# Patient Record
Sex: Male | Born: 1945
Health system: Southern US, Community
[De-identification: ages and names within clinical notes are randomized; demographics above are authoritative.]

## PROBLEM LIST (undated history)

## (undated) DIAGNOSIS — F1020 Alcohol dependence, uncomplicated: Secondary | ICD-10-CM

## (undated) DIAGNOSIS — F1290 Cannabis use, unspecified, uncomplicated: Secondary | ICD-10-CM

## (undated) DIAGNOSIS — G4730 Sleep apnea, unspecified: Secondary | ICD-10-CM

## (undated) DIAGNOSIS — I10 Essential (primary) hypertension: Secondary | ICD-10-CM

## (undated) DIAGNOSIS — G4733 Obstructive sleep apnea (adult) (pediatric): Secondary | ICD-10-CM

## (undated) DIAGNOSIS — N529 Male erectile dysfunction, unspecified: Secondary | ICD-10-CM

## (undated) DIAGNOSIS — E785 Hyperlipidemia, unspecified: Secondary | ICD-10-CM

## (undated) DIAGNOSIS — M1990 Unspecified osteoarthritis, unspecified site: Secondary | ICD-10-CM

## (undated) HISTORY — DX: Male erectile dysfunction, unspecified: N52.9

## (undated) HISTORY — PX: APPENDECTOMY: SHX54

## (undated) HISTORY — DX: Sleep apnea, unspecified: G47.30

## (undated) HISTORY — DX: Hyperlipidemia, unspecified: E78.5

## (undated) HISTORY — PX: FRACTURE SURGERY: SHX138

## (undated) HISTORY — DX: Unspecified osteoarthritis, unspecified site: M19.90

## (undated) HISTORY — PX: OTHER SURGICAL HISTORY: SHX169

## (undated) HISTORY — DX: Obstructive sleep apnea (adult) (pediatric): G47.33

## (undated) HISTORY — DX: Essential (primary) hypertension: I10

---
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
## 2015-02-01 ENCOUNTER — Other Ambulatory Visit: Payer: Self-pay | Admitting: Family Medicine

## 2015-03-11 ENCOUNTER — Ambulatory Visit: Payer: Self-pay | Admitting: Family Medicine

## 2015-03-17 DIAGNOSIS — I10 Essential (primary) hypertension: Secondary | ICD-10-CM | POA: Insufficient documentation

## 2015-03-17 DIAGNOSIS — E785 Hyperlipidemia, unspecified: Secondary | ICD-10-CM | POA: Insufficient documentation

## 2015-03-17 DIAGNOSIS — N529 Male erectile dysfunction, unspecified: Secondary | ICD-10-CM | POA: Insufficient documentation

## 2015-03-17 DIAGNOSIS — E782 Mixed hyperlipidemia: Secondary | ICD-10-CM | POA: Insufficient documentation

## 2015-03-21 ENCOUNTER — Encounter: Payer: Self-pay | Admitting: Family Medicine

## 2015-03-21 ENCOUNTER — Ambulatory Visit (INDEPENDENT_AMBULATORY_CARE_PROVIDER_SITE_OTHER): Payer: 59 | Admitting: Family Medicine

## 2015-03-21 VITALS — BP 129/78 | HR 78 | Temp 98.0°F | Ht 70.0 in | Wt 208.0 lb

## 2015-03-21 DIAGNOSIS — N529 Male erectile dysfunction, unspecified: Secondary | ICD-10-CM

## 2015-03-21 DIAGNOSIS — M79604 Pain in right leg: Secondary | ICD-10-CM | POA: Diagnosis not present

## 2015-03-21 DIAGNOSIS — E869 Volume depletion, unspecified: Secondary | ICD-10-CM

## 2015-03-21 DIAGNOSIS — I10 Essential (primary) hypertension: Secondary | ICD-10-CM

## 2015-03-21 DIAGNOSIS — M79605 Pain in left leg: Secondary | ICD-10-CM | POA: Diagnosis not present

## 2015-03-21 DIAGNOSIS — I951 Orthostatic hypotension: Secondary | ICD-10-CM

## 2015-03-21 DIAGNOSIS — E785 Hyperlipidemia, unspecified: Secondary | ICD-10-CM

## 2015-03-21 MED ORDER — TADALAFIL 20 MG PO TABS: ORAL_TABLET | ORAL | Status: DC

## 2015-03-21 MED ORDER — TADALAFIL 5 MG PO TABS: 5.0000 mg | ORAL_TABLET | Freq: Every day | ORAL | Status: DC | PRN

## 2015-03-21 NOTE — Progress Notes (Signed)
BP 129/78 mmHg  Pulse 78  Temp(Src) 98 F (36.7 C)  Ht 5\' 10"  (1.778 m)  Wt 208 lb (94.348 kg)  BMI 29.84 kg/m2  SpO2 96%   Subjective:    Patient ID: James Medina, male    DOB: Jan 14, 1946, 69 y.o.   MRN: [id number]  HPI: James Medina is a 69 y.o. male  Chief Complaint  Patient presents with  . Leg Pain    getting worse. Bilateral legs. Somedays hurts so bad he can't walk. Would like a referral.   He is having leg pain; on and off for a year; some days it hurts so bad that he can't walk Hurts in the calves Exercise actually makes it worse Sitting still makes it feel better Drives to work, about 30 minutes later, tries to get out of the car and feels pain Right leg is worse than the left He is taking the other medicine for his joints, diclofenac He says the diclofenac eases the pain off a little Sometimes it is a sharp pain, fleeting; sometimes when trying to walk, it's not sharp, just painful Has been so signficant he had to leave work one day Massage does not make it feel better at all Patient has not felt a bad cramp in his calf since childhood He actually stopped the statin, thinking that the medicine might be the cause and it really didn't help when the medicine was stopped  Father gets cramps in his legs; vinegar helps his dad  He has high cholesterol  His blood pressure, but felt dizzy in his shop a few weeks ago it was 83/43, went up to 87/50 something; no chance of taking an extra dose of BP medicine; no fevers or chills, but did break out in a sweat; no chest pain; no funny heart rhythm; no stroke-like symptoms; not happened again; held BP meds for a few days and did fine  Relevant past medical, surgical, family and social history reviewed and updated as indicated. Interim medical history since our last visit reviewed. Allergies and medications reviewed and updated. He used to smoke, quit cigars a month ago  He lost some weight being hot and outside these  last few months  Review of Systems Per HPI unless specifically indicated above     Objective:    BP 129/78 mmHg  Pulse 78  Temp(Src) 98 F (36.7 C)  Ht 5\' 10"  (1.778 m)  Wt 208 lb (94.348 kg)  BMI 29.84 kg/m2  SpO2 96%  Wt Readings from Last 3 Encounters:  03/21/15 208 lb (94.348 kg)  11/01/14 217 lb (98.431 kg)    Physical Exam  Constitutional: He appears well-developed and well-nourished.  HENT:  Head: Normocephalic and atraumatic.  Eyes: EOM are normal.  Cardiovascular: Normal rate and regular rhythm.   Pulses:      Dorsalis pedis pulses are 1+ on the right side, and 1+ on the left side.       Posterior tibial pulses are 1+ on the right side, and 1+ on the left side.  Feet are warm and well-perfused  Pulmonary/Chest: Effort normal and breath sounds normal.  Musculoskeletal: He exhibits no edema.  Neurological: He is alert. He displays normal reflexes. He exhibits normal muscle tone.  No lower extremity weakness  Skin: Skin is warm and dry. No pallor.  Toenails are rather pale  Psychiatric: He has a normal mood and affect. His behavior is normal. Judgment and thought content normal.    No results found  for this or any previous visit.    Assessment & Plan:   Problem List Items Addressed This Visit      Cardiovascular and Mediastinum   Hypertension    Well-controlled today; continue ACE-I / thiazide combination; limit sodium      Relevant Medications   tadalafil (CIALIS) 20 MG tablet   tadalafil (CIALIS) 5 MG tablet     Genitourinary   ED (erectile dysfunction)    Rx for daily cialis OR he can use on-demand cialis, NOT BOTH; there is a savings card and he is welcome to use that for whichever one (the daily OR on-demand) is the least expensive option for him        Other   Hyperlipidemia    Patient to start back on his statin and recheck lipids and sgpt in 3 months; limit saturated fats      Relevant Medications   tadalafil (CIALIS) 20 MG tablet    tadalafil (CIALIS) 5 MG tablet   Bilateral leg pain - Primary    Discussed ddx with patient; I don't think this is neurologic or musculoskeletal; I suspect a vascular etiology; pulses found with hand-held doppler; will refer to vascular specialist to evaluate for possible venous etiology, though arterial insufficiency still a possibility given his ED and high cholesterol       Other Visit Diagnoses    Volume depletion        sounds as though patient had significant volume depletion episode a few weeks ago; asymptomatic now; encouraged adequate hydration    Orthostatic hypotension        a few weeks ago, encouraged adequate hydration    Relevant Medications    tadalafil (CIALIS) 20 MG tablet    tadalafil (CIALIS) 5 MG tablet        Follow up plan: No Follow-up on file.

## 2015-03-21 NOTE — Patient Instructions (Addendum)
We'll refer you to the vascular doctor for evaluation of the leg pain Do consider going back on half of your previous dose of the cholesterol medicine and then check fasting labs 3 months later along with liver enzyme Start back on 81 mg coated aspirin daily Healthy eating encouraged If you have not heard anything from my staff in a week about any orders/referrals/studies from today, please contact us here to follow-up [phone]

## 2015-03-21 NOTE — Assessment & Plan Note (Addendum)
Rx for daily cialis OR he can use on-demand cialis, NOT BOTH; there is a savings card and he is welcome to use that for whichever one (the daily OR on-demand) is the least expensive option for him

## 2015-03-25 NOTE — Assessment & Plan Note (Signed)
Patient to start back on his statin and recheck lipids and sgpt in 3 months; limit saturated fats

## 2015-03-25 NOTE — Assessment & Plan Note (Signed)
Discussed ddx with patient; I don't think this is neurologic or musculoskeletal; I suspect a vascular etiology; pulses found with hand-held doppler; will refer to vascular specialist to evaluate for possible venous etiology, though arterial insufficiency still a possibility given his ED and high cholesterol

## 2015-03-25 NOTE — Assessment & Plan Note (Signed)
Well-controlled today; continue ACE-I / thiazide combination; limit sodium

## 2015-04-01 ENCOUNTER — Telehealth: Payer: Self-pay | Admitting: Family Medicine

## 2015-04-01 NOTE — Telephone Encounter (Signed)
Pt calling about vascular appointment, said he was told to call back within a week if he had not heard anything.  Please call him.

## 2015-04-05 ENCOUNTER — Other Ambulatory Visit: Payer: Self-pay | Admitting: Family Medicine

## 2015-04-05 NOTE — Telephone Encounter (Signed)
May 2016 labs reviewed; GFR 97, K+ 3.8; Rx approved

## 2015-04-21 ENCOUNTER — Telehealth: Payer: Self-pay | Admitting: Family Medicine

## 2015-04-21 DIAGNOSIS — M79604 Pain in right leg: Secondary | ICD-10-CM

## 2015-04-21 DIAGNOSIS — M79605 Pain in left leg: Secondary | ICD-10-CM

## 2015-04-21 MED ORDER — TRAMADOL HCL 50 MG PO TABS: 50.0000 mg | ORAL_TABLET | Freq: Four times a day (QID) | ORAL | Status: DC | PRN

## 2015-04-21 NOTE — Assessment & Plan Note (Signed)
Refer to vascular 

## 2015-04-21 NOTE — Telephone Encounter (Signed)
I talked with the patient; I apologized for the delay in the vascular surgery referral; I take full responsibility and have talked with my staff about the fail-safe mechanism which did not work with him; I do appreciate that he called on October 11th, but the message never came to me and it wasn't acted on; I will have further discussion with staff I very much want to get him in to see vascular doctor; if symptoms from arterial issue or venous issue, those are treated differently; if not arterial or venous, then we have to keep looking, spinal stenosis, other problems; he agrees I offered pain medicine; will try tramadol; he may have some at home that his dog was prescribed; I can NOT authorize for him to use that, and will call in Rx right now so he can pick this up tonight; he agrees

## 2015-04-21 NOTE — Telephone Encounter (Signed)
I called, left msg for patient

## 2015-04-21 NOTE — Telephone Encounter (Signed)
Good Morning Dr Sanda Klein  Mr James Medina called this morning again and expressed that he was supposed to be scheduled for an u/s of his leg. So read his note from 03/21/15 and you did stated that information in his note however I did not received an order nor referral to complete his referral. Just wanted to let you know. So I have placed the order in the box to be signed for his ultrasound just choose which request you would like and sign at the bottom. Once I get it I will make sure I call and go ahead and get him schedule since it has been a month. You may have forgotten to sign the order itself because you definitely documented in your notes that you were going to order the exam.

## 2015-05-28 ENCOUNTER — Telehealth: Payer: Self-pay

## 2015-05-29 ENCOUNTER — Other Ambulatory Visit: Payer: Self-pay | Admitting: Family Medicine

## 2015-05-29 DIAGNOSIS — E785 Hyperlipidemia, unspecified: Secondary | ICD-10-CM

## 2015-05-29 DIAGNOSIS — Z5181 Encounter for therapeutic drug level monitoring: Secondary | ICD-10-CM

## 2015-05-29 DIAGNOSIS — Z125 Encounter for screening for malignant neoplasm of prostate: Secondary | ICD-10-CM

## 2015-05-29 NOTE — Telephone Encounter (Signed)
rx

## 2015-05-29 NOTE — Telephone Encounter (Signed)
done

## 2015-05-30 DIAGNOSIS — Z5181 Encounter for therapeutic drug level monitoring: Secondary | ICD-10-CM | POA: Insufficient documentation

## 2015-05-30 DIAGNOSIS — Z125 Encounter for screening for malignant neoplasm of prostate: Secondary | ICD-10-CM | POA: Insufficient documentation

## 2015-05-30 NOTE — Assessment & Plan Note (Signed)
Check labs 

## 2015-05-30 NOTE — Telephone Encounter (Signed)
I have sent refill as requested I put in fasting lab orders for patient Please ask patient to come in some time in the next week or two and we'll get those labs done for him before the end of the year One lab I ordered is a PSA; he is welcome to have that done or decline it (you can cancel if he declines it); it screens for prostate cancer

## 2015-05-30 NOTE — Telephone Encounter (Signed)
Patient's wife notified to remind him to come in for labs.

## 2015-06-06 ENCOUNTER — Other Ambulatory Visit: Payer: 59

## 2015-06-06 DIAGNOSIS — Z5181 Encounter for therapeutic drug level monitoring: Secondary | ICD-10-CM

## 2015-06-06 DIAGNOSIS — E785 Hyperlipidemia, unspecified: Secondary | ICD-10-CM

## 2015-06-06 DIAGNOSIS — Z125 Encounter for screening for malignant neoplasm of prostate: Secondary | ICD-10-CM

## 2015-06-07 LAB — COMPREHENSIVE METABOLIC PANEL
ALBUMIN: 4.3 g/dL (ref 3.6–4.8)
ALK PHOS: 91 IU/L (ref 39–117)
ALT: 23 IU/L (ref 0–44)
AST: 27 IU/L (ref 0–40)
Albumin/Globulin Ratio: 1.8 (ref 1.1–2.5)
BUN / CREAT RATIO: 16 (ref 10–22)
BUN: 14 mg/dL (ref 8–27)
Bilirubin Total: 0.9 mg/dL (ref 0.0–1.2)
CO2: 25 mmol/L (ref 18–29)
CREATININE: 0.85 mg/dL (ref 0.76–1.27)
Calcium: 9.2 mg/dL (ref 8.6–10.2)
Chloride: 102 mmol/L (ref 96–106)
GFR calc non Af Amer: 90 mL/min/{1.73_m2} (ref >59)
GFR, EST AFRICAN AMERICAN: 104 mL/min/{1.73_m2} (ref >59)
GLUCOSE: 127 mg/dL — AB (ref 65–99)
Globulin, Total: 2.4 g/dL (ref 1.5–4.5)
Potassium: 4.2 mmol/L (ref 3.5–5.2)
Sodium: 142 mmol/L (ref 134–144)
TOTAL PROTEIN: 6.7 g/dL (ref 6.0–8.5)

## 2015-06-07 LAB — CBC WITH DIFFERENTIAL/PLATELET
Basophils Absolute: 0 10*3/uL (ref 0.0–0.2)
Basos: 1 %
EOS (ABSOLUTE): 0.1 10*3/uL (ref 0.0–0.4)
EOS: 2 %
HEMATOCRIT: 42.8 % (ref 37.5–51.0)
HEMOGLOBIN: 14.8 g/dL (ref 12.6–17.7)
IMMATURE GRANS (ABS): 0 10*3/uL (ref 0.0–0.1)
Immature Granulocytes: 0 %
LYMPHS ABS: 1.4 10*3/uL (ref 0.7–3.1)
LYMPHS: 25 %
MCH: 33.5 pg — ABNORMAL HIGH (ref 26.6–33.0)
MCHC: 34.6 g/dL (ref 31.5–35.7)
MCV: 97 fL (ref 79–97)
MONOCYTES: 6 %
Monocytes Absolute: 0.3 10*3/uL (ref 0.1–0.9)
Neutrophils Absolute: 3.6 10*3/uL (ref 1.4–7.0)
Neutrophils: 66 %
Platelets: 240 10*3/uL (ref 150–379)
RBC: 4.42 x10E6/uL (ref 4.14–5.80)
RDW: 12.3 % (ref 12.3–15.4)
WBC: 5.4 10*3/uL (ref 3.4–10.8)

## 2015-06-07 LAB — PSA: Prostate Specific Ag, Serum: 0.9 ng/mL (ref 0.0–4.0)

## 2015-06-07 LAB — LIPID PANEL W/O CHOL/HDL RATIO
Cholesterol, Total: 160 mg/dL (ref 100–199)
HDL: 89 mg/dL (ref >39)
LDL CALC: 61 mg/dL (ref 0–99)
TRIGLYCERIDES: 49 mg/dL (ref 0–149)
VLDL CHOLESTEROL CAL: 10 mg/dL (ref 5–40)

## 2015-06-08 ENCOUNTER — Other Ambulatory Visit: Payer: Self-pay | Admitting: Family Medicine

## 2015-06-08 ENCOUNTER — Encounter: Payer: Self-pay | Admitting: Family Medicine

## 2015-06-08 DIAGNOSIS — R7989 Other specified abnormal findings of blood chemistry: Secondary | ICD-10-CM

## 2015-06-08 DIAGNOSIS — R739 Hyperglycemia, unspecified: Secondary | ICD-10-CM

## 2015-06-08 NOTE — Assessment & Plan Note (Signed)
127 in December 2016; recheck fasting in 2-3 weeks; see letter dated Jun 08, 2015

## 2015-06-08 NOTE — Assessment & Plan Note (Signed)
Elevated MCH; recheck CBC in 3 months (mid-March 2017); may be alcohol effect; if still elevated, check 123456, folic acid, UPEP, SPEP

## 2015-06-08 NOTE — Assessment & Plan Note (Signed)
Recheck glucose fasting with A1c in late Dec 2016 or early Jan 2017

## 2015-06-08 NOTE — Assessment & Plan Note (Signed)
Check CBC in mid-March 2017

## 2015-06-09 ENCOUNTER — Other Ambulatory Visit: Payer: Self-pay

## 2015-06-09 MED ORDER — DICLOFENAC SODIUM 50 MG PO TBEC: DELAYED_RELEASE_TABLET | ORAL | Status: DC

## 2015-06-09 NOTE — Telephone Encounter (Signed)
Routing to provider  

## 2015-06-09 NOTE — Telephone Encounter (Signed)
Jun 06, 2015 labs reviewed; rx approved

## 2015-06-26 ENCOUNTER — Other Ambulatory Visit: Payer: Self-pay | Admitting: Family Medicine

## 2015-06-26 NOTE — Telephone Encounter (Signed)
Jun 06, 2015 labs reviewed (lipids and SGPT); Rx approved

## 2015-07-30 ENCOUNTER — Other Ambulatory Visit: Payer: Self-pay | Admitting: Family Medicine

## 2015-07-30 NOTE — Telephone Encounter (Signed)
Dec 2016 labs reviewed; Rx approved; last BP reviewed as well

## 2015-08-29 ENCOUNTER — Ambulatory Visit: Payer: 59 | Admitting: Family Medicine

## 2015-09-05 ENCOUNTER — Ambulatory Visit (INDEPENDENT_AMBULATORY_CARE_PROVIDER_SITE_OTHER): Payer: 59 | Admitting: Family Medicine

## 2015-09-05 ENCOUNTER — Encounter: Payer: Self-pay | Admitting: Family Medicine

## 2015-09-05 VITALS — BP 122/73 | HR 87 | Temp 99.1°F | Ht 70.5 in | Wt 206.0 lb

## 2015-09-05 DIAGNOSIS — R361 Hematospermia: Secondary | ICD-10-CM | POA: Diagnosis not present

## 2015-09-05 DIAGNOSIS — Z789 Other specified health status: Secondary | ICD-10-CM

## 2015-09-05 DIAGNOSIS — F1090 Alcohol use, unspecified, uncomplicated: Secondary | ICD-10-CM

## 2015-09-05 DIAGNOSIS — Z5181 Encounter for therapeutic drug level monitoring: Secondary | ICD-10-CM

## 2015-09-05 DIAGNOSIS — E785 Hyperlipidemia, unspecified: Secondary | ICD-10-CM | POA: Diagnosis not present

## 2015-09-05 DIAGNOSIS — R310 Gross hematuria: Secondary | ICD-10-CM | POA: Diagnosis not present

## 2015-09-05 DIAGNOSIS — I10 Essential (primary) hypertension: Secondary | ICD-10-CM

## 2015-09-05 DIAGNOSIS — R7989 Other specified abnormal findings of blood chemistry: Secondary | ICD-10-CM

## 2015-09-05 DIAGNOSIS — R739 Hyperglycemia, unspecified: Secondary | ICD-10-CM

## 2015-09-05 DIAGNOSIS — R252 Cramp and spasm: Secondary | ICD-10-CM

## 2015-09-05 DIAGNOSIS — Z1159 Encounter for screening for other viral diseases: Secondary | ICD-10-CM

## 2015-09-05 DIAGNOSIS — M79604 Pain in right leg: Secondary | ICD-10-CM

## 2015-09-05 DIAGNOSIS — M79605 Pain in left leg: Secondary | ICD-10-CM | POA: Diagnosis not present

## 2015-09-05 DIAGNOSIS — Z7289 Other problems related to lifestyle: Secondary | ICD-10-CM

## 2015-09-05 LAB — UA/M W/RFLX CULTURE, ROUTINE
Bilirubin, UA: NEGATIVE
Glucose, UA: NEGATIVE
Ketones, UA: NEGATIVE
Leukocytes, UA: NEGATIVE
NITRITE UA: NEGATIVE
PH UA: 7 (ref 5.0–7.5)
Protein, UA: NEGATIVE
Specific Gravity, UA: 1.02 (ref 1.005–1.030)
UUROB: 0.2 mg/dL (ref 0.2–1.0)

## 2015-09-05 LAB — MICROSCOPIC EXAMINATION
EPITHELIAL CELLS (NON RENAL): NONE SEEN /HPF (ref 0–10)
WBC UA: NONE SEEN /HPF (ref 0–?)

## 2015-09-05 NOTE — Assessment & Plan Note (Signed)
Recheck CBC. 

## 2015-09-05 NOTE — Progress Notes (Signed)
BP 122/73 mmHg  Pulse 87  Temp(Src) 99.1 F (37.3 C)  Ht 5' 10.5" (1.791 m)  Wt 206 lb (93.441 kg)  BMI 29.13 kg/m2  SpO2 98%   Subjective:    Patient ID: James Medina, male    DOB: 05-01-1946, 70 y.o.   MRN: [id number]  HPI: [name] is a 70 y.o. male  Chief Complaint  Patient presents with  . Hypertension    follow up and labs  . Hyperlipidemia    follow up and labs  . Leg Pain    bilateral, he went to Dr.Dew and he said circulation was fine but it's still hurting him    He wants a good orthopaedic surgeon to check his legs; vascular surgeon thought his circulation was fine; when the pain starts behind the knee and goes down the legs; both sides; sometimes just one leg, sometimes both; used to cross his legs a lot, but not much anymore; feet don't get cold; just some pain on the left side down low, lasted about a week, just one spot; might have seen some blood; brothers and father have kidney stones; legs do feel heavy when walking for a long time; left knee is "bad", pops on him sometimes; played baseball and pitched; knot in the right wrist; legs bother him at night; the tramdol made him itch so he stopped taking that; Dr. Lucky Cowboy gave him hydrocodone which helped; left calf really knotted up the other night; only one really bad Charley horse in the right calf once, so painful he could hardly scream  Just saw the one time blood in urine recently; has had hematospermia over last few years; had vasectomy 22 years ago, but blood in ejaculate just started a few years ago; no pain with urination or ejaculation; no pain in straddle  High cholesterol; tries to limit fats, does eat sausage egg and cheese biscuit sometimes though; at home, very rare meats, wife is vegan  High blood pressure; controlled; checks at home and it's 120-130 on top; no added salt  Alcohol, 6-12 pack a day, but not every day; spreading alcohol out, back off when high; no abdominal pain, no  jaundice  Relevant past medical, surgical, family and social history reviewed and updated as indicated. Interim medical history since our last visit reviewed. Allergies and medications reviewed and updated.  Review of Systems Per HPI unless specifically indicated above     Objective:    BP 122/73 mmHg  Pulse 87  Temp(Src) 99.1 F (37.3 C)  Ht 5' 10.5" (1.791 m)  Wt 206 lb (93.441 kg)  BMI 29.13 kg/m2  SpO2 98%  Wt Readings from Last 3 Encounters:  09/05/15 206 lb (93.441 kg)  03/21/15 208 lb (94.348 kg)  11/01/14 217 lb (98.431 kg)    Physical Exam  Constitutional: He appears well-developed and well-nourished. No distress.  overweight  HENT:  Head: Normocephalic and atraumatic.  Eyes: EOM are normal. No scleral icterus.  Neck: No thyromegaly present.  Cardiovascular: Normal rate and regular rhythm.   Pulmonary/Chest: Effort normal and breath sounds normal.  Abdominal: Soft. Bowel sounds are normal. He exhibits no distension and no mass. There is no hepatomegaly. There is no tenderness.  Musculoskeletal: He exhibits no edema.  Neurological: Coordination normal.  Skin: Skin is warm and dry. No pallor.  Ruddy complexion, some dilated capillaries on face  Psychiatric: He has a normal mood and affect. His behavior is normal. Judgment and thought content normal.  Results for orders placed or performed in visit on 06/06/15  PSA  Result Value Ref Range   Prostate Specific Ag, Serum 0.9 0.0 - 4.0 ng/mL  Comprehensive metabolic panel  Result Value Ref Range   Glucose 127 (H) 65 - 99 mg/dL   BUN 14 8 - 27 mg/dL   Creatinine, Ser 0.85 0.76 - 1.27 mg/dL   GFR calc non Af Amer 90 >59 mL/min/1.73   GFR calc Af Amer 104 >59 mL/min/1.73   BUN/Creatinine Ratio 16 10 - 22   Sodium 142 134 - 144 mmol/L   Potassium 4.2 3.5 - 5.2 mmol/L   Chloride 102 96 - 106 mmol/L   CO2 25 18 - 29 mmol/L   Calcium 9.2 8.6 - 10.2 mg/dL   Total Protein 6.7 6.0 - 8.5 g/dL   Albumin 4.3 3.6 -  4.8 g/dL   Globulin, Total 2.4 1.5 - 4.5 g/dL   Albumin/Globulin Ratio 1.8 1.1 - 2.5   Bilirubin Total 0.9 0.0 - 1.2 mg/dL   Alkaline Phosphatase 91 39 - 117 IU/L   AST 27 0 - 40 IU/L   ALT 23 0 - 44 IU/L  CBC with Differential/Platelet  Result Value Ref Range   WBC 5.4 3.4 - 10.8 x10E3/uL   RBC 4.42 4.14 - 5.80 x10E6/uL   Hemoglobin 14.8 12.6 - 17.7 g/dL   Hematocrit 42.8 37.5 - 51.0 %   MCV 97 79 - 97 fL   MCH 33.5 (H) 26.6 - 33.0 pg   MCHC 34.6 31.5 - 35.7 g/dL   RDW 12.3 12.3 - 15.4 %   Platelets 240 150 - 379 x10E3/uL   Neutrophils 66 %   Lymphs 25 %   Monocytes 6 %   Eos 2 %   Basos 1 %   Neutrophils Absolute 3.6 1.4 - 7.0 x10E3/uL   Lymphocytes Absolute 1.4 0.7 - 3.1 x10E3/uL   Monocytes Absolute 0.3 0.1 - 0.9 x10E3/uL   EOS (ABSOLUTE) 0.1 0.0 - 0.4 x10E3/uL   Basophils Absolute 0.0 0.0 - 0.2 x10E3/uL   Immature Granulocytes 0 %   Immature Grans (Abs) 0.0 0.0 - 0.1 x10E3/uL  Lipid Panel w/o Chol/HDL Ratio  Result Value Ref Range   Cholesterol, Total 160 100 - 199 mg/dL   Triglycerides 49 0 - 149 mg/dL   HDL 89 >39 mg/dL   VLDL Cholesterol Cal 10 5 - 40 mg/dL   LDL Calculated 61 0 - 99 mg/dL      Assessment & Plan:   Problem List Items Addressed This Visit      Cardiovascular and Mediastinum   Hypertension    Well-controlled today; continue medicine        Genitourinary   Hematuria, gross    Check urine today; refer to urologist      Relevant Orders   Ambulatory referral to Urology   UA/M w/rflx Culture, Routine     Other   Hyperlipidemia - Primary    Check fasting lipids; limit saturated fats      Relevant Orders   Lipid Panel w/o Chol/HDL Ratio   Bilateral leg pain    Patient requests referral to ortho; may have knee OA, may have lower back issues      Relevant Orders   Ambulatory referral to Orthopedic Surgery   Medication monitoring encounter    Check liver and kidneys      Relevant Orders   Comprehensive metabolic panel    Abnormal CBC    Recheck CBC  Relevant Orders   CBC with Differential/Platelet   Hyperglycemia    Check A1c and glucose today      Relevant Orders   Hgb A1c w/o eAG   Hematospermia   Relevant Orders   Ambulatory referral to Urology   Alcohol use ([place])    See AVS; encouraged him to cut back; no more than 14 drinks per week; showed him [email or web address]; I am here to help if needed      Relevant Orders   Vitamin B12   Leg cramps   Relevant Orders   Magnesium    Other Visit Diagnoses    Need for hepatitis C screening test        Relevant Orders    Hepatitis C Antibody        Follow up plan: Return in about 6 months (around 03/07/2016) for fasting labs and visit with doctor.   An after-visit summary was printed and given to the patient at [facility].  Please see the patient instructions which may contain other information and recommendations beyond what is mentioned above in the assessment and plan. Orders Placed This Encounter  Procedures  . Hepatitis C Antibody  . Lipid Panel w/o Chol/HDL Ratio  . Comprehensive metabolic panel  . Vitamin B12  . CBC with Differential/Platelet  . UA/M w/rflx Culture, Routine  . Hgb A1c w/o eAG  . Magnesium  . Ambulatory referral to Orthopedic Surgery  . Ambulatory referral to Urology

## 2015-09-05 NOTE — Assessment & Plan Note (Signed)
Patient requests referral to ortho; may have knee OA, may have lower back issues

## 2015-09-05 NOTE — Assessment & Plan Note (Signed)
Check fasting lipids; limit saturated fats 

## 2015-09-05 NOTE — Assessment & Plan Note (Signed)
See AVS; encouraged him to cut back; no more than 14 drinks per week; showed him [email or web address]; I am here to help if needed

## 2015-09-05 NOTE — Assessment & Plan Note (Signed)
Check A1c and glucose today 

## 2015-09-05 NOTE — Assessment & Plan Note (Signed)
Check liver and kidneys 

## 2015-09-05 NOTE — Patient Instructions (Addendum)
Try tonic water 4 ounces every evening to see if that helps prevents the leg cramps Keep up the great job with your blood pressure Check out moderatedrinking.com I do recommend that you decrease your alcohol to no more than 14 drinks per week total; I am here to help if desired I also urge you to NEVER ever take pain medicine along with alcohol, as it can cause unintentional overdose and death Try turmeric as a natural anti-inflammatory (for pain and arthritis). It comes in capsules where you buy aspirin and fish oil, but also as a spice where you buy pepper and garlic powder. We'll check labs today I've put in a referral for the orthopaedist and the urologist If you have not heard anything from my staff in a week about any orders/referrals/studies from today, please contact us here to follow-up [phone] Try to limit saturated fats in your diet (bologna, hot dogs, barbeque, cheeseburgers, hamburgers, steak, bacon, sausage, cheese, etc.) and get more fresh fruits, vegetables, and whole grains

## 2015-09-05 NOTE — Assessment & Plan Note (Signed)
Check urine today; refer to urologist 

## 2015-09-05 NOTE — Assessment & Plan Note (Signed)
Well-controlled today; continue medicine 

## 2015-09-06 LAB — CBC WITH DIFFERENTIAL/PLATELET
BASOS: 0 %
Basophils Absolute: 0 10*3/uL (ref 0.0–0.2)
EOS (ABSOLUTE): 0.1 10*3/uL (ref 0.0–0.4)
EOS: 2 %
HEMATOCRIT: 44.4 % (ref 37.5–51.0)
HEMOGLOBIN: 15 g/dL (ref 12.6–17.7)
IMMATURE GRANULOCYTES: 0 %
Immature Grans (Abs): 0 10*3/uL (ref 0.0–0.1)
LYMPHS ABS: 1.6 10*3/uL (ref 0.7–3.1)
Lymphs: 23 %
MCH: 33.7 pg — ABNORMAL HIGH (ref 26.6–33.0)
MCHC: 33.8 g/dL (ref 31.5–35.7)
MCV: 100 fL — AB (ref 79–97)
MONOCYTES: 6 %
Monocytes Absolute: 0.4 10*3/uL (ref 0.1–0.9)
NEUTROS PCT: 69 %
Neutrophils Absolute: 4.7 10*3/uL (ref 1.4–7.0)
Platelets: 234 10*3/uL (ref 150–379)
RBC: 4.45 x10E6/uL (ref 4.14–5.80)
RDW: 12.9 % (ref 12.3–15.4)
WBC: 6.8 10*3/uL (ref 3.4–10.8)

## 2015-09-06 LAB — LIPID PANEL W/O CHOL/HDL RATIO
Cholesterol, Total: 187 mg/dL (ref 100–199)
HDL: 88 mg/dL (ref >39)
LDL Calculated: 73 mg/dL (ref 0–99)
Triglycerides: 131 mg/dL (ref 0–149)
VLDL CHOLESTEROL CAL: 26 mg/dL (ref 5–40)

## 2015-09-06 LAB — COMPREHENSIVE METABOLIC PANEL
ALT: 24 IU/L (ref 0–44)
AST: 35 IU/L (ref 0–40)
Albumin/Globulin Ratio: 1.8 (ref 1.2–2.2)
Albumin: 4.4 g/dL (ref 3.6–4.8)
Alkaline Phosphatase: 106 IU/L (ref 39–117)
BUN/Creatinine Ratio: 14 (ref 10–22)
BUN: 11 mg/dL (ref 8–27)
Bilirubin Total: 1.1 mg/dL (ref 0.0–1.2)
CALCIUM: 9.4 mg/dL (ref 8.6–10.2)
CO2: 24 mmol/L (ref 18–29)
CREATININE: 0.76 mg/dL (ref 0.76–1.27)
Chloride: 98 mmol/L (ref 96–106)
GFR calc Af Amer: 109 mL/min/{1.73_m2} (ref >59)
GFR, EST NON AFRICAN AMERICAN: 94 mL/min/{1.73_m2} (ref >59)
GLOBULIN, TOTAL: 2.5 g/dL (ref 1.5–4.5)
Glucose: 122 mg/dL — ABNORMAL HIGH (ref 65–99)
Potassium: 4.2 mmol/L (ref 3.5–5.2)
Sodium: 138 mmol/L (ref 134–144)
TOTAL PROTEIN: 6.9 g/dL (ref 6.0–8.5)

## 2015-09-06 LAB — VITAMIN B12: Vitamin B-12: 460 pg/mL (ref 211–946)

## 2015-09-06 LAB — HEPATITIS C ANTIBODY

## 2015-09-06 LAB — MAGNESIUM: MAGNESIUM: 2.2 mg/dL (ref 1.6–2.3)

## 2015-09-06 LAB — HGB A1C W/O EAG: Hgb A1c MFr Bld: 5.7 % — ABNORMAL HIGH (ref 4.8–5.6)

## 2015-09-16 ENCOUNTER — Encounter: Payer: Self-pay | Admitting: Family Medicine

## 2015-09-16 DIAGNOSIS — D7589 Other specified diseases of blood and blood-forming organs: Secondary | ICD-10-CM | POA: Insufficient documentation

## 2015-09-16 DIAGNOSIS — R7301 Impaired fasting glucose: Secondary | ICD-10-CM

## 2015-09-26 ENCOUNTER — Ambulatory Visit: Payer: 59

## 2015-11-29 ENCOUNTER — Other Ambulatory Visit: Payer: Self-pay | Admitting: Family Medicine

## 2015-11-30 NOTE — Telephone Encounter (Signed)
I am going to deny this for now; I'd like to see how much he's drinking, if alcohol use has gone down; Rx declined, note to pharmacist to have pt call the office; ask him to make appt when he calls if he is plannning on seeing me; if staying at [facility], please direct the Rx to [facility]

## 2015-12-01 ENCOUNTER — Other Ambulatory Visit: Payer: Self-pay

## 2015-12-01 NOTE — Telephone Encounter (Signed)
Please see 11/29/15 refill request; denied; find out if staying at [facility], if so, send refill request there If following me, please ask him to schedule appt

## 2015-12-02 NOTE — Telephone Encounter (Signed)
Pt is going to be coming here and states he will call back to schedule an appt.

## 2015-12-05 ENCOUNTER — Other Ambulatory Visit: Payer: Self-pay

## 2015-12-05 NOTE — Telephone Encounter (Signed)
I denied this on 11/29/15 I denied this on 12/01/15 I am going to deny this again

## 2015-12-11 DIAGNOSIS — S66911A Strain of unspecified muscle, fascia and tendon at wrist and hand level, right hand, initial encounter: Secondary | ICD-10-CM | POA: Diagnosis not present

## 2015-12-15 ENCOUNTER — Other Ambulatory Visit: Payer: Self-pay

## 2015-12-15 NOTE — Telephone Encounter (Signed)
I'm denying this again; see previous notes; needs appt

## 2015-12-18 DIAGNOSIS — S66911A Strain of unspecified muscle, fascia and tendon at wrist and hand level, right hand, initial encounter: Secondary | ICD-10-CM | POA: Diagnosis not present

## 2016-01-19 ENCOUNTER — Other Ambulatory Visit: Payer: Self-pay

## 2016-01-20 MED ORDER — BENAZEPRIL-HYDROCHLOROTHIAZIDE 20-12.5 MG PO TABS: 1.0000 | ORAL_TABLET | Freq: Every day | ORAL | 1 refills | Status: DC

## 2016-01-20 NOTE — Telephone Encounter (Signed)
Last Cr and -lytes reviewed from March; Rx approved

## 2016-03-17 ENCOUNTER — Telehealth: Payer: Self-pay | Admitting: Family Medicine

## 2016-03-17 NOTE — Telephone Encounter (Signed)
LMOM to inform pt to call the office and schedule an appt °

## 2016-03-17 NOTE — Telephone Encounter (Signed)
Patient is due for appt and fasting labs please I'll send one more refill

## 2016-04-09 ENCOUNTER — Other Ambulatory Visit: Payer: Self-pay | Admitting: Family Medicine

## 2016-04-09 NOTE — Telephone Encounter (Signed)
Patient has scheduled appoitment for 04-26-16. He will be out of Benazepril and meloxicam before his appointment. He is asking that you please send a refill to [facility]

## 2016-04-12 MED ORDER — BENAZEPRIL-HYDROCHLOROTHIAZIDE 20-12.5 MG PO TABS: 1.0000 | ORAL_TABLET | Freq: Every day | ORAL | 0 refills | Status: DC

## 2016-04-12 NOTE — Telephone Encounter (Signed)
He shouldn't be on meloxicam; last NSAID in our medicine list is diclofenac I reviewed med list since November 20, 2014 and don't see meloxicam having been prescribed We'll discuss at appointment I'll refill the BP med

## 2016-04-12 NOTE — Telephone Encounter (Signed)
I don't see meloxicam on current med list?

## 2016-04-12 NOTE — Telephone Encounter (Signed)
Left detailed voicemail

## 2016-04-26 ENCOUNTER — Encounter: Payer: Self-pay | Admitting: *Deleted

## 2016-04-26 ENCOUNTER — Encounter: Payer: Self-pay | Admitting: Family Medicine

## 2016-04-26 ENCOUNTER — Ambulatory Visit (INDEPENDENT_AMBULATORY_CARE_PROVIDER_SITE_OTHER): Payer: BLUE CROSS/BLUE SHIELD | Admitting: Family Medicine

## 2016-04-26 DIAGNOSIS — K429 Umbilical hernia without obstruction or gangrene: Secondary | ICD-10-CM

## 2016-04-26 DIAGNOSIS — R7301 Impaired fasting glucose: Secondary | ICD-10-CM

## 2016-04-26 DIAGNOSIS — R252 Cramp and spasm: Secondary | ICD-10-CM | POA: Diagnosis not present

## 2016-04-26 DIAGNOSIS — E782 Mixed hyperlipidemia: Secondary | ICD-10-CM | POA: Diagnosis not present

## 2016-04-26 DIAGNOSIS — N529 Male erectile dysfunction, unspecified: Secondary | ICD-10-CM | POA: Diagnosis not present

## 2016-04-26 DIAGNOSIS — R310 Gross hematuria: Secondary | ICD-10-CM

## 2016-04-26 DIAGNOSIS — I10 Essential (primary) hypertension: Secondary | ICD-10-CM

## 2016-04-26 DIAGNOSIS — Z789 Other specified health status: Secondary | ICD-10-CM | POA: Diagnosis not present

## 2016-04-26 DIAGNOSIS — D7589 Other specified diseases of blood and blood-forming organs: Secondary | ICD-10-CM | POA: Diagnosis not present

## 2016-04-26 DIAGNOSIS — Z7289 Other problems related to lifestyle: Secondary | ICD-10-CM

## 2016-04-26 LAB — COMPLETE METABOLIC PANEL WITH GFR
ALT: 21 U/L (ref 9–46)
AST: 34 U/L (ref 10–35)
Albumin: 4.4 g/dL (ref 3.6–5.1)
Alkaline Phosphatase: 81 U/L (ref 40–115)
BUN: 12 mg/dL (ref 7–25)
CO2: 28 mmol/L (ref 20–31)
CREATININE: 0.94 mg/dL (ref 0.70–1.25)
Calcium: 9.7 mg/dL (ref 8.6–10.3)
Chloride: 100 mmol/L (ref 98–110)
GFR, EST NON AFRICAN AMERICAN: 83 mL/min (ref >=60)
GLUCOSE: 111 mg/dL — AB (ref 65–99)
Potassium: 4.1 mmol/L (ref 3.5–5.3)
Sodium: 138 mmol/L (ref 135–146)
TOTAL PROTEIN: 7.2 g/dL (ref 6.1–8.1)
Total Bilirubin: 0.8 mg/dL (ref 0.2–1.2)

## 2016-04-26 LAB — CBC WITH DIFFERENTIAL/PLATELET
BASOS ABS: 0 {cells}/uL (ref 0–200)
Basophils Relative: 0 %
EOS ABS: 162 {cells}/uL (ref 15–500)
EOS PCT: 2 %
HCT: 51.4 % — ABNORMAL HIGH (ref 38.5–50.0)
HEMOGLOBIN: 17.8 g/dL — AB (ref 13.2–17.1)
LYMPHS ABS: 1377 {cells}/uL (ref 850–3900)
Lymphocytes Relative: 17 %
MCH: 33.8 pg — AB (ref 27.0–33.0)
MCHC: 34.6 g/dL (ref 32.0–36.0)
MCV: 97.5 fL (ref 80.0–100.0)
MONOS PCT: 6 %
MPV: 9.3 fL (ref 7.5–12.5)
Monocytes Absolute: 486 cells/uL (ref 200–950)
NEUTROS ABS: 6075 {cells}/uL (ref 1500–7800)
NEUTROS PCT: 75 %
Platelets: 232 10*3/uL (ref 140–400)
RBC: 5.27 MIL/uL (ref 4.20–5.80)
RDW: 13.6 % (ref 11.0–15.0)
WBC: 8.1 10*3/uL (ref 3.8–10.8)

## 2016-04-26 LAB — LIPID PANEL
CHOLESTEROL: 256 mg/dL — AB (ref <200)
HDL: 93 mg/dL (ref >40)
LDL CALC: 147 mg/dL — AB
TRIGLYCERIDES: 78 mg/dL (ref <150)
Total CHOL/HDL Ratio: 2.8 Ratio (ref <5.0)
VLDL: 16 mg/dL (ref <30)

## 2016-04-26 LAB — HEMOGLOBIN A1C
Hgb A1c MFr Bld: 5.7 % — ABNORMAL HIGH (ref <5.7)
Mean Plasma Glucose: 117 mg/dL

## 2016-04-26 MED ORDER — BENAZEPRIL-HYDROCHLOROTHIAZIDE 20-12.5 MG PO TABS: 1.0000 | ORAL_TABLET | Freq: Every day | ORAL | 6 refills | Status: DC

## 2016-04-26 MED ORDER — TADALAFIL 20 MG PO TABS: ORAL_TABLET | ORAL | 11 refills | Status: DC

## 2016-04-26 NOTE — Assessment & Plan Note (Signed)
Referred to urologist in March; recheck urine today

## 2016-04-26 NOTE — Assessment & Plan Note (Signed)
Check Mg2+ 

## 2016-04-26 NOTE — Patient Instructions (Addendum)
Avoid salt substitutes (anything that has "potassium chloride") Your goal blood pressure is less than 150 mmHg on top. Try to follow the DASH guidelines (DASH stands for Dietary Approaches to Stop Hypertension) Try to limit the sodium in your diet.  Ideally, consume less than 1.5 grams (less than 1,500mg ) per day. Do not add salt when cooking or at the table.  Check the sodium amount on labels when shopping, and choose items lower in sodium when given a choice. Avoid or limit foods that already contain a lot of sodium. Eat a diet rich in fruits and vegetables and whole grains. Try to limit saturated fats in your diet (bologna, hot dogs, barbeque, cheeseburgers, hamburgers, steak, bacon, sausage, cheese, etc.) and get more fresh fruits, vegetables, and whole grains Check out ModerateDrinking.com I recommend no more than 14 total drinks per week, and no more than 4 drinks on any one day if drinking occasionally Let me know if you have trouble cutting back We'll get labs today and contact you

## 2016-04-26 NOTE — Progress Notes (Signed)
BP 124/78   Pulse 95   Temp 98.1 F (36.7 C) (Oral)   Resp 16   Wt 219 lb (99.3 kg)   SpO2 95%   BMI 30.98 kg/m   124/78 Subjective:    Patient ID: James Medina, male    DOB: 17-Feb-1946, 70 y.o.   MRN: [id number]  HPI: James Medina is a 70 y.o. male  Chief Complaint  Patient presents with  . Medication Refill  patient is here for f/u Refills of medicines are needed He feels "great" He went to see the orthopaedist, Dr. Carlynn Spry; he gave him a shot in the knees and one in the wrist; he went back for the right wrist, he was breaking a bolt loose and the hand went down and bent back and tore tendons; wore brace for 3 months; still has swelling; messed it again 2x since then, just bending it; ortho gave him tramadol which helped; sleeps with hand under pillow  HTN; he checks BP away from doctor, once a week, 120s over 60s; on ACE-thiazide; does not eat salt  High cholesterol; he stopped the statin; it made his muscle sore, couldn't sleep; eating pretty well, mostly a vegan diet; has sausage egg cheese biscuit at work away from wife (she is the vegan); not much cheese; he has been off of the statin for months; he stopped med and then restarted and sx restarted  ED; some decreased drive  Prediabetes; fasting today; brother has diabetes, on insulin  Still drinking alcohol, maybe 24 drinks a weeks; he can cut back if he wants, not hard if he wanted to; no shaking; wife says he is an alcoholic  PVD; he saw Dr. Lucky Cowboy for this, got checked out; occasional cramps when relaxing, but better than before, not with exercise  He has an umbilical hernia, 4-5 months; did not know it was there; pushes it back in  Depression screen [facility] 2/9 04/26/2016  Decreased Interest 0  Down, Depressed, Hopeless 0  PHQ - 2 Score 0    No flowsheet data found.  Relevant past medical, surgical, family and social history reviewed Past Medical History:  Diagnosis Date  . ED (erectile dysfunction)     . Hyperlipidemia   . Hypertension   . Impotence   . OA (osteoarthritis)    hand   Past Surgical History:  Procedure Laterality Date  . APPENDECTOMY    . broken ankle     Family History  Problem Relation Age of Onset  . Learning disabilities Maternal Uncle   . Cancer Mother     leukemia  . Heart disease Father   . Diabetes Brother   . Stroke Brother   . Diabetes Brother   . COPD Neg Hx   . Hypertension Neg Hx    Social History  Substance Use Topics  . Smoking status: Current Some Day Smoker    Types: Cigars  . Smokeless tobacco: Never Used  . Alcohol use 8.4 oz/week    14 Cans of beer per week     Comment: 2 beers a night    Interim medical history since last visit reviewed. Allergies and medications reviewed  Review of Systems  Cardiovascular: Negative for chest pain.  Gastrointestinal: Negative for abdominal pain.  Hematological: Does not bruise/bleed easily.  Per HPI unless specifically indicated above     Objective:    BP 124/78   Pulse 95   Temp 98.1 F (36.7 C) (Oral)   Resp 16  Wt 219 lb (99.3 kg)   SpO2 95%   BMI 30.98 kg/m   Wt Readings from Last 3 Encounters:  04/26/16 219 lb (99.3 kg)  09/05/15 206 lb (93.4 kg)  03/21/15 208 lb (94.3 kg)    Physical Exam  Constitutional: He appears well-developed and well-nourished. No distress.  HENT:  Head: Normocephalic and atraumatic.  Eyes: EOM are normal. No scleral icterus.  Neck: No thyromegaly present.  Cardiovascular: Normal rate and regular rhythm.   Pulmonary/Chest: Effort normal and breath sounds normal.  Abdominal: Soft. Normal appearance and bowel sounds are normal. He exhibits no distension. A hernia (umbilical) is present.  Musculoskeletal: He exhibits no edema.       Right hand: He exhibits swelling (along base of right radial wrist).  Neurological: Coordination normal.  Skin: Skin is warm and dry. No pallor.  Ruddy, plethoric complexion  Psychiatric: He has a normal mood and  affect. His behavior is normal. Judgment and thought content normal.   Results for orders placed or performed in visit on 09/05/15  Microscopic Examination  Result Value Ref Range   WBC, UA None seen 0 - 5 /hpf   RBC, UA 0-2 0 - 2 /hpf   Epithelial Cells (non renal) None seen 0 - 10 /hpf   Bacteria, UA Few None seen/Few  Hepatitis C Antibody  Result Value Ref Range   Hep C Virus Ab <0.1 0.0 - 0.9 s/co ratio  Lipid Panel w/o Chol/HDL Ratio  Result Value Ref Range   Cholesterol, Total 187 100 - 199 mg/dL   Triglycerides 131 0 - 149 mg/dL   HDL 88 >39 mg/dL   VLDL Cholesterol Cal 26 5 - 40 mg/dL   LDL Calculated 73 0 - 99 mg/dL  Comprehensive metabolic panel  Result Value Ref Range   Glucose 122 (H) 65 - 99 mg/dL   BUN 11 8 - 27 mg/dL   Creatinine, Ser 0.76 0.76 - 1.27 mg/dL   GFR calc non Af Amer 94 >59 mL/min/1.73   GFR calc Af Amer 109 >59 mL/min/1.73   BUN/Creatinine Ratio 14 10 - 22   Sodium 138 134 - 144 mmol/L   Potassium 4.2 3.5 - 5.2 mmol/L   Chloride 98 96 - 106 mmol/L   CO2 24 18 - 29 mmol/L   Calcium 9.4 8.6 - 10.2 mg/dL   Total Protein 6.9 6.0 - 8.5 g/dL   Albumin 4.4 3.6 - 4.8 g/dL   Globulin, Total 2.5 1.5 - 4.5 g/dL   Albumin/Globulin Ratio 1.8 1.2 - 2.2   Bilirubin Total 1.1 0.0 - 1.2 mg/dL   Alkaline Phosphatase 106 39 - 117 IU/L   AST 35 0 - 40 IU/L   ALT 24 0 - 44 IU/L  Vitamin B12  Result Value Ref Range   Vitamin B-12 460 211 - 946 pg/mL  CBC with Differential/Platelet  Result Value Ref Range   WBC 6.8 3.4 - 10.8 x10E3/uL   RBC 4.45 4.14 - 5.80 x10E6/uL   Hemoglobin 15.0 12.6 - 17.7 g/dL   Hematocrit 44.4 37.5 - 51.0 %   MCV 100 (H) 79 - 97 fL   MCH 33.7 (H) 26.6 - 33.0 pg   MCHC 33.8 31.5 - 35.7 g/dL   RDW 12.9 12.3 - 15.4 %   Platelets 234 150 - 379 x10E3/uL   Neutrophils 69 %   Lymphs 23 %   Monocytes 6 %   Eos 2 %   Basos 0 %   Neutrophils Absolute 4.7  1.4 - 7.0 x10E3/uL   Lymphocytes Absolute 1.6 0.7 - 3.1 x10E3/uL   Monocytes  Absolute 0.4 0.1 - 0.9 x10E3/uL   EOS (ABSOLUTE) 0.1 0.0 - 0.4 x10E3/uL   Basophils Absolute 0.0 0.0 - 0.2 x10E3/uL   Immature Granulocytes 0 %   Immature Grans (Abs) 0.0 0.0 - 0.1 x10E3/uL  UA/M w/rflx Culture, Routine  Result Value Ref Range   Specific Gravity, UA 1.020 1.005 - 1.030   pH, UA 7.0 5.0 - 7.5   Color, UA Yellow Yellow   Appearance Ur Clear Clear   Leukocytes, UA Negative Negative   Protein, UA Negative Negative/Trace   Glucose, UA Negative Negative   Ketones, UA Negative Negative   RBC, UA 1+ (A) Negative   Bilirubin, UA Negative Negative   Urobilinogen, Ur 0.2 0.2 - 1.0 mg/dL   Nitrite, UA Negative Negative   Microscopic Examination See below:   Hgb A1c w/o eAG  Result Value Ref Range   Hgb A1c MFr Bld 5.7 (H) 4.8 - 5.6 %  Magnesium  Result Value Ref Range   Magnesium 2.2 1.6 - 2.3 mg/dL      Assessment & Plan:   Problem List Items Addressed This Visit      Cardiovascular and Mediastinum   Hypertension    Well-controlled at home      Relevant Medications   tadalafil (CIALIS) 20 MG tablet   benazepril-hydrochlorthiazide (LOTENSIN HCT) 20-12.5 MG tablet     Endocrine   Impaired fasting glucose    Check A1c and glucose today; avoid "whites" (bread, sugar, flour, rice, etc)      Relevant Orders   Hemoglobin A1c     Genitourinary   Hematuria, gross    Referred to urologist in March; recheck urine today      Relevant Orders   Urinalysis w microscopic + reflex cultur   ED (erectile dysfunction)    cialis working fair; offered referral and open invitation to urologist if he desires        Other   Umbilical hernia without obstruction or gangrene    Warned about risk of incarceration; refer to surgeon      Relevant Orders   Ambulatory referral to General Surgery   Macrocytosis without anemia    Check folate, B12, likely from EtOH      Relevant Orders   Folate   Vitamin B12   CBC with Differential/Platelet   Leg cramps    Check Mg2+       Hyperlipidemia    Limit saturated fats; check fasting lipids today      Relevant Medications   tadalafil (CIALIS) 20 MG tablet   benazepril-hydrochlorthiazide (LOTENSIN HCT) 20-12.5 MG tablet   Other Relevant Orders   Lipid panel   Alcohol use    Recommend cutting back to 14 drinks a week; recommended [email or web address]      Relevant Orders   COMPLETE METABOLIC PANEL WITH GFR      Follow up plan: No Follow-up on file.  An after-visit summary was printed and given to the patient at [facility].  Please see the patient instructions which may contain other information and recommendations beyond what is mentioned above in the assessment and plan.  Meds ordered this encounter  Medications  . meloxicam (MOBIC) 15 MG tablet    Sig: Take 15 mg by mouth daily.    Refill:  0  . methocarbamol (ROBAXIN) 500 MG tablet    Sig: Take 500 mg by mouth as needed.  Refill:  0  . tadalafil (CIALIS) 20 MG tablet    Sig: One by mouth every other day PRN; do not take along with the 5 mg (one or the other)    Dispense:  3 tablet    Refill:  11  . benazepril-hydrochlorthiazide (LOTENSIN HCT) 20-12.5 MG tablet    Sig: Take 1 tablet by mouth daily.    Dispense:  30 tablet    Refill:  6    Orders Placed This Encounter  Procedures  . Folate  . Vitamin B12  . Lipid panel  . CBC with Differential/Platelet  . Urinalysis w microscopic + reflex cultur  . COMPLETE METABOLIC PANEL WITH GFR  . Hemoglobin A1c  . Ambulatory referral to General Surgery

## 2016-04-26 NOTE — Assessment & Plan Note (Signed)
Check folate, B12, likely from EtOH

## 2016-04-26 NOTE — Assessment & Plan Note (Signed)
Recommend cutting back to 14 drinks a week; recommended [email or web address]

## 2016-04-26 NOTE — Assessment & Plan Note (Signed)
Well controlled at home.

## 2016-04-26 NOTE — Assessment & Plan Note (Signed)
Limit saturated fats; check fasting lipids today 

## 2016-04-26 NOTE — Addendum Note (Signed)
Addended by: Docia Furl on: 04/26/2016 10:36 AM   Modules accepted: Orders

## 2016-04-26 NOTE — Assessment & Plan Note (Signed)
Warned about risk of incarceration; refer to surgeon

## 2016-04-26 NOTE — Assessment & Plan Note (Addendum)
cialis working fair; offered referral and open invitation to urologist if he desires

## 2016-04-26 NOTE — Assessment & Plan Note (Signed)
Check A1c and glucose today; avoid "whites" (bread, sugar, flour, rice, etc)

## 2016-04-27 LAB — URINALYSIS W MICROSCOPIC + REFLEX CULTURE
BACTERIA UA: NONE SEEN [HPF]
BILIRUBIN URINE: NEGATIVE
CASTS: NONE SEEN [LPF]
CRYSTALS: NONE SEEN [HPF]
Glucose, UA: NEGATIVE
HGB URINE DIPSTICK: NEGATIVE
KETONES UR: NEGATIVE
Leukocytes, UA: NEGATIVE
Nitrite: NEGATIVE
PROTEIN: NEGATIVE
RBC / HPF: NONE SEEN RBC/HPF (ref <=2)
Specific Gravity, Urine: 1.016 (ref 1.001–1.035)
Squamous Epithelial / LPF: NONE SEEN [HPF] (ref <=5)
WBC UA: NONE SEEN WBC/HPF (ref <=5)
Yeast: NONE SEEN [HPF]
pH: 7.5 (ref 5.0–8.0)

## 2016-04-29 ENCOUNTER — Other Ambulatory Visit: Payer: Self-pay

## 2016-04-29 DIAGNOSIS — R718 Other abnormality of red blood cells: Secondary | ICD-10-CM

## 2016-04-29 DIAGNOSIS — D582 Other hemoglobinopathies: Secondary | ICD-10-CM

## 2016-05-07 ENCOUNTER — Ambulatory Visit: Payer: 59 | Admitting: Family Medicine

## 2016-05-10 DIAGNOSIS — R0902 Hypoxemia: Secondary | ICD-10-CM | POA: Diagnosis not present

## 2016-05-12 DIAGNOSIS — R0902 Hypoxemia: Secondary | ICD-10-CM | POA: Diagnosis not present

## 2016-05-17 ENCOUNTER — Ambulatory Visit: Payer: Self-pay | Admitting: General Surgery

## 2016-05-20 ENCOUNTER — Telehealth: Payer: Self-pay | Admitting: Family Medicine

## 2016-05-20 DIAGNOSIS — R7989 Other specified abnormal findings of blood chemistry: Secondary | ICD-10-CM

## 2016-05-20 DIAGNOSIS — R0902 Hypoxemia: Secondary | ICD-10-CM | POA: Insufficient documentation

## 2016-05-20 NOTE — Assessment & Plan Note (Signed)
Elevated H/H

## 2016-05-20 NOTE — Telephone Encounter (Signed)
Patient notified

## 2016-05-20 NOTE — Telephone Encounter (Signed)
Please let the patient know that his overnight oxygen test did in fact show that his oxygen is dropping down into the 80's at night while he sleeps, so he may have sleep apnea; we'll refer him to a lung doctor for this; in the meantime, avoid sleeping pills including things like benadryl, tylenol PM, avoid alcohol late in the evening, anything that might make him groggy

## 2016-05-20 NOTE — Assessment & Plan Note (Signed)
Refer to pulm; see overnight oxygen study

## 2016-05-22 ENCOUNTER — Encounter: Payer: Self-pay | Admitting: Family Medicine

## 2016-06-17 ENCOUNTER — Telehealth: Payer: Self-pay

## 2016-06-17 ENCOUNTER — Encounter: Payer: Self-pay | Admitting: *Deleted

## 2016-06-17 NOTE — Telephone Encounter (Signed)
Per the request of Dr. Enid Derry,  I tried to contact this patient regarding his missed appointment with [facility], but he was not in. When I told his wife who I was and why I was calling she informed me that he had cancelled that appointment b/c he did not want to go there. I then asked if there was some where else that he wanted to go and she said that he has not decided yet. I told her that was fine and that I would let Dr. Sanda Klein know but to have him give [place] a call once he decides where he wants to go if he chooses to proceed with this referral. She said ok and thanks.

## 2016-07-13 ENCOUNTER — Encounter: Payer: Self-pay | Admitting: Family Medicine

## 2016-07-13 DIAGNOSIS — D582 Other hemoglobinopathies: Secondary | ICD-10-CM | POA: Insufficient documentation

## 2016-07-18 ENCOUNTER — Other Ambulatory Visit: Payer: Self-pay | Admitting: Family Medicine

## 2016-07-20 NOTE — Telephone Encounter (Signed)
Please remind patient that we wanted to recheck his CBC back in early December; please ask him to have lab done soon

## 2016-07-20 NOTE — Telephone Encounter (Signed)
Pt.notified

## 2016-08-19 ENCOUNTER — Telehealth: Payer: Self-pay | Admitting: Family Medicine

## 2016-08-19 ENCOUNTER — Other Ambulatory Visit: Payer: Self-pay

## 2016-08-19 DIAGNOSIS — R718 Other abnormality of red blood cells: Secondary | ICD-10-CM

## 2016-08-19 DIAGNOSIS — D582 Other hemoglobinopathies: Secondary | ICD-10-CM

## 2016-08-19 MED ORDER — MELOXICAM 15 MG PO TABS: 15.0000 mg | ORAL_TABLET | Freq: Every day | ORAL | 3 refills | Status: DC | PRN

## 2016-08-19 NOTE — Telephone Encounter (Signed)
At pt last office visit you told him to return in 6 months. He is asking for a refill on Meloxicam. He does have his 41m appt already scheduled. Please send script to [facility]. He has 4 pills left [phone]

## 2016-08-19 NOTE — Telephone Encounter (Signed)
Please give patient a gentle reminder that we had hoped to recheck his CBC in December; ask if he'll please get that done within the next week I'll refill the meloxicam, but it will need to be faxed

## 2016-09-10 ENCOUNTER — Other Ambulatory Visit: Payer: Self-pay | Admitting: Family Medicine

## 2016-09-10 DIAGNOSIS — R718 Other abnormality of red blood cells: Secondary | ICD-10-CM | POA: Diagnosis not present

## 2016-09-10 DIAGNOSIS — D582 Other hemoglobinopathies: Secondary | ICD-10-CM | POA: Diagnosis not present

## 2016-09-10 LAB — CBC WITH DIFFERENTIAL/PLATELET
BASOS ABS: 65 {cells}/uL (ref 0–200)
Basophils Relative: 1 %
EOS PCT: 3 %
Eosinophils Absolute: 195 cells/uL (ref 15–500)
HEMATOCRIT: 51.2 % — AB (ref 38.5–50.0)
HEMOGLOBIN: 17.2 g/dL — AB (ref 13.2–17.1)
LYMPHS ABS: 1820 {cells}/uL (ref 850–3900)
LYMPHS PCT: 28 %
MCH: 34.1 pg — AB (ref 27.0–33.0)
MCHC: 33.6 g/dL (ref 32.0–36.0)
MCV: 101.4 fL — ABNORMAL HIGH (ref 80.0–100.0)
MPV: 9.3 fL (ref 7.5–12.5)
Monocytes Absolute: 520 cells/uL (ref 200–950)
Monocytes Relative: 8 %
NEUTROS PCT: 60 %
Neutro Abs: 3900 cells/uL (ref 1500–7800)
Platelets: 245 10*3/uL (ref 140–400)
RBC: 5.05 MIL/uL (ref 4.20–5.80)
RDW: 13 % (ref 11.0–15.0)
WBC: 6.5 10*3/uL (ref 3.8–10.8)

## 2016-09-29 NOTE — Telephone Encounter (Signed)
Please close chart. It will not let me close on my end. Thank you

## 2016-10-29 ENCOUNTER — Ambulatory Visit: Payer: Medicare Other | Admitting: Family Medicine

## 2016-12-09 ENCOUNTER — Other Ambulatory Visit: Payer: Self-pay | Admitting: Family Medicine

## 2017-02-23 ENCOUNTER — Other Ambulatory Visit: Payer: Self-pay | Admitting: Family Medicine

## 2017-03-25 ENCOUNTER — Ambulatory Visit (INDEPENDENT_AMBULATORY_CARE_PROVIDER_SITE_OTHER): Payer: BLUE CROSS/BLUE SHIELD | Admitting: Family Medicine

## 2017-03-25 ENCOUNTER — Encounter: Payer: Self-pay | Admitting: Family Medicine

## 2017-03-25 VITALS — BP 118/62 | HR 100 | Temp 98.5°F | Resp 14 | Wt 210.6 lb

## 2017-03-25 DIAGNOSIS — E782 Mixed hyperlipidemia: Secondary | ICD-10-CM

## 2017-03-25 DIAGNOSIS — Z5181 Encounter for therapeutic drug level monitoring: Secondary | ICD-10-CM

## 2017-03-25 DIAGNOSIS — F1090 Alcohol use, unspecified, uncomplicated: Secondary | ICD-10-CM

## 2017-03-25 DIAGNOSIS — R310 Gross hematuria: Secondary | ICD-10-CM

## 2017-03-25 DIAGNOSIS — R0902 Hypoxemia: Secondary | ICD-10-CM

## 2017-03-25 DIAGNOSIS — Z23 Encounter for immunization: Secondary | ICD-10-CM | POA: Diagnosis not present

## 2017-03-25 DIAGNOSIS — D7589 Other specified diseases of blood and blood-forming organs: Secondary | ICD-10-CM | POA: Diagnosis not present

## 2017-03-25 DIAGNOSIS — Z789 Other specified health status: Secondary | ICD-10-CM

## 2017-03-25 DIAGNOSIS — I10 Essential (primary) hypertension: Secondary | ICD-10-CM

## 2017-03-25 DIAGNOSIS — M10042 Idiopathic gout, left hand: Secondary | ICD-10-CM | POA: Diagnosis not present

## 2017-03-25 DIAGNOSIS — R7989 Other specified abnormal findings of blood chemistry: Secondary | ICD-10-CM

## 2017-03-25 DIAGNOSIS — D582 Other hemoglobinopathies: Secondary | ICD-10-CM

## 2017-03-25 DIAGNOSIS — R7301 Impaired fasting glucose: Secondary | ICD-10-CM

## 2017-03-25 DIAGNOSIS — M109 Gout, unspecified: Secondary | ICD-10-CM | POA: Insufficient documentation

## 2017-03-25 DIAGNOSIS — Z7289 Other problems related to lifestyle: Secondary | ICD-10-CM

## 2017-03-25 MED ORDER — MELOXICAM 15 MG PO TABS
15.0000 mg | ORAL_TABLET | Freq: Every day | ORAL | 1 refills | Status: DC | PRN
Start: 2017-03-25 — End: 2017-10-18

## 2017-03-25 MED ORDER — BENAZEPRIL HCL 20 MG PO TABS: 20.0000 mg | ORAL_TABLET | Freq: Every day | ORAL | 3 refills | Status: DC

## 2017-03-25 MED ORDER — COLCHICINE 0.6 MG PO TABS: ORAL_TABLET | ORAL | 3 refills | Status: DC

## 2017-03-25 NOTE — Assessment & Plan Note (Signed)
Explained could be sign of sleep apnea, could be dangerous, recommended pulmonologist for formal sleep study; he declined at this time

## 2017-03-25 NOTE — Assessment & Plan Note (Signed)
Saw urologist per patient, resolved

## 2017-03-25 NOTE — Assessment & Plan Note (Signed)
Check UA, colchicine if needed

## 2017-03-25 NOTE — Assessment & Plan Note (Addendum)
Discussed with patient that safe limit for men is 14 drinks per week or less; he does not sound motivated to change

## 2017-03-25 NOTE — Patient Instructions (Addendum)
Gout Gout is painful swelling that can happen in some of your joints. Gout is a type of arthritis. This condition is caused by having too much uric acid in your body. Uric acid is a chemical that is made when your body breaks down substances called purines. If your body has too much uric acid, sharp crystals can form and build up in your joints. This causes pain and swelling. Gout attacks can happen quickly and be very painful (acute gout). Over time, the attacks can affect more joints and happen more often (chronic gout). Follow these instructions at home: During a Gout Attack  If directed, put ice on the painful area: ? Put ice in a plastic bag. ? Place a towel between your skin and the bag. ? Leave the ice on for 20 minutes, 2-3 times a day.  Rest the joint as much as possible. If the joint is in your leg, you may be given crutches to use.  Raise (elevate) the painful joint above the level of your heart as often as you can.  Drink enough fluids to keep your pee (urine) clear or pale yellow.  Take over-the-counter and prescription medicines only as told by your doctor.  Do not drive or use heavy machinery while taking prescription pain medicine.  Follow instructions from your doctor about what you can or cannot eat and drink.  Return to your normal activities as told by your doctor. Ask your doctor what activities are safe for you. Avoiding Future Gout Attacks  Follow a low-purine diet as told by a specialist (dietitian) or your doctor. Avoid foods and drinks that have a lot of purines, such as: ? Liver. ? Kidney. ? Anchovies. ? Asparagus. ? Herring. ? Mushrooms ? Mussels. ? Beer.  Limit alcohol intake to no more than 1 drink a day for nonpregnant women and 2 drinks a day for men. One drink equals 12 oz of beer, 5 oz of wine, or 1 oz of hard liquor.  Stay at a healthy weight or lose weight if you are overweight. If you want to lose weight, talk with your doctor. It is  important that you do not lose weight too fast.  Start or continue an exercise plan as told by your doctor.  Drink enough fluids to keep your pee clear or pale yellow.  Take over-the-counter and prescription medicines only as told by your doctor.  Keep all follow-up visits as told by your doctor. This is important. Contact a doctor if:  You have another gout attack.  You still have symptoms of a gout attack after10 days of treatment.  You have problems (side effects) because of your medicines.  You have chills or a fever.  You have burning pain when you pee (urinate).  You have pain in your lower back or belly. Get help right away if:  You have very bad pain.  Your pain cannot be controlled.  You cannot pee. This information is not intended to replace advice given to you by your health care provider. Make sure you discuss any questions you have with your health care provider. Document Released: 03/16/2008 Document Revised: 11/13/2015 Document Reviewed: 03/20/2015 Elsevier Interactive Patient Education  2018 [employer].  Alcohol Use Disorder Alcohol use disorder is when your drinking disrupts your daily life. When you have this condition, you drink too much alcohol and you cannot control your drinking. Alcohol use disorder can cause serious problems with your physical health. It can affect your brain, heart, liver, pancreas,  immune system, stomach, and intestines. Alcohol use disorder can increase your risk for certain cancers and cause problems with your mental health, such as depression, anxiety, psychosis, delirium, and dementia. People with this disorder risk hurting themselves and others. What are the causes? This condition is caused by drinking too much alcohol over time. It is not caused by drinking too much alcohol only one or two times. Some people with this condition drink alcohol to cope with or escape from negative life events. Others drink to relieve pain or  symptoms of mental illness. What increases the risk? You are more likely to develop this condition if:  You have a family history of alcohol use disorder.  Your culture encourages drinking to the point of intoxication, or makes alcohol easy to get.  You had a mood or conduct disorder in childhood.  You have been a victim of abuse.  You are an adolescent and: ? You have poor grades or difficulties in school. ? Your caregivers do not talk to you about saying no to alcohol, or supervise your activities. ? You are impulsive or you have trouble with self-control.  What are the signs or symptoms? Symptoms of this condition include:  Drinkingmore than you want to.  Drinking for longer than you want to.  Trying several times to drink less or to control your drinking.  Spending a lot of time getting alcohol, drinking, or recovering from drinking.  Craving alcohol.  Having problems at work, at school, or at home due to drinking.  Having problems in relationships due to drinking.  Drinking when it is dangerous to drink, such as before driving a car.  Continuing to drink even though you know you might have a physical or mental problem related to drinking.  Needing more and more alcohol to get the same effect you want from the alcohol (building up tolerance).  Having symptoms of withdrawal when you stop drinking. Symptoms of withdrawal include: ? Fatigue. ? Nightmares. ? Trouble sleeping. ? Depression. ? Anxiety. ? Fever. ? Seizures. ? Severe confusion. ? Feeling or seeing things that are not there (hallucinations). ? Tremors. ? Rapid heart rate. ? Rapid breathing. ? High blood pressure.  Drinking to avoid symptoms of withdrawal.  How is this diagnosed? This condition is diagnosed with an assessment. Your health care provider may start the assessment by asking three or four questions about your drinking. Your health care provider may perform a physical exam or do lab  tests to see if you have physical problems resulting from alcohol use. She or he may refer you to a mental health professional for evaluation. How is this treated? Some people with alcohol use disorder are able to reduce their alcohol use to low-risk levels. Others need to completely quit drinking alcohol. When necessary, mental health professionals with specialized training in substance use treatment can help. Your health care provider can help you decide how severe your alcohol use disorder is and what type of treatment you need. The following forms of treatment are available:  Detoxification. Detoxification involves quitting drinking and using prescription medicines within the first week to help lessen withdrawal symptoms. This treatment is important for people who have had withdrawal symptoms before and for heavy drinkers who are likely to have withdrawal symptoms. Alcohol withdrawal can be dangerous, and in severe cases, it can cause death. Detoxification may be provided in a home, community, or primary care setting, or in a hospital or substance use treatment facility.  Counseling. This treatment is  also called talk therapy. It is provided by substance use treatment counselors. A counselor can address the reasons you use alcohol and suggest ways to keep you from drinking again or to prevent problem drinking. The goals of talk therapy are to: ? Find healthy activities and ways for you to cope with stress. ? Identify and avoid the things that trigger your alcohol use. ? Help you learn how to handle cravings.  Medicines.Medicines can help treat alcohol use disorder by: ? Decreasing alcohol cravings. ? Decreasing the positive feeling you have when you drink alcohol. ? Causing an uncomfortable physical reaction when you drink alcohol (aversion therapy).  Support groups. Support groups are led by people who have quit drinking. They provide emotional support, advice, and guidance.  These forms of  treatment are often combined. Some people with this condition benefit from a combination of treatments provided by specialized substance use treatment centers. Follow these instructions at home:  Take over-the-counter and prescription medicines only as told by your health care provider.  Check with your health care provider before starting any new medicines.  Ask friends and family members not to offer you alcohol.  Avoid situations where alcohol is served, including gatherings where others are drinking alcohol.  Create a plan for what to do when you are tempted to use alcohol.  Find hobbies or activities that you enjoy that do not include alcohol.  Keep all follow-up visits as told by your health care provider. This is important. How is this prevented?  If you drink, limit alcohol intake to no more than 1 drink a day for nonpregnant women and 2 drinks a day for men. One drink equals 12 oz of beer, 5 oz of wine, or 1 oz of hard liquor.  If you have a mental health condition, get treatment and support.  Do not give alcohol to adolescents.  If you are an adolescent: ? Do not drink alcohol. ? Do not be afraid to say no if someone offers you alcohol. Speak up about why you do not want to drink. You can be a positive role model for your friends and set a good example for those around you by not drinking alcohol. ? If your friends drink, spend time with others who do not drink alcohol. Make new friends who do not use alcohol. ? Find healthy ways to manage stress and emotions, such as meditation or deep breathing, exercise, spending time in nature, listening to music, or talking with a trusted friend or family member. Contact a health care provider if:  You are not able to take your medicines as told.  Your symptoms get worse.  You return to drinking alcohol (relapse) and your symptoms get worse. Get help right away if:  You have thoughts about hurting yourself or others. If you ever  feel like you may hurt yourself or others, or have thoughts about taking your own life, get help right away. You can go to your nearest emergency department or call:  Your local emergency services (911 in the [place]  A suicide crisis helpline, such as the [facility] at 731-728-6834. This is open 24 hours a day.  Summary  Alcohol use disorder is when your drinking disrupts your daily life. When you have this condition, you drink too much alcohol and you cannot control your drinking.  Treatment may include detoxification, counseling, medicine, and support groups.  Ask friends and family members not to offer you alcohol. Avoid situations where alcohol is served.  Get help right away if you have thoughts about hurting yourself or others. This information is not intended to replace advice given to you by your health care provider. Make sure you discuss any questions you have with your health care provider. Document Released: 07/15/2004 Document Revised: 03/04/2016 Document Reviewed: 03/04/2016 Elsevier Interactive Patient Education  [employer].

## 2017-03-25 NOTE — Progress Notes (Signed)
BP 118/62   Pulse 100   Temp 98.5 F (36.9 C) (Oral)   Resp 14   Wt 210 lb 9.6 oz (95.5 kg)   SpO2 95%   BMI 29.79 kg/m    Subjective:    Patient ID: James Medina, male    DOB: Jun 27, 1945, 71 y.o.   MRN: [id number]  HPI: James Medina is a 71 y.o. male  Chief Complaint  Patient presents with  . Medication Refill  . Hand Pain    left ring figer possible gout    HPI Patient is here for f/u He had an episode of gout in his 4th finger, left hand Hardly ever eats red meat; no shrimp; some gravy 2 weeks ago Drinking about 4-5 drinks a day; I recommended he cut back, offered help  He also has HTN; taking medicine; well-controlled  He has arthritis; taking meloxicam daily; fingers, right wrist, both knees; he went 3-4 days and didn't take it; knee started to really bother him  Elevated H/H; he did the testing and he did not leave it on long enough; he had all those wires hanging on him  Prediabetes  Depression screen [facility] 2/9 03/25/2017 04/26/2016  Decreased Interest 0 0  Down, Depressed, Hopeless 0 0  PHQ - 2 Score 0 0    Relevant past medical, surgical, family and social history reviewed Past Medical History:  Diagnosis Date  . ED (erectile dysfunction)   . Hyperlipidemia   . Hypertension   . Impotence   . OA (osteoarthritis)    hand   Past Surgical History:  Procedure Laterality Date  . APPENDECTOMY    . broken ankle     Family History  Problem Relation Age of Onset  . Cancer Mother        leukemia  . Heart disease Father   . Learning disabilities Maternal Uncle   . Diabetes Brother   . Stroke Brother   . Diabetes Brother   . Cancer Maternal Grandmother   . Emphysema Maternal Grandfather   . COPD Neg Hx   . Hypertension Neg Hx    Social History   Social History  . Marital status: Married    Spouse name: N/A  . Number of children: N/A  . Years of education: N/A   Occupational History  . Not on file.   Social History Main Topics  .  Smoking status: Former Smoker    Types: Cigars    Quit date: 04/06/2012  . Smokeless tobacco: Current User    Types: Chew  . Alcohol use 8.4 oz/week    14 Cans of beer per week     Comment: 2 beers a night  . Drug use: No  . Sexual activity: Yes   Other Topics Concern  . Not on file   Social History Narrative  . No narrative on file    Interim medical history since last visit reviewed. Allergies and medications reviewed  Review of Systems Per HPI unless specifically indicated above     Objective:    BP 118/62   Pulse 100   Temp 98.5 F (36.9 C) (Oral)   Resp 14   Wt 210 lb 9.6 oz (95.5 kg)   SpO2 95%   BMI 29.79 kg/m   Wt Readings from Last 3 Encounters:  03/25/17 210 lb 9.6 oz (95.5 kg)  04/26/16 219 lb (99.3 kg)  09/05/15 206 lb (93.4 kg)    Physical Exam  Constitutional: He appears well-developed and  well-nourished. No distress.  HENT:  Head: Normocephalic and atraumatic.  Eyes: EOM are normal. No scleral icterus.  Neck: No thyromegaly present.  Cardiovascular: Normal rate and regular rhythm.   Pulmonary/Chest: Effort normal and breath sounds normal.  Abdominal: Soft. Normal appearance and bowel sounds are normal. He exhibits no distension. A hernia (umbilical) is present.  Musculoskeletal: He exhibits no edema.       Left hand: He exhibits tenderness and deformity.  Enlargement 4th finger pip  Neurological: Coordination normal.  Skin: Skin is warm and dry. No pallor.  Ruddy, plethoric complexion  Psychiatric: He has a normal mood and affect. His behavior is normal. Judgment and thought content normal.   Results for orders placed or performed in visit on 09/10/16  CBC with Differential/Platelet  Result Value Ref Range   WBC 6.5 3.8 - 10.8 K/uL   RBC 5.05 4.20 - 5.80 MIL/uL   Hemoglobin 17.2 (H) 13.2 - 17.1 g/dL   HCT 51.2 (H) 38.5 - 50.0 %   MCV 101.4 (H) 80.0 - 100.0 fL   MCH 34.1 (H) 27.0 - 33.0 pg   MCHC 33.6 32.0 - 36.0 g/dL   RDW 13.0 11.0 -  15.0 %   Platelets 245 140 - 400 K/uL   MPV 9.3 7.5 - 12.5 fL   Neutro Abs 3,900 1,500 - 7,800 cells/uL   Lymphs Abs 1,820 850 - 3,900 cells/uL   Monocytes Absolute 520 200 - 950 cells/uL   Eosinophils Absolute 195 15 - 500 cells/uL   Basophils Absolute 65 0 - 200 cells/uL   Neutrophils Relative % 60 %   Lymphocytes Relative 28 %   Monocytes Relative 8 %   Eosinophils Relative 3 %   Basophils Relative 1 %   Smear Review Criteria for review not met       Assessment & Plan:   Problem List Items Addressed This Visit      Cardiovascular and Mediastinum   Hypertension    Will stop the hctz component with suspected gout flare      Relevant Medications   aspirin EC 81 MG tablet   benazepril (LOTENSIN) 20 MG tablet     Endocrine   Impaired fasting glucose   Relevant Orders   Hemoglobin A1c     Genitourinary   Hematuria, gross    Saw urologist per patient, resolved        Other   Medication monitoring encounter   Relevant Orders   COMPLETE METABOLIC PANEL WITH GFR   Macrocytosis without anemia   Relevant Orders   CBC with Differential/Platelet   Hyperlipidemia   Relevant Medications   aspirin EC 81 MG tablet   benazepril (LOTENSIN) 20 MG tablet   Other Relevant Orders   Lipid panel   Abnormal CBC   Relevant Orders   CBC with Differential/Platelet   Hypoxemia    Patient declines any additional referral or testing; discussed risk of hypoxia; still not interested      Gout - Primary    Check UA, colchicine if needed      Relevant Orders   Uric acid   Elevated hemoglobin (HCC)    Explained could be sign of sleep apnea, could be dangerous, recommended pulmonologist for formal sleep study; he declined at this time      Alcohol use    Discussed with patient that safe limit for men is 14 drinks per week or less; he does not sound motivated to change       Other  Visit Diagnoses    Needs flu shot       Relevant Orders   Flu vaccine HIGH DOSE PF (Fluzone High  dose) (Completed)       Follow up plan: Return in about 6 months (around 09/23/2017) for twenty minute follow-up with fasting labs.  An after-visit summary was printed and given to the patient at [place].  Please see the patient instructions which may contain other information and recommendations beyond what is mentioned above in the assessment and plan.  Meds ordered this encounter  Medications  . colchicine 0.6 MG tablet    Sig: Take two pills at the onset of a gout attack; take one more pill one hour later    Dispense:  3 tablet    Refill:  3  . meloxicam (MOBIC) 15 MG tablet    Sig: Take 1 tablet (15 mg total) by mouth daily as needed.    Dispense:  90 tablet    Refill:  1  . aspirin EC 81 MG tablet    Sig: Take 1 tablet (81 mg total) by mouth daily. Take at least 1 hour before meloxicam  . benazepril (LOTENSIN) 20 MG tablet    Sig: Take 1 tablet (20 mg total) by mouth daily.    Dispense:  90 tablet    Refill:  3    We're discontinuing the HCTZ component because of gout flare    Orders Placed This Encounter  Procedures  . Flu vaccine HIGH DOSE PF (Fluzone High dose)  . CBC with Differential/Platelet  . COMPLETE METABOLIC PANEL WITH GFR  . Hemoglobin A1c  . Lipid panel  . Uric acid

## 2017-03-25 NOTE — Assessment & Plan Note (Signed)
Will stop the hctz component with suspected gout flare

## 2017-03-25 NOTE — Assessment & Plan Note (Signed)
Patient declines any additional referral or testing; discussed risk of hypoxia; still not interested

## 2017-03-26 LAB — COMPLETE METABOLIC PANEL WITH GFR
AG Ratio: 1.3 (calc) (ref 1.0–2.5)
ALKALINE PHOSPHATASE (APISO): 87 U/L (ref 40–115)
ALT: 35 U/L (ref 9–46)
AST: 56 U/L — AB (ref 10–35)
Albumin: 4.1 g/dL (ref 3.6–5.1)
BUN: 12 mg/dL (ref 7–25)
CO2: 24 mmol/L (ref 20–32)
CREATININE: 0.89 mg/dL (ref 0.70–1.25)
Calcium: 9.3 mg/dL (ref 8.6–10.3)
Chloride: 101 mmol/L (ref 98–110)
GFR, Est African American: 101 mL/min/{1.73_m2} (ref >=60)
GFR, Est Non African American: 87 mL/min/{1.73_m2} (ref >=60)
GLOBULIN: 3.1 g/dL (ref 1.9–3.7)
Glucose, Bld: 103 mg/dL — ABNORMAL HIGH (ref 65–99)
POTASSIUM: 4.2 mmol/L (ref 3.5–5.3)
SODIUM: 137 mmol/L (ref 135–146)
Total Bilirubin: 1.1 mg/dL (ref 0.2–1.2)
Total Protein: 7.2 g/dL (ref 6.1–8.1)

## 2017-03-26 LAB — CBC WITH DIFFERENTIAL/PLATELET
BASOS ABS: 52 {cells}/uL (ref 0–200)
Basophils Relative: 0.7 %
EOS PCT: 1.8 %
Eosinophils Absolute: 133 cells/uL (ref 15–500)
HEMATOCRIT: 50.5 % — AB (ref 38.5–50.0)
Hemoglobin: 17.9 g/dL — ABNORMAL HIGH (ref 13.2–17.1)
LYMPHS ABS: 1332 {cells}/uL (ref 850–3900)
MCH: 34.4 pg — ABNORMAL HIGH (ref 27.0–33.0)
MCHC: 35.4 g/dL (ref 32.0–36.0)
MCV: 97.1 fL (ref 80.0–100.0)
MONOS PCT: 7.1 %
MPV: 8.9 fL (ref 7.5–12.5)
NEUTROS PCT: 72.4 %
Neutro Abs: 5358 cells/uL (ref 1500–7800)
PLATELETS: 241 10*3/uL (ref 140–400)
RBC: 5.2 10*6/uL (ref 4.20–5.80)
RDW: 12.1 % (ref 11.0–15.0)
TOTAL LYMPHOCYTE: 18 %
WBC mixed population: 525 cells/uL (ref 200–950)
WBC: 7.4 10*3/uL (ref 3.8–10.8)

## 2017-03-26 LAB — LIPID PANEL
CHOL/HDL RATIO: 2.5 (calc) (ref <5.0)
CHOLESTEROL: 215 mg/dL — AB (ref <200)
HDL: 87 mg/dL (ref >40)
LDL Cholesterol (Calc): 114 mg/dL (calc) — ABNORMAL HIGH
Non-HDL Cholesterol (Calc): 128 mg/dL (calc) (ref <130)
Triglycerides: 58 mg/dL (ref <150)

## 2017-03-26 LAB — HEMOGLOBIN A1C
Hgb A1c MFr Bld: 5.5 % of total Hgb (ref <5.7)
Mean Plasma Glucose: 111 (calc)
eAG (mmol/L): 6.2 (calc)

## 2017-03-26 LAB — URIC ACID: Uric Acid, Serum: 7.5 mg/dL (ref 4.0–8.0)

## 2017-03-29 ENCOUNTER — Other Ambulatory Visit: Payer: Self-pay | Admitting: Family Medicine

## 2017-03-29 DIAGNOSIS — Z5181 Encounter for therapeutic drug level monitoring: Secondary | ICD-10-CM

## 2017-03-29 DIAGNOSIS — E782 Mixed hyperlipidemia: Secondary | ICD-10-CM

## 2017-03-29 MED ORDER — ATORVASTATIN CALCIUM 10 MG PO TABS: 10.0000 mg | ORAL_TABLET | ORAL | 1 refills | Status: DC

## 2017-03-29 NOTE — Progress Notes (Signed)
Will use every other day statin since patient reported side effects from previous statin See note under lab section I ordered labs for around December 5th

## 2017-03-30 ENCOUNTER — Other Ambulatory Visit: Payer: Self-pay

## 2017-03-30 DIAGNOSIS — Z5181 Encounter for therapeutic drug level monitoring: Secondary | ICD-10-CM

## 2017-03-30 DIAGNOSIS — E782 Mixed hyperlipidemia: Secondary | ICD-10-CM

## 2017-04-05 ENCOUNTER — Encounter: Payer: Self-pay | Admitting: Family Medicine

## 2017-04-05 ENCOUNTER — Ambulatory Visit (INDEPENDENT_AMBULATORY_CARE_PROVIDER_SITE_OTHER): Payer: BLUE CROSS/BLUE SHIELD | Admitting: Family Medicine

## 2017-04-05 VITALS — BP 110/60 | HR 83 | Temp 98.4°F | Resp 14 | Wt 219.6 lb

## 2017-04-05 DIAGNOSIS — R7301 Impaired fasting glucose: Secondary | ICD-10-CM

## 2017-04-05 DIAGNOSIS — L237 Allergic contact dermatitis due to plants, except food: Secondary | ICD-10-CM

## 2017-04-05 MED ORDER — PREDNISONE 10 MG PO TABS: ORAL_TABLET | ORAL | 0 refills | Status: AC

## 2017-04-05 MED ORDER — CLOBETASOL PROPIONATE 0.05 % EX CREA: 1.0000 "application " | TOPICAL_CREAM | Freq: Two times a day (BID) | CUTANEOUS | 0 refills | Status: DC

## 2017-04-05 NOTE — Patient Instructions (Addendum)
Start the prednisone and always take with food Do not take meloxicam while on prednisone If you develop any signs of a stomach ulcer, please seek help immediately Use the prescription cream on the arms or trunk or legs, but not the face or groin  Poison Ivy Dermatitis Poison ivy dermatitis is redness and soreness (inflammation) of the skin. It is caused by a chemical that is found on the leaves of the poison ivy plant. You may also have itching, a rash, and blisters. Symptoms often clear up in 1-2 weeks. You may get this condition by touching a poison ivy plant. You can also get it by touching something that has the chemical on it. This may include animals or objects that have come in contact with the plant. Follow these instructions at home: General instructions  Take or apply over-the-counter and prescription medicines only as told by your doctor.  If you touch poison ivy, wash your skin with soap and cold water right away.  Use hydrocortisone creams or calamine lotion as needed to help with itching.  Take oatmeal baths as needed. Use colloidal oatmeal. You can get this at a pharmacy or grocery store. Follow the instructions on the package.  Do not scratch or rub your skin.  While you have the rash, wash your clothes right after you wear them. Prevention  Know what poison ivy looks like so you can avoid it. This plant has three leaves with flowering branches on a single stem. The leaves are glossy. They have uneven edges that come to a point at the front.  If you have touched poison ivy, wash with soap and water right away. Be sure to wash under your fingernails.  When hiking or camping, wear long pants, a long-sleeved shirt, tall socks, and hiking boots. You can also use a lotion on your skin that helps to prevent contact with the chemical on the plant.  If you think that your clothes or outdoor gear came in contact with poison ivy, rinse them off with a garden hose before you bring  them inside your house. Contact a doctor if:  You have open sores in the rash area.  You have more redness, swelling, or pain in the affected area.  You have redness that spreads beyond the rash area.  You have fluid, blood, or pus coming from the affected area.  You have a fever.  You have a rash over a large area of your body.  You have a rash on your eyes, mouth, or genitals.  Your rash does not get better after a few days. Get help right away if:  Your face swells or your eyes swell shut.  You have trouble breathing.  You have trouble swallowing. This information is not intended to replace advice given to you by your health care provider. Make sure you discuss any questions you have with your health care provider. Document Released: 07/10/2010 Document Revised: 11/13/2015 Document Reviewed: 11/13/2014 Elsevier Interactive Patient Education  [employer].
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
## 2017-04-05 NOTE — Assessment & Plan Note (Signed)
Will treat contact dermatitis with prednisone; do not suspect this will cause excessive problem with glucose

## 2017-04-11 ENCOUNTER — Other Ambulatory Visit: Payer: Self-pay | Admitting: Family Medicine

## 2017-04-11 MED ORDER — TRIAMCINOLONE ACETONIDE 0.5 % EX CREA: 1.0000 "application " | TOPICAL_CREAM | Freq: Three times a day (TID) | CUTANEOUS | 0 refills | Status: DC | PRN

## 2017-04-11 NOTE — Progress Notes (Signed)
Clobetasol not covered by insurance New Rx sent

## 2017-05-25 DIAGNOSIS — Z5181 Encounter for therapeutic drug level monitoring: Secondary | ICD-10-CM | POA: Diagnosis not present

## 2017-05-25 DIAGNOSIS — E782 Mixed hyperlipidemia: Secondary | ICD-10-CM | POA: Diagnosis not present

## 2017-05-25 LAB — LIPID PANEL
CHOL/HDL RATIO: 2.5 (calc) (ref <5.0)
Cholesterol: 171 mg/dL (ref <200)
HDL: 69 mg/dL (ref >40)
LDL CHOLESTEROL (CALC): 88 mg/dL
NON-HDL CHOLESTEROL (CALC): 102 mg/dL (ref <130)
Triglycerides: 56 mg/dL (ref <150)

## 2017-05-25 LAB — HEPATIC FUNCTION PANEL
AG RATIO: 1.4 (calc) (ref 1.0–2.5)
ALBUMIN MSPROF: 3.9 g/dL (ref 3.6–5.1)
ALT: 18 U/L (ref 9–46)
AST: 23 U/L (ref 10–35)
Alkaline phosphatase (APISO): 86 U/L (ref 40–115)
BILIRUBIN DIRECT: 0.2 mg/dL (ref 0.0–0.2)
BILIRUBIN TOTAL: 0.9 mg/dL (ref 0.2–1.2)
Globulin: 2.7 g/dL (calc) (ref 1.9–3.7)
Indirect Bilirubin: 0.7 mg/dL (calc) (ref 0.2–1.2)
Total Protein: 6.6 g/dL (ref 6.1–8.1)

## 2017-07-18 ENCOUNTER — Other Ambulatory Visit: Payer: Self-pay | Admitting: Family Medicine

## 2017-07-18 NOTE — Telephone Encounter (Signed)
Last labs reviewed; Rx approved

## 2017-09-23 ENCOUNTER — Ambulatory Visit: Payer: BLUE CROSS/BLUE SHIELD | Admitting: Family Medicine

## 2017-10-17 ENCOUNTER — Other Ambulatory Visit: Payer: Self-pay

## 2017-10-17 ENCOUNTER — Emergency Department
Admission: EM | Admit: 2017-10-17 | Discharge: 2017-10-17 | Disposition: A | Discharge status: Home / Self Care | Payer: BLUE CROSS/BLUE SHIELD | Attending: Emergency Medicine | Admitting: Emergency Medicine

## 2017-10-17 ENCOUNTER — Emergency Department: Payer: BLUE CROSS/BLUE SHIELD

## 2017-10-17 DIAGNOSIS — Y92410 Unspecified street and highway as the place of occurrence of the external cause: Secondary | ICD-10-CM | POA: Insufficient documentation

## 2017-10-17 DIAGNOSIS — Y9259 Other trade areas as the place of occurrence of the external cause: Secondary | ICD-10-CM | POA: Diagnosis not present

## 2017-10-17 DIAGNOSIS — Y999 Unspecified external cause status: Secondary | ICD-10-CM | POA: Diagnosis not present

## 2017-10-17 DIAGNOSIS — S299XXA Unspecified injury of thorax, initial encounter: Secondary | ICD-10-CM | POA: Diagnosis not present

## 2017-10-17 DIAGNOSIS — I7121 Aneurysm of the ascending aorta, without rupture: Secondary | ICD-10-CM

## 2017-10-17 DIAGNOSIS — Y990 Civilian activity done for income or pay: Secondary | ICD-10-CM | POA: Insufficient documentation

## 2017-10-17 DIAGNOSIS — I10 Essential (primary) hypertension: Secondary | ICD-10-CM | POA: Diagnosis not present

## 2017-10-17 DIAGNOSIS — W228XXA Striking against or struck by other objects, initial encounter: Secondary | ICD-10-CM | POA: Diagnosis not present

## 2017-10-17 DIAGNOSIS — Z7982 Long term (current) use of aspirin: Secondary | ICD-10-CM | POA: Diagnosis not present

## 2017-10-17 DIAGNOSIS — S199XXA Unspecified injury of neck, initial encounter: Secondary | ICD-10-CM | POA: Diagnosis not present

## 2017-10-17 DIAGNOSIS — I712 Thoracic aortic aneurysm, without rupture: Secondary | ICD-10-CM | POA: Diagnosis not present

## 2017-10-17 DIAGNOSIS — Y939 Activity, unspecified: Secondary | ICD-10-CM | POA: Insufficient documentation

## 2017-10-17 DIAGNOSIS — S0990XA Unspecified injury of head, initial encounter: Secondary | ICD-10-CM | POA: Diagnosis not present

## 2017-10-17 DIAGNOSIS — S01512A Laceration without foreign body of oral cavity, initial encounter: Secondary | ICD-10-CM

## 2017-10-17 DIAGNOSIS — R55 Syncope and collapse: Secondary | ICD-10-CM

## 2017-10-17 DIAGNOSIS — Z87891 Personal history of nicotine dependence: Secondary | ICD-10-CM | POA: Diagnosis not present

## 2017-10-17 DIAGNOSIS — Z23 Encounter for immunization: Secondary | ICD-10-CM | POA: Diagnosis not present

## 2017-10-17 DIAGNOSIS — R1010 Upper abdominal pain, unspecified: Secondary | ICD-10-CM | POA: Diagnosis not present

## 2017-10-17 DIAGNOSIS — S3991XA Unspecified injury of abdomen, initial encounter: Secondary | ICD-10-CM | POA: Diagnosis not present

## 2017-10-17 LAB — CBC
HCT: 48.6 % (ref 40.0–52.0)
Hemoglobin: 16.6 g/dL (ref 13.0–18.0)
MCH: 34.7 pg — AB (ref 26.0–34.0)
MCHC: 34.1 g/dL (ref 32.0–36.0)
MCV: 101.9 fL — ABNORMAL HIGH (ref 80.0–100.0)
Platelets: 193 10*3/uL (ref 150–440)
RBC: 4.77 MIL/uL (ref 4.40–5.90)
RDW: 13.7 % (ref 11.5–14.5)
WBC: 5.3 10*3/uL (ref 3.8–10.6)

## 2017-10-17 LAB — URINALYSIS, COMPLETE (UACMP) WITH MICROSCOPIC
Bacteria, UA: NONE SEEN
Bilirubin Urine: NEGATIVE
GLUCOSE, UA: NEGATIVE mg/dL
Ketones, ur: NEGATIVE mg/dL
Leukocytes, UA: NEGATIVE
NITRITE: NEGATIVE
PROTEIN: NEGATIVE mg/dL
SPECIFIC GRAVITY, URINE: 1.018 (ref 1.005–1.030)
Squamous Epithelial / LPF: NONE SEEN (ref 0–5)
pH: 5 (ref 5.0–8.0)

## 2017-10-17 LAB — URINE DRUG SCREEN, QUALITATIVE (ARMC ONLY)
AMPHETAMINES, UR SCREEN: NOT DETECTED
Barbiturates, Ur Screen: NOT DETECTED
Benzodiazepine, Ur Scrn: NOT DETECTED
COCAINE METABOLITE, UR ~~LOC~~: NOT DETECTED
Cannabinoid 50 Ng, Ur ~~LOC~~: POSITIVE — AB
MDMA (ECSTASY) UR SCREEN: NOT DETECTED
Methadone Scn, Ur: NOT DETECTED
Opiate, Ur Screen: NOT DETECTED
PHENCYCLIDINE (PCP) UR S: NOT DETECTED
TRICYCLIC, UR SCREEN: NOT DETECTED

## 2017-10-17 LAB — BASIC METABOLIC PANEL
ANION GAP: 11 (ref 5–15)
BUN: 9 mg/dL (ref 6–20)
CALCIUM: 8.1 mg/dL — AB (ref 8.9–10.3)
CO2: 24 mmol/L (ref 22–32)
CREATININE: 0.99 mg/dL (ref 0.61–1.24)
Chloride: 96 mmol/L — ABNORMAL LOW (ref 101–111)
GFR calc Af Amer: >60 mL/min (ref >60)
GLUCOSE: 106 mg/dL — AB (ref 65–99)
Potassium: 3.8 mmol/L (ref 3.5–5.1)
Sodium: 131 mmol/L — ABNORMAL LOW (ref 135–145)

## 2017-10-17 LAB — HEPATIC FUNCTION PANEL
ALBUMIN: 3.5 g/dL (ref 3.5–5.0)
ALT: 21 U/L (ref 17–63)
AST: 39 U/L (ref 15–41)
Alkaline Phosphatase: 103 U/L (ref 38–126)
BILIRUBIN INDIRECT: 0.5 mg/dL (ref 0.3–0.9)
Bilirubin, Direct: 0.1 mg/dL (ref 0.1–0.5)
Total Bilirubin: 0.6 mg/dL (ref 0.3–1.2)
Total Protein: 7 g/dL (ref 6.5–8.1)

## 2017-10-17 LAB — ACETAMINOPHEN LEVEL: Acetaminophen (Tylenol), Serum: <10 ug/mL — ABNORMAL LOW (ref 10–30)

## 2017-10-17 LAB — ETHANOL: ALCOHOL ETHYL (B): 39 mg/dL — AB (ref <10)

## 2017-10-17 LAB — TROPONIN I

## 2017-10-17 LAB — SALICYLATE LEVEL

## 2017-10-17 MED ORDER — OXYCODONE HCL 5 MG PO TABS: 5.0000 mg | ORAL_TABLET | Freq: Once | ORAL | Status: DC

## 2017-10-17 MED ORDER — ACETAMINOPHEN 500 MG PO TABS: 1000.0000 mg | ORAL_TABLET | Freq: Once | ORAL | Status: DC

## 2017-10-17 MED ORDER — IOPAMIDOL (ISOVUE-370) INJECTION 76%
75.0000 mL | Freq: Once | INTRAVENOUS | Status: AC | PRN
  Administered 2017-10-17: 75 mL via INTRAVENOUS

## 2017-10-17 MED ORDER — TETANUS-DIPHTH-ACELL PERTUSSIS 5-2.5-18.5 LF-MCG/0.5 IM SUSP
0.5000 mL | Freq: Once | INTRAMUSCULAR | Status: AC
  Administered 2017-10-17: 0.5 mL via INTRAMUSCULAR
  Filled 2017-10-17: qty 0.5

## 2017-10-17 MED ORDER — LIDOCAINE HCL (PF) 1 % IJ SOLN: 10.0000 mL | Freq: Once | INTRAMUSCULAR | Status: DC

## 2017-10-17 MED ORDER — OXYCODONE-ACETAMINOPHEN 5-325 MG PO TABS: 1.0000 | ORAL_TABLET | ORAL | 0 refills | Status: DC | PRN

## 2017-10-17 MED ORDER — LIDOCAINE HCL (PF) 1 % IJ SOLN
INTRAMUSCULAR | Status: AC
  Filled 2017-10-17: qty 5

## 2017-10-17 NOTE — ED Notes (Signed)
ED Provider at bedside for suture 

## 2017-10-17 NOTE — ED Notes (Signed)
Patient transported to CT 
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
## 2017-10-18 ENCOUNTER — Ambulatory Visit (INDEPENDENT_AMBULATORY_CARE_PROVIDER_SITE_OTHER): Payer: BLUE CROSS/BLUE SHIELD | Admitting: Family Medicine

## 2017-10-18 ENCOUNTER — Encounter: Payer: Self-pay | Admitting: Family Medicine

## 2017-10-18 VITALS — BP 110/54 | HR 90 | Temp 98.7°F | Resp 18 | Ht 72.0 in | Wt 212.5 lb

## 2017-10-18 DIAGNOSIS — I10 Essential (primary) hypertension: Secondary | ICD-10-CM | POA: Diagnosis not present

## 2017-10-18 DIAGNOSIS — E782 Mixed hyperlipidemia: Secondary | ICD-10-CM

## 2017-10-18 DIAGNOSIS — F1290 Cannabis use, unspecified, uncomplicated: Secondary | ICD-10-CM | POA: Diagnosis not present

## 2017-10-18 DIAGNOSIS — R059 Cough, unspecified: Secondary | ICD-10-CM

## 2017-10-18 DIAGNOSIS — K1379 Other lesions of oral mucosa: Secondary | ICD-10-CM

## 2017-10-18 DIAGNOSIS — R7301 Impaired fasting glucose: Secondary | ICD-10-CM | POA: Diagnosis not present

## 2017-10-18 DIAGNOSIS — F1020 Alcohol dependence, uncomplicated: Secondary | ICD-10-CM

## 2017-10-18 DIAGNOSIS — R062 Wheezing: Secondary | ICD-10-CM | POA: Diagnosis not present

## 2017-10-18 DIAGNOSIS — M19041 Primary osteoarthritis, right hand: Secondary | ICD-10-CM | POA: Insufficient documentation

## 2017-10-18 DIAGNOSIS — D582 Other hemoglobinopathies: Secondary | ICD-10-CM | POA: Diagnosis not present

## 2017-10-18 DIAGNOSIS — M19042 Primary osteoarthritis, left hand: Secondary | ICD-10-CM

## 2017-10-18 DIAGNOSIS — R05 Cough: Secondary | ICD-10-CM

## 2017-10-18 HISTORY — DX: Alcohol dependence, uncomplicated: F10.20

## 2017-10-18 MED ORDER — LIDOCAINE VISCOUS 2 % MT SOLN
20.0000 mL | Freq: Four times a day (QID) | OROMUCOSAL | 0 refills | Status: DC | PRN
Start: 2017-10-18 — End: 2018-01-06

## 2017-10-18 MED ORDER — MELOXICAM 15 MG PO TABS: 15.0000 mg | ORAL_TABLET | Freq: Every day | ORAL | 1 refills | Status: DC | PRN

## 2017-10-18 MED ORDER — BENZONATATE 100 MG PO CAPS: 100.0000 mg | ORAL_CAPSULE | Freq: Three times a day (TID) | ORAL | 0 refills | Status: DC | PRN

## 2017-10-18 MED ORDER — ATORVASTATIN CALCIUM 10 MG PO TABS: 10.0000 mg | ORAL_TABLET | ORAL | 1 refills | Status: DC

## 2017-10-18 MED ORDER — UMECLIDINIUM-VILANTEROL 62.5-25 MCG/INH IN AEPB: 1.0000 | INHALATION_SPRAY | Freq: Every day | RESPIRATORY_TRACT | 0 refills | Status: DC
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
## 2017-12-05 ENCOUNTER — Ambulatory Visit: Payer: BLUE CROSS/BLUE SHIELD | Admitting: Nurse Practitioner

## 2018-01-06 ENCOUNTER — Encounter: Payer: Self-pay | Admitting: Family Medicine

## 2018-01-06 ENCOUNTER — Ambulatory Visit (INDEPENDENT_AMBULATORY_CARE_PROVIDER_SITE_OTHER): Payer: BLUE CROSS/BLUE SHIELD | Admitting: Family Medicine

## 2018-01-06 VITALS — BP 120/66 | HR 114 | Temp 97.9°F | Resp 16 | Ht 72.0 in | Wt 204.0 lb

## 2018-01-06 DIAGNOSIS — R7989 Other specified abnormal findings of blood chemistry: Secondary | ICD-10-CM | POA: Diagnosis not present

## 2018-01-06 DIAGNOSIS — Z1389 Encounter for screening for other disorder: Secondary | ICD-10-CM

## 2018-01-06 DIAGNOSIS — F1020 Alcohol dependence, uncomplicated: Secondary | ICD-10-CM | POA: Diagnosis not present

## 2018-01-06 DIAGNOSIS — D582 Other hemoglobinopathies: Secondary | ICD-10-CM | POA: Diagnosis not present

## 2018-01-06 DIAGNOSIS — I10 Essential (primary) hypertension: Secondary | ICD-10-CM

## 2018-01-06 DIAGNOSIS — R7301 Impaired fasting glucose: Secondary | ICD-10-CM | POA: Diagnosis not present

## 2018-01-06 DIAGNOSIS — E782 Mixed hyperlipidemia: Secondary | ICD-10-CM

## 2018-01-06 DIAGNOSIS — R252 Cramp and spasm: Secondary | ICD-10-CM

## 2018-01-06 DIAGNOSIS — D7589 Other specified diseases of blood and blood-forming organs: Secondary | ICD-10-CM

## 2018-01-06 DIAGNOSIS — Z5181 Encounter for therapeutic drug level monitoring: Secondary | ICD-10-CM

## 2018-01-06 NOTE — Assessment & Plan Note (Signed)
Check Mg2+ 
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
## 2018-01-06 NOTE — Assessment & Plan Note (Signed)
Check labs today.
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
## 2018-01-06 NOTE — Assessment & Plan Note (Signed)
Continue same medicine; limit salt

## 2018-01-06 NOTE — Assessment & Plan Note (Signed)
Check A1c today, avoid sugary drinks

## 2018-01-06 NOTE — Assessment & Plan Note (Signed)
Check lipids 
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
## 2018-01-06 NOTE — Assessment & Plan Note (Signed)
Check labs 

## 2018-01-07 ENCOUNTER — Other Ambulatory Visit: Payer: Self-pay | Admitting: Family Medicine

## 2018-01-07 MED ORDER — POTASSIUM CHLORIDE CRYS ER 20 MEQ PO TBCR: 20.0000 meq | EXTENDED_RELEASE_TABLET | Freq: Two times a day (BID) | ORAL | 0 refills | Status: DC

## 2018-01-07 NOTE — Progress Notes (Signed)
Rx for K+ sent to pharmacy

## 2018-01-09 ENCOUNTER — Telehealth: Payer: Self-pay

## 2018-01-09 DIAGNOSIS — D582 Other hemoglobinopathies: Secondary | ICD-10-CM

## 2018-01-09 DIAGNOSIS — R718 Other abnormality of red blood cells: Secondary | ICD-10-CM

## 2018-01-09 NOTE — Telephone Encounter (Signed)
-----   Message from Arnetha Courser, MD sent at 01/07/2018  4:44 PM EDT ----- Roselyn Reef, please let the patient know that his magnesium is normal. His vitamin B12 is low. Start vitamin B12 500 or 1000 mcg and take daily, along with 346 mcg of folic acid. His white blood cells, hemoglobin, and hematocrit are high. I'd like him to see a hematologist and get a sleep study. Please REFER to hematology and pulmonology (Dr. Ashby Dawes) for elevated H/H. His glucose was high, but the A1c is normal, so this does not appear to be diabetes. His potassium is low, so I'm sending in some potassium to take for a few days. I sent it to the main pharmacy listed. If wrong one, please remove that from list and call in to correct pharmacy (he has more than one listed). His cholesterol is okay. Thank you

## 2018-01-11 LAB — HEMOGLOBIN A1C
HEMOGLOBIN A1C: 5.2 %{Hb} (ref <5.7)
Mean Plasma Glucose: 103 (calc)
eAG (mmol/L): 5.7 (calc)

## 2018-01-11 LAB — CBC WITH DIFFERENTIAL/PLATELET
BASOS PCT: 0.4 %
Basophils Absolute: 46 cells/uL (ref 0–200)
EOS ABS: 91 {cells}/uL (ref 15–500)
Eosinophils Relative: 0.8 %
HEMATOCRIT: 53.4 % — AB (ref 38.5–50.0)
Hemoglobin: 18.6 g/dL — ABNORMAL HIGH (ref 13.2–17.1)
LYMPHS ABS: 1186 {cells}/uL (ref 850–3900)
MCH: 33.9 pg — ABNORMAL HIGH (ref 27.0–33.0)
MCHC: 34.8 g/dL (ref 32.0–36.0)
MCV: 97.4 fL (ref 80.0–100.0)
MPV: 9 fL (ref 7.5–12.5)
Monocytes Relative: 6 %
NEUTROS PCT: 82.4 %
Neutro Abs: 9394 cells/uL — ABNORMAL HIGH (ref 1500–7800)
PLATELETS: 256 10*3/uL (ref 140–400)
RBC: 5.48 10*6/uL (ref 4.20–5.80)
RDW: 12 % (ref 11.0–15.0)
TOTAL LYMPHOCYTE: 10.4 %
WBC: 11.4 10*3/uL — AB (ref 3.8–10.8)
WBCMIX: 684 {cells}/uL (ref 200–950)

## 2018-01-11 LAB — COMPLETE METABOLIC PANEL WITH GFR
AG Ratio: 1.4 (calc) (ref 1.0–2.5)
ALT: 12 U/L (ref 9–46)
AST: 16 U/L (ref 10–35)
Albumin: 4.3 g/dL (ref 3.6–5.1)
Alkaline phosphatase (APISO): 85 U/L (ref 40–115)
BUN: 14 mg/dL (ref 7–25)
CALCIUM: 9.7 mg/dL (ref 8.6–10.3)
CO2: 22 mmol/L (ref 20–32)
CREATININE: 1.2 mg/dL (ref 0.70–1.25)
Chloride: 103 mmol/L (ref 98–110)
GFR, EST AFRICAN AMERICAN: 71 mL/min/{1.73_m2} (ref >=60)
GFR, EST NON AFRICAN AMERICAN: 61 mL/min/{1.73_m2} (ref >=60)
GLOBULIN: 3.1 g/dL (ref 1.9–3.7)
Glucose, Bld: 159 mg/dL — ABNORMAL HIGH (ref 65–139)
Potassium: 3.1 mmol/L — ABNORMAL LOW (ref 3.5–5.3)
Sodium: 137 mmol/L (ref 135–146)
TOTAL PROTEIN: 7.4 g/dL (ref 6.1–8.1)
Total Bilirubin: 0.8 mg/dL (ref 0.2–1.2)

## 2018-01-11 LAB — PAIN MGMT, ALCOHOL MET. W/CONF, U
ALCOHOL METABOLITES: POSITIVE ng/mL — AB (ref <500)
ETHYL GLUCURONIDE (ETG): 38214 ng/mL — AB (ref <500)
Ethyl Sulfate (ETS): 5934 ng/mL — ABNORMAL HIGH (ref <100)

## 2018-01-11 LAB — MAGNESIUM: Magnesium: 1.8 mg/dL (ref 1.5–2.5)

## 2018-01-11 LAB — LIPID PANEL
Cholesterol: 183 mg/dL (ref <200)
HDL: 70 mg/dL (ref >40)
LDL Cholesterol (Calc): 92 mg/dL (calc)
Non-HDL Cholesterol (Calc): 113 mg/dL (calc) (ref <130)
Total CHOL/HDL Ratio: 2.6 (calc) (ref <5.0)
Triglycerides: 111 mg/dL (ref <150)

## 2018-01-11 LAB — VITAMIN B12: Vitamin B-12: 310 pg/mL (ref 200–1100)

## 2018-01-12 ENCOUNTER — Inpatient Hospital Stay: Payer: BLUE CROSS/BLUE SHIELD | Attending: Oncology | Admitting: Oncology

## 2018-01-12 ENCOUNTER — Inpatient Hospital Stay: Payer: BLUE CROSS/BLUE SHIELD

## 2018-01-12 ENCOUNTER — Encounter: Payer: Self-pay | Admitting: Oncology

## 2018-01-12 ENCOUNTER — Other Ambulatory Visit: Payer: Self-pay

## 2018-01-12 VITALS — BP 136/85 | HR 90 | Temp 97.6°F | Resp 18 | Wt 210.6 lb

## 2018-01-12 DIAGNOSIS — D72829 Elevated white blood cell count, unspecified: Secondary | ICD-10-CM

## 2018-01-12 DIAGNOSIS — D751 Secondary polycythemia: Secondary | ICD-10-CM | POA: Diagnosis not present

## 2018-01-12 LAB — CBC WITH DIFFERENTIAL/PLATELET
BASOS ABS: 0.1 10*3/uL (ref 0–0.1)
BASOS PCT: 1 %
EOS ABS: 0.2 10*3/uL (ref 0–0.7)
Eosinophils Relative: 2 %
HCT: 49.2 % (ref 40.0–52.0)
HEMOGLOBIN: 17 g/dL (ref 13.0–18.0)
LYMPHS ABS: 1.5 10*3/uL (ref 1.0–3.6)
Lymphocytes Relative: 19 %
MCH: 34.4 pg — ABNORMAL HIGH (ref 26.0–34.0)
MCHC: 34.6 g/dL (ref 32.0–36.0)
MCV: 99.3 fL (ref 80.0–100.0)
Monocytes Absolute: 0.5 10*3/uL (ref 0.2–1.0)
Monocytes Relative: 6 %
NEUTROS PCT: 72 %
Neutro Abs: 5.8 10*3/uL (ref 1.4–6.5)
Platelets: 226 10*3/uL (ref 150–440)
RBC: 4.95 MIL/uL (ref 4.40–5.90)
RDW: 13.6 % (ref 11.5–14.5)
WBC: 8.1 10*3/uL (ref 3.8–10.6)

## 2018-01-12 LAB — LACTATE DEHYDROGENASE: LDH: 120 U/L (ref 98–192)
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
## 2018-01-12 NOTE — Progress Notes (Signed)
Patient here

## 2018-01-13 ENCOUNTER — Ambulatory Visit (INDEPENDENT_AMBULATORY_CARE_PROVIDER_SITE_OTHER): Payer: BLUE CROSS/BLUE SHIELD | Admitting: Family Medicine

## 2018-01-13 ENCOUNTER — Encounter: Payer: Self-pay | Admitting: Family Medicine

## 2018-01-13 VITALS — BP 124/64 | HR 82 | Temp 98.1°F | Resp 12 | Ht 72.0 in | Wt 208.8 lb

## 2018-01-13 DIAGNOSIS — F1020 Alcohol dependence, uncomplicated: Secondary | ICD-10-CM | POA: Diagnosis not present

## 2018-01-13 DIAGNOSIS — I712 Thoracic aortic aneurysm, without rupture, unspecified: Secondary | ICD-10-CM

## 2018-01-13 DIAGNOSIS — R7989 Other specified abnormal findings of blood chemistry: Secondary | ICD-10-CM

## 2018-01-13 DIAGNOSIS — D582 Other hemoglobinopathies: Secondary | ICD-10-CM | POA: Diagnosis not present

## 2018-01-13 DIAGNOSIS — F1290 Cannabis use, unspecified, uncomplicated: Secondary | ICD-10-CM

## 2018-01-13 LAB — CARBON MONOXIDE, BLOOD (PERFORMED AT REF LAB): Carbon Monoxide, Blood: 2.7 % (ref 0.0–3.6)

## 2018-01-13 LAB — ERYTHROPOIETIN: Erythropoietin: 11.5 m[IU]/mL (ref 2.6–18.5)

## 2018-01-13 NOTE — Patient Instructions (Addendum)
I encourage you to quit drinking completely, and I am here to help you  There is a walk-in alcohol counselor who can meet with you right now; her name is Rise Paganini (or Izora Gala) and they work at [employer] Address: [address], [place], [place] [address]  Phone: [phone]  [facility] [place], [place] and [place]  HOTLINE: [phone]  [facility] [place] takes private insurance and you can walk-in there today or any time Address: [address], [place], [place] [address]  Phone: [phone]
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
## 2018-01-14 LAB — DRUG SCREEN URINE W/ALC, NO CONF
ALCOHOL, ETHYL (U): NEGATIVE
AMPHETAMINES (1000 ng/mL SCRN): NEGATIVE
BARBITURATES: NEGATIVE
BENZODIAZEPINES: NEGATIVE
COCAINE METABOLITES: NEGATIVE
MARIJUANA MET (50 ng/mL SCRN): NEGATIVE
METHADONE: NEGATIVE
METHAQUALONE: NEGATIVE
OPIATES: NEGATIVE
PHENCYCLIDINE: NEGATIVE
PROPOXYPHENE: NEGATIVE

## 2018-01-17 ENCOUNTER — Telehealth: Payer: Self-pay

## 2018-01-17 NOTE — Telephone Encounter (Signed)
Copied from [place] [phone]. Topic: Referral - Status >> Jan 17, 2018 11:32 AM Hewitt Shorts wrote: Pt states that dr Sanda Klein was supposed to finding a counselor for him but he has not heard anything  Please call [phone] or home number [phone] ok to leave a message on this number

## 2018-01-17 NOTE — Telephone Encounter (Signed)
Atre you just wanting patient to go back up to [place] for alcohol counseling?

## 2018-01-17 NOTE — Telephone Encounter (Signed)
Left voice mail

## 2018-01-17 NOTE — Telephone Encounter (Signed)
Patient needs to see the alcohol counselor at [facility]; I was given two names them (I believe [place] or [place])

## 2018-01-23 ENCOUNTER — Telehealth: Payer: Self-pay | Admitting: Family Medicine

## 2018-01-23 NOTE — Telephone Encounter (Signed)
Copied from [place] [phone]. Topic: Quick Communication - See Telephone Encounter >> Jan 23, 2018 10:33 AM Rutherford Nail, NT wrote: CRM for notification. See Telephone encounter for: 01/23/18. Patient calling and states that he just wanting to remind Dr Sanda Klein that the paperwork that is needing filled out for the [facility] is needing to be complete by Thursday 01/26/18.  CB#: [phone]

## 2018-01-24 NOTE — Telephone Encounter (Signed)
Left voice mail

## 2018-01-24 NOTE — Telephone Encounter (Signed)
I still don't see any documentation from substance abuse counselor Please have him arrange to have the notes sent over to me from their office Thank you

## 2018-01-25 ENCOUNTER — Encounter: Payer: Self-pay | Admitting: Family Medicine

## 2018-01-25 DIAGNOSIS — I712 Thoracic aortic aneurysm, without rupture, unspecified: Secondary | ICD-10-CM | POA: Insufficient documentation

## 2018-01-25 DIAGNOSIS — F1290 Cannabis use, unspecified, uncomplicated: Secondary | ICD-10-CM

## 2018-01-25 HISTORY — DX: Cannabis use, unspecified, uncomplicated: F12.90

## 2018-01-25 NOTE — Assessment & Plan Note (Signed)
Seeing hematologist
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
## 2018-01-25 NOTE — Assessment & Plan Note (Addendum)
Noted incidentally in the ER back in April; he should follow-up with vascular specialist; referral entered
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
## 2018-01-26 ENCOUNTER — Encounter: Payer: Self-pay | Admitting: Oncology

## 2018-01-26 ENCOUNTER — Other Ambulatory Visit: Payer: Self-pay

## 2018-01-26 ENCOUNTER — Telehealth: Payer: Self-pay | Admitting: Family Medicine

## 2018-01-26 ENCOUNTER — Inpatient Hospital Stay: Payer: BLUE CROSS/BLUE SHIELD | Attending: Oncology | Admitting: Oncology

## 2018-01-26 VITALS — BP 125/72 | HR 80 | Temp 97.1°F | Resp 18 | Wt 208.4 lb

## 2018-01-26 DIAGNOSIS — D729 Disorder of white blood cells, unspecified: Secondary | ICD-10-CM

## 2018-01-26 DIAGNOSIS — D751 Secondary polycythemia: Secondary | ICD-10-CM

## 2018-01-26 NOTE — Progress Notes (Signed)
Patient here for follow up. No concerns voiced.  °

## 2018-01-26 NOTE — Telephone Encounter (Signed)
Pt would like to know if he can get a refill on Methocarbamol for his muscle spasms. Pt understands he told Dr Sanda Klein he no longer needs this but right after his appt he had a muscle spasm. Pt would like this sent to the pharmacy in [place].

## 2018-01-27 ENCOUNTER — Other Ambulatory Visit: Payer: Self-pay

## 2018-01-27 LAB — JAK2 V617F, W REFLEX TO CALR/E12/MPL

## 2018-01-27 LAB — CALR + JAK2 E12-15 + MPL (REFLEXED)

## 2018-01-27 LAB — TECHNOLOGIST SMEAR REVIEW

## 2018-01-27 LAB — PATHOLOGIST SMEAR REVIEW

## 2018-01-27 NOTE — Telephone Encounter (Signed)
Refill request for general medication: Methocarbamol 500 mg  Last office visit: 01/13/2018  Last physical exam: None indicated  No follow-ups on file.
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
## 2018-02-03 ENCOUNTER — Encounter (INDEPENDENT_AMBULATORY_CARE_PROVIDER_SITE_OTHER): Payer: BLUE CROSS/BLUE SHIELD | Admitting: Vascular Surgery

## 2018-02-13 ENCOUNTER — Institutional Professional Consult (permissible substitution): Payer: Medicare Other | Admitting: Internal Medicine

## 2018-02-24 ENCOUNTER — Encounter (INDEPENDENT_AMBULATORY_CARE_PROVIDER_SITE_OTHER): Payer: Self-pay | Admitting: Vascular Surgery

## 2018-02-24 ENCOUNTER — Ambulatory Visit (INDEPENDENT_AMBULATORY_CARE_PROVIDER_SITE_OTHER): Payer: BLUE CROSS/BLUE SHIELD | Admitting: Vascular Surgery

## 2018-02-24 VITALS — BP 131/70 | HR 86 | Resp 16 | Ht 72.0 in | Wt 208.8 lb

## 2018-02-24 DIAGNOSIS — I10 Essential (primary) hypertension: Secondary | ICD-10-CM | POA: Diagnosis not present

## 2018-02-24 DIAGNOSIS — I712 Thoracic aortic aneurysm, without rupture, unspecified: Secondary | ICD-10-CM

## 2018-02-24 DIAGNOSIS — E782 Mixed hyperlipidemia: Secondary | ICD-10-CM | POA: Diagnosis not present

## 2018-02-24 NOTE — Assessment & Plan Note (Signed)
lipid control important in reducing the progression of atherosclerotic disease. Continue statin therapy  

## 2018-02-24 NOTE — Assessment & Plan Note (Signed)
The patient has a 3.96 cm ascending aorta which just barely qualifies as an aneurysm.  This was seen about 6 months ago on a CT scan which I have independently reviewed.  I have discussed the pathophysiology and natural history of a sending thoracic aortic aneurysms.  This obviously poses him no risk at this point.  We discussed the paramount importance of blood pressure control and keeping this from growing.  I will see him back in 1 year with a CT scan of the chest.

## 2018-02-24 NOTE — Patient Instructions (Signed)
Thoracic Aortic Aneurysm An aneurysm is a bulge in an artery. It happens when blood pushes up against a weakened or damaged artery wall. A thoracic aortic aneurysm is an aneurysm that occurs in the first part of the aorta, between the heart and the diaphragm. The aorta is the main artery of the body. It supplies blood from the heart to the rest of the body. Some aneurysms may not cause symptoms or problems. However, the major concern with a thoracic aortic aneurysm is that it can enlarge and burst (rupture), or blood can flow between the layers of the wall of the aorta through a tear (aorticdissection). Both of these conditions can cause bleeding inside the body and can be life-threatening if they are not diagnosed and treated right away. What are the causes? The exact cause of this condition is not known. What increases the risk? The following factors may make you more likely to develop this condition:  Being age 65 or older.  Having a hardening of the arteries caused by the buildup of fat and other substances in the lining of a blood vessel (arteriosclerosis).  Having inflammation of the walls of an artery (arteritis).  Having a genetic disease that weakens the body's connective tissue, such as Marfan syndrome.  Having an injury or trauma to the aorta.  Having an infection that is caused by bacteria, such as syphilis or staphylococcus, in the wall of the aorta (infectious aortitis).  Having high blood pressure (hypertension).  Being male.  Being white (Caucasian).  Having high cholesterol.  Having a family history of aneurysms.  Using tobacco.  Having chronic obstructive pulmonary disease (COPD).  What are the signs or symptoms? Symptoms of this condition vary depending on the size and rate of growth of the aneurysm. Most grow slowly and do not cause any symptoms. When symptoms do occur, they may include:  Pain in the chest, back, sides, or abdomen. The pain may vary in  intensity. A sudden onset of severe pain may indicate that the aneurysm has ruptured.  Hoarseness.  Cough.  Shortness of breath.  Swallowing problems.  Swelling in the face, arms, or legs.  Fever.  Unexplained weight loss.  How is this diagnosed? This condition may be diagnosed with:  An ultrasound.  X-rays.  A CT scan.  An MRI.  Tests to check the arteries for damage or blockages (angiogram).  Most unruptured thoracic aortic aneurysms cause no symptoms, so they are often found during exams for other conditions. How is this treated? Treatment for this condition depends on:  The size of the aneurysm.  How fast the aneurysm is growing.  Your age.  Risk factors for rupture.  Aneurysms that are smaller than 2.2 inches (5.5 cm) may be managed by using medicines to control blood pressure, manage pain, or fight infection. You may need regular monitoring to see if the aneurysm is getting bigger. Your health care provider may recommend that you have an ultrasound every year or every 6 months. How often you need to have an ultrasound depends on the size of the aneurysm, how fast it is growing, and whether you have a family history of aneurysms. Surgical repair may be needed if your aneurysm is larger than 2.2 inches or if it is growing quickly. Follow these instructions at home: Eating and drinking  Eat a healthy diet. Your health care provider may recommend that you: ? Lower your salt (sodium) intake. In some people, too much salt can raise blood pressure and increase   the risk of thoracic aortic aneurysm. ? Avoid foods that are high in saturated fat and cholesterol, such as red meat and dairy. ? Eat a diet that is low in sugar. ? Increase your fiber intake by including whole grains, vegetables, and fruits in your diet. Eating these foods may help to lower blood pressure.  Limit or avoid alcohol as recommended by your health care provider. Lifestyle  Follow instructions  from your health care provider about healthy lifestyle habits. Your health care provider may recommend that you: ? Do not use any products that contain nicotine or tobacco, such as cigarettes and e-cigarettes. If you need help quitting, ask your health care provider. ? Keep your blood pressure within normal limits. The target limit for most people is below 120/80. Check your blood pressure regularly. If it is high, ask your health care provider about ways that you can control it. ? Keep your blood sugar (glucose) level and cholesterol levels within normal limits. Target limits for most people are:  Blood glucose level: Less than 100 mg/dL.  Total cholesterol level: Less than 200 mg/dL. ? Maintain a healthy weight. Activity  Stay physically active and exercise regularly. Talk with your health care provider about how often you should exercise and ask which types of exercise are safe for you.  Avoid heavy lifting and activities that take a lot of effort (are strenuous). Ask your health care provider what activities are safe for you. General instructions  Keep all follow-up visits as told by your health care provider. This is important. ? Talk with your health care provider about regular screenings to see if the aneurysm is getting bigger.  Take over-the-counter and prescription medicines only as told by your health care provider. Contact a health care provider if:  You have discomfort in your upper back, neck, or abdomen.  You have trouble swallowing.  You have a cough or hoarseness.  You have a family history of aneurysms.  You have unexplained weight loss. Get help right away if:  You have sudden, severe pain in your upper back and abdomen. This pain may move into your chest and arms.  You have shortness of breath.  You have a fever. This information is not intended to replace advice given to you by your health care provider. Make sure you discuss any questions you have with your  health care provider. Document Released: 06/07/2005 Document Revised: 03/19/2016 Document Reviewed: 03/19/2016 [employer] Interactive Patient Education  2018 [employer]  

## 2018-02-24 NOTE — Progress Notes (Signed)
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    [id number] This test was developed and its performance characteristics determined by [facility]. It has not been cleared or approved by the Food and Drug Administration.    Extraction Comment     Comment: (NOTE) This sample has been received and DNA extraction has been performed. Performed At: [facility] [address] [place], [place] [id number] Nechama Guard MD YC:[id number] Performed At: [facility] [place], [place] [id number] Nechama Guard MD  HY:[id number]   Urine Drug Screen w/Alc, no confirm     Status: None   Collection Time: 01/13/18 11:48 AM  Result Value Ref Range   Please note      Comment: .    * These results are for medical treatment only.  *    * Analysis was performed as non-forensic testing. * .    AMPHETAMINES (1000 ng/mL SCRN)  NEGATIVE    BARBITURATES NEGATIVE    BENZODIAZEPINES NEGATIVE    COCAINE METABOLITES NEGATIVE    MARIJUANA MET (50 ng/mL SCRN) NEGATIVE    METHADONE NEGATIVE    METHAQUALONE NEGATIVE    OPIATES NEGATIVE    PHENCYCLIDINE NEGATIVE    PROPOXYPHENE NEGATIVE    ALCOHOL, ETHYL (U) NEGATIVE     Comment: .  THE SUBMITTED URINE SPECIMEN WAS TESTED AT THE LISTED CUTOFF  LEVELS.  Marland Kitchen   DRUG CLASS            INITIAL CUTOFF                             LEVEL   AMPHETAMINES             1000 ng/mL   BARBITURATES              300 ng/mL  BENZODIAZEPINES           300 ng/mL  COCAINE METABOLITES       300 ng/mL  MARIJUANA METABOLITES      50 ng/mL  METHADONE                 300 ng/mL  METHAQUALONE              300 ng/mL  OPIATES                   300 ng/mL  PHENCYCLIDINE              25 ng/mL  PROPOXYPHENE              300 ng/mL  ETHANOL                   .02  g/dL  *  PLEASE READ THIS IMPORTANT MESSAGE:  THIS DRUG SCREEN IS FOR MEDICAL USE ONLY.  THE RESULTS ARE  PRESUMPTIVE; BASED ONLY ON SCREENING METHODS, AND THEY HAVE  NOT BEEN CONFIRMED BY A SECOND INDEPENDENT CHEMICAL METHOD.  THESE RESULTS SHOULD BE USED ONLY BY PHYSICIANS TO RENDER  DIAGNOSIS OR TREATMENT, OR TO MONITOR PROGRESS OF MEDICAL  CONDITIONS .   *    Assessment/Plan:  Hyperlipidemia lipid control important in reducing the progression of atherosclerotic disease. Continue statin therapy   Hypertension blood pressure control important in reducing the progression of atherosclerotic disease and aneurysmal degeneration. On appropriate oral medications.   Thoracic aortic aneurysm without rupture ([place]) The patient  has a 3.96 cm ascending aorta which just barely qualifies as an aneurysm.  This was seen about 6 months ago on a CT scan which I have independently reviewed.  I have discussed the pathophysiology and natural history of a sending thoracic aortic aneurysms.  This obviously poses him no risk at this point.  We discussed the paramount importance of blood pressure control and keeping this from growing.  I will see him back in 1 year with a CT scan of the chest.      Leotis Pain 02/24/2018, 3:00 PM   This note was created with Dragon medical transcription system.  Any errors from dictation are unintentional.

## 2018-02-24 NOTE — Assessment & Plan Note (Signed)
blood pressure control important in reducing the progression of atherosclerotic disease and aneurysmal degeneration. On appropriate oral medications.  

## 2018-03-10 ENCOUNTER — Encounter: Payer: Self-pay | Admitting: Internal Medicine

## 2018-03-10 ENCOUNTER — Ambulatory Visit (INDEPENDENT_AMBULATORY_CARE_PROVIDER_SITE_OTHER): Payer: BLUE CROSS/BLUE SHIELD | Admitting: Internal Medicine

## 2018-03-10 VITALS — Resp 16 | Ht 72.0 in | Wt 211.0 lb

## 2018-03-10 DIAGNOSIS — G4719 Other hypersomnia: Secondary | ICD-10-CM

## 2018-03-10 NOTE — Addendum Note (Signed)
Addended by: Stephanie Coup on: 03/10/2018 10:39 AM   Modules accepted: Orders

## 2018-03-10 NOTE — Patient Instructions (Signed)
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
## 2018-03-14 ENCOUNTER — Encounter: Payer: Self-pay | Admitting: Internal Medicine

## 2018-03-14 DIAGNOSIS — G4733 Obstructive sleep apnea (adult) (pediatric): Secondary | ICD-10-CM | POA: Diagnosis not present

## 2018-03-14 DIAGNOSIS — G4719 Other hypersomnia: Secondary | ICD-10-CM

## 2018-03-16 ENCOUNTER — Telehealth: Payer: Self-pay | Admitting: *Deleted

## 2018-03-16 DIAGNOSIS — G4733 Obstructive sleep apnea (adult) (pediatric): Secondary | ICD-10-CM | POA: Diagnosis not present

## 2018-03-16 NOTE — Telephone Encounter (Signed)
Pt aware Orders placed Nothing further needed. 

## 2018-03-16 NOTE — Telephone Encounter (Signed)
Patient returning our call

## 2018-03-16 NOTE — Telephone Encounter (Signed)
Left message to call back for results.  HST results  Severe OSA with AHI of 59 Recommend auto-cpap with pressure range of 5-20 cmH20.Marland Kitchen

## 2018-03-20 ENCOUNTER — Telehealth: Payer: Self-pay | Admitting: Internal Medicine

## 2018-03-20 NOTE — Telephone Encounter (Signed)
Paperwork placed in DR folder for completion.

## 2018-03-20 NOTE — Telephone Encounter (Signed)
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

## 2018-03-21 NOTE — Telephone Encounter (Signed)
LM on VM that paperwork is ready to pick up at front desk.

## 2018-03-23 ENCOUNTER — Other Ambulatory Visit: Payer: Self-pay | Admitting: Family Medicine
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
## 2018-03-24 NOTE — Telephone Encounter (Signed)
Rx sent Referral entered

## 2018-03-31 ENCOUNTER — Inpatient Hospital Stay (HOSPITAL_BASED_OUTPATIENT_CLINIC_OR_DEPARTMENT_OTHER): Payer: BLUE CROSS/BLUE SHIELD | Admitting: Oncology

## 2018-03-31 ENCOUNTER — Inpatient Hospital Stay: Payer: BLUE CROSS/BLUE SHIELD | Attending: Oncology

## 2018-03-31 ENCOUNTER — Inpatient Hospital Stay: Payer: BLUE CROSS/BLUE SHIELD | Admitting: Oncology

## 2018-03-31 ENCOUNTER — Encounter: Payer: Self-pay | Admitting: Oncology

## 2018-03-31 ENCOUNTER — Inpatient Hospital Stay: Payer: BLUE CROSS/BLUE SHIELD

## 2018-03-31 ENCOUNTER — Other Ambulatory Visit: Payer: Self-pay

## 2018-03-31 VITALS — BP 130/75 | HR 79 | Temp 96.7°F | Resp 18 | Wt 213.4 lb

## 2018-03-31 DIAGNOSIS — D729 Disorder of white blood cells, unspecified: Secondary | ICD-10-CM

## 2018-03-31 DIAGNOSIS — G4730 Sleep apnea, unspecified: Secondary | ICD-10-CM | POA: Insufficient documentation

## 2018-03-31 DIAGNOSIS — Z87891 Personal history of nicotine dependence: Secondary | ICD-10-CM

## 2018-03-31 DIAGNOSIS — D751 Secondary polycythemia: Secondary | ICD-10-CM | POA: Insufficient documentation

## 2018-03-31 LAB — CBC WITH DIFFERENTIAL/PLATELET
ABS IMMATURE GRANULOCYTES: 0.02 10*3/uL (ref 0.00–0.07)
Basophils Absolute: 0.1 10*3/uL (ref 0.0–0.1)
Basophils Relative: 1 %
Eosinophils Absolute: 0.2 10*3/uL (ref 0.0–0.5)
Eosinophils Relative: 2 %
HCT: 47.1 % (ref 39.0–52.0)
Hemoglobin: 16 g/dL (ref 13.0–17.0)
Immature Granulocytes: 0 %
LYMPHS ABS: 1.8 10*3/uL (ref 0.7–4.0)
LYMPHS PCT: 24 %
MCH: 33.1 pg (ref 26.0–34.0)
MCHC: 34 g/dL (ref 30.0–36.0)
MCV: 97.5 fL (ref 80.0–100.0)
MONO ABS: 0.6 10*3/uL (ref 0.1–1.0)
MONOS PCT: 8 %
NEUTROS ABS: 4.8 10*3/uL (ref 1.7–7.7)
Neutrophils Relative %: 65 %
Platelets: 214 10*3/uL (ref 150–400)
RBC: 4.83 MIL/uL (ref 4.22–5.81)
RDW: 12.7 % (ref 11.5–15.5)
WBC: 7.4 10*3/uL (ref 4.0–10.5)
nRBC: 0 % (ref 0.0–0.2)
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
## 2018-03-31 NOTE — Progress Notes (Signed)
Patient here for follow up. Complains of pain to right knee, pain radiates down the leg. Has appt with orthopedics on Tuesday.

## 2018-04-04 DIAGNOSIS — M1710 Unilateral primary osteoarthritis, unspecified knee: Secondary | ICD-10-CM | POA: Insufficient documentation

## 2018-04-04 DIAGNOSIS — M179 Osteoarthritis of knee, unspecified: Secondary | ICD-10-CM | POA: Insufficient documentation

## 2018-04-04 DIAGNOSIS — M170 Bilateral primary osteoarthritis of knee: Secondary | ICD-10-CM | POA: Diagnosis not present

## 2018-05-27 ENCOUNTER — Other Ambulatory Visit: Payer: Self-pay | Admitting: Family Medicine

## 2018-05-27 DIAGNOSIS — E782 Mixed hyperlipidemia: Secondary | ICD-10-CM

## 2018-06-01 ENCOUNTER — Other Ambulatory Visit: Payer: Self-pay | Admitting: Family Medicine

## 2018-06-05 ENCOUNTER — Other Ambulatory Visit: Payer: Self-pay | Admitting: Family Medicine

## 2018-06-27 ENCOUNTER — Other Ambulatory Visit: Payer: Self-pay | Admitting: Family Medicine

## 2018-06-27 NOTE — Telephone Encounter (Signed)
Lab Results  Component Value Date   CREATININE 1.20 01/06/2018   Last visit here was July 2019 Please ask pt to schedule an appointment for later this month I'll refill med and we look forward to seeing him

## 2018-07-14 ENCOUNTER — Ambulatory Visit (INDEPENDENT_AMBULATORY_CARE_PROVIDER_SITE_OTHER): Payer: BLUE CROSS/BLUE SHIELD | Admitting: Family Medicine

## 2018-07-14 ENCOUNTER — Encounter: Payer: Self-pay | Admitting: Family Medicine

## 2018-07-14 VITALS — BP 122/70 | HR 96 | Temp 98.2°F | Resp 12 | Ht 72.0 in | Wt 214.7 lb

## 2018-07-14 DIAGNOSIS — I712 Thoracic aortic aneurysm, without rupture, unspecified: Secondary | ICD-10-CM

## 2018-07-14 DIAGNOSIS — R911 Solitary pulmonary nodule: Secondary | ICD-10-CM | POA: Insufficient documentation

## 2018-07-14 DIAGNOSIS — E876 Hypokalemia: Secondary | ICD-10-CM | POA: Diagnosis not present

## 2018-07-14 DIAGNOSIS — G4733 Obstructive sleep apnea (adult) (pediatric): Secondary | ICD-10-CM | POA: Diagnosis not present

## 2018-07-14 DIAGNOSIS — R7301 Impaired fasting glucose: Secondary | ICD-10-CM

## 2018-07-14 DIAGNOSIS — R7303 Prediabetes: Secondary | ICD-10-CM

## 2018-07-14 DIAGNOSIS — Z5181 Encounter for therapeutic drug level monitoring: Secondary | ICD-10-CM | POA: Diagnosis not present

## 2018-07-14 DIAGNOSIS — E782 Mixed hyperlipidemia: Secondary | ICD-10-CM

## 2018-07-14 DIAGNOSIS — I10 Essential (primary) hypertension: Secondary | ICD-10-CM

## 2018-07-14 DIAGNOSIS — D582 Other hemoglobinopathies: Secondary | ICD-10-CM

## 2018-07-14 HISTORY — DX: Obstructive sleep apnea (adult) (pediatric): G47.33

## 2018-07-14 NOTE — Assessment & Plan Note (Signed)
Seeing vascular doctor; stressed to patient the importance of keeping those regular follow-ups

## 2018-07-14 NOTE — Assessment & Plan Note (Signed)
Check A1c today.

## 2018-07-14 NOTE — Assessment & Plan Note (Signed)
Noted on scan from April 2019; hematologist-oncologist mentions this in her note specifically, that they will follow-up with a noncontrast CT of the chest 12 months after that (due April 2020); I noted in patient's after visit summary that he should follow-up with her office to get that scheduled
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
## 2018-07-14 NOTE — Assessment & Plan Note (Signed)
Monitored by Dr. Tasia Catchings, hematologist

## 2018-07-14 NOTE — Assessment & Plan Note (Signed)
Controlled with current regimen; continue same; not adding salt

## 2018-07-14 NOTE — Assessment & Plan Note (Signed)
Check lipids today 

## 2018-07-14 NOTE — Assessment & Plan Note (Signed)
Managed by pulmonologist; I specifically asked the patient if there was anything I needed to do, get involved in any way, and he said no (CMA present)

## 2018-07-14 NOTE — Patient Instructions (Addendum)
We'll get labs today and contact you with those results  If you have not heard anything from my staff in a week about any orders/referrals/studies from today, please contact us here to follow-up [phone]  Let the staff know what information you want released to the [facility] and they can arrange for that to be done with your consent  If you would like to see another doctor, just let us know and we'll be glad to release your records with the appropriate signed form  Please do see Dr. Lucky Cowboy (vascular specialist) for regular follow-up per his instructions  Try to limit saturated fats in your diet (bologna, hot dogs, barbeque, cheeseburgers, hamburgers, steak, bacon, sausage, cheese, etc.) and get more fresh fruits, vegetables, and whole grains  You will be due for a noncontrast chest CT in April of this year; please contact Dr. Collie Siad office to arrange to have that done

## 2018-07-15 LAB — COMPLETE METABOLIC PANEL WITH GFR
AG RATIO: 1.4 (calc) (ref 1.0–2.5)
ALBUMIN MSPROF: 4.2 g/dL (ref 3.6–5.1)
ALT: 15 U/L (ref 9–46)
AST: 20 U/L (ref 10–35)
Alkaline phosphatase (APISO): 64 U/L (ref 40–115)
BILIRUBIN TOTAL: 0.5 mg/dL (ref 0.2–1.2)
BUN: 8 mg/dL (ref 7–25)
CALCIUM: 9.1 mg/dL (ref 8.6–10.3)
CHLORIDE: 104 mmol/L (ref 98–110)
CO2: 27 mmol/L (ref 20–32)
Creat: 0.86 mg/dL (ref 0.70–1.18)
GFR, EST AFRICAN AMERICAN: 102 mL/min/{1.73_m2} (ref >=60)
GFR, EST NON AFRICAN AMERICAN: 88 mL/min/{1.73_m2} (ref >=60)
Globulin: 3 g/dL (calc) (ref 1.9–3.7)
Glucose, Bld: 86 mg/dL (ref 65–99)
POTASSIUM: 3.8 mmol/L (ref 3.5–5.3)
Sodium: 142 mmol/L (ref 135–146)
TOTAL PROTEIN: 7.2 g/dL (ref 6.1–8.1)

## 2018-07-15 LAB — MAGNESIUM: Magnesium: 1.9 mg/dL (ref 1.5–2.5)

## 2018-07-15 LAB — HEMOGLOBIN A1C
Hgb A1c MFr Bld: 5.5 % of total Hgb (ref <5.7)
Mean Plasma Glucose: 111 (calc)
eAG (mmol/L): 6.2 (calc)

## 2018-07-15 LAB — LIPID PANEL
Cholesterol: 167 mg/dL (ref <200)
HDL: 62 mg/dL (ref >40)
LDL Cholesterol (Calc): 89 mg/dL (calc)
Non-HDL Cholesterol (Calc): 105 mg/dL (calc) (ref <130)
TRIGLYCERIDES: 69 mg/dL (ref <150)
Total CHOL/HDL Ratio: 2.7 (calc) (ref <5.0)

## 2018-07-17 ENCOUNTER — Other Ambulatory Visit: Payer: Self-pay | Admitting: Family Medicine

## 2018-07-17 ENCOUNTER — Other Ambulatory Visit: Payer: Self-pay

## 2018-07-17 DIAGNOSIS — M19042 Primary osteoarthritis, left hand: Principal | ICD-10-CM

## 2018-07-17 DIAGNOSIS — M19041 Primary osteoarthritis, right hand: Secondary | ICD-10-CM

## 2018-07-17 DIAGNOSIS — E782 Mixed hyperlipidemia: Secondary | ICD-10-CM

## 2018-07-17 MED ORDER — BENAZEPRIL HCL 20 MG PO TABS: 20.0000 mg | ORAL_TABLET | Freq: Every day | ORAL | 0 refills | Status: DC

## 2018-07-17 MED ORDER — MELOXICAM 15 MG PO TABS: 15.0000 mg | ORAL_TABLET | Freq: Every day | ORAL | 0 refills | Status: DC | PRN

## 2018-07-17 MED ORDER — ATORVASTATIN CALCIUM 10 MG PO TABS: 10.0000 mg | ORAL_TABLET | Freq: Every day | ORAL | 1 refills | Status: DC

## 2018-07-17 NOTE — Telephone Encounter (Signed)
Lab Results  Component Value Date   CREATININE 0.86 07/14/2018

## 2018-08-23 ENCOUNTER — Telehealth: Payer: Self-pay | Admitting: Family Medicine

## 2018-08-23 NOTE — Telephone Encounter (Signed)
I left a message asking the patient to call and schedule AWV-I with Nurse Health Advisor, [place]. VDM (DD)

## 2018-08-26 DIAGNOSIS — G4733 Obstructive sleep apnea (adult) (pediatric): Secondary | ICD-10-CM | POA: Diagnosis not present

## 2018-08-29 ENCOUNTER — Other Ambulatory Visit: Payer: Self-pay | Admitting: Family Medicine

## 2018-08-30 ENCOUNTER — Ambulatory Visit (INDEPENDENT_AMBULATORY_CARE_PROVIDER_SITE_OTHER): Payer: BLUE CROSS/BLUE SHIELD | Admitting: Internal Medicine

## 2018-08-30 ENCOUNTER — Encounter: Payer: Self-pay | Admitting: Internal Medicine

## 2018-08-30 ENCOUNTER — Other Ambulatory Visit: Payer: Self-pay

## 2018-08-30 VITALS — BP 130/72 | HR 95 | Ht 72.0 in | Wt 213.6 lb

## 2018-08-30 DIAGNOSIS — G4733 Obstructive sleep apnea (adult) (pediatric): Secondary | ICD-10-CM | POA: Diagnosis not present
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
## 2018-08-30 NOTE — Patient Instructions (Addendum)
Continue to use CPAP every night for the entire night.  Will increase pressure range to 9-20 cm H2O.   Contact us in 2 months to provide you with a 3 month download to provide to [facility].

## 2018-08-30 NOTE — Telephone Encounter (Signed)
Patient reported that he might be transferring care at the last visit Please find out where he stands with that If he has transferred, please have new doctor fill his BP medicine If he has not transferred, okay to refill Benjamine Mola can do that, if she will) and remind him of last notation to have lipids checked 6 weeks after you spoke with him in January Copied: "then ORDER recheck lipids in 6 weeks; if he is transferring care, we'll encourage him to have his new provider check his cholesterol after he has been on the statin in 6 weeks" (from 07/17/2018)

## 2018-08-31 NOTE — Telephone Encounter (Signed)
Pt called.  States that he was just upset and said that he was going to transfer care but he states he will now since it seems that PCP doesn't want to help him - but states he still needs his blood pressure medication filled. Pt can be reached at [phone], he prefers to communicate through text.

## 2018-08-31 NOTE — Telephone Encounter (Signed)
Left detailed voicemail

## 2018-09-08 ENCOUNTER — Ambulatory Visit: Payer: BLUE CROSS/BLUE SHIELD

## 2018-09-11 ENCOUNTER — Other Ambulatory Visit: Payer: Self-pay | Admitting: Nurse Practitioner

## 2018-09-14 ENCOUNTER — Ambulatory Visit (INDEPENDENT_AMBULATORY_CARE_PROVIDER_SITE_OTHER): Payer: BLUE CROSS/BLUE SHIELD

## 2018-09-14 ENCOUNTER — Other Ambulatory Visit: Payer: Self-pay

## 2018-09-14 VITALS — BP 132/72 | Ht 72.0 in | Wt 212.0 lb

## 2018-09-14 DIAGNOSIS — Z0000 Encounter for general adult medical examination without abnormal findings: Secondary | ICD-10-CM | POA: Diagnosis not present

## 2018-09-14 NOTE — Progress Notes (Addendum)
Subjective:   James Medina is a 73 y.o. male who presents for an Initial Medicare Annual Wellness Visit.  This visit is being conducted via telephone due to the COVID-19 pandemic. This patient has given me verbal consent via telephone to conduct this visit. Some vital signs may be absent or patient reported.    Review of Systems  Cardiac Risk Factors include: advanced age (>44men, >60 women);dyslipidemia;hypertension;male gender    Objective:    Today's Vitals   09/14/18 1358  BP: 132/72  Weight: 212 lb (96.2 kg)  Height: 6' (1.829 m)   Body mass index is 28.75 kg/m.  Advanced Directives 09/14/2018 03/31/2018 01/26/2018 01/12/2018 10/17/2017 04/05/2017 03/25/2017  Does Patient Have a Medical Advance Directive? No No No No No No No  Would patient like information on creating a medical advance directive? No - Patient declined - - - - - -    Current Medications (verified) Outpatient Encounter Medications as of 09/14/2018  Medication Sig  . aspirin EC 81 MG tablet Take 1 tablet (81 mg total) by mouth daily. Take at least 1 hour before meloxicam  . atorvastatin (LIPITOR) 10 MG tablet Take 1 tablet (10 mg total) by mouth at bedtime. Do NOT take for 3 days if you take colchicine (Patient taking differently: Take 10 mg by mouth at bedtime. Do NOT take for 3 days if you take colchicine. Pt taking every other day)  . benazepril (LOTENSIN) 20 MG tablet TAKE 1 TABLET BY MOUTH DAILY  . meloxicam (MOBIC) 15 MG tablet Take 1 tablet (15 mg total) by mouth daily as needed.  . colchicine 0.6 MG tablet Take two pills at the onset of a gout attack; take one more pill one hour later (Patient not taking: Reported on 09/14/2018)  . methocarbamol (ROBAXIN) 500 MG tablet Take 500 mg by mouth every 6 (six) hours as needed for muscle spasms.  . tadalafil (CIALIS) 20 MG tablet One by mouth every other day PRN; do not take along with the 5 mg (one or the other) (Patient not taking: Reported on 09/14/2018)   No  facility-administered encounter medications on file as of 09/14/2018.     Allergies (verified) Patient has no known allergies.   History: Past Medical History:  Diagnosis Date  . ED (erectile dysfunction)   . Hyperlipidemia   . Hypertension   . Impotence   . OA (osteoarthritis)    hand  . Obstructive sleep apnea 07/14/2018   Managed by pulmonologist  . Sleep apnea    Past Surgical History:  Procedure Laterality Date  . APPENDECTOMY    . broken ankle     Family History  Problem Relation Age of Onset  . Cancer Mother        leukemia  . Heart disease Father   . Learning disabilities Maternal Uncle   . Diabetes Brother   . Stroke Brother   . Diabetes Brother   . Cancer Maternal Grandmother   . Emphysema Maternal Grandfather   . Heart disease Sister        pacemaker  . COPD Neg Hx   . Hypertension Neg Hx    Social History   Socioeconomic History  . Marital status: Married    Spouse name: Not on file  . Number of children: 2  . Years of education: Not on file  . Highest education level: Some college, no degree  Occupational History  . Not on file  Social Needs  . Financial resource strain: Not hard  at all  . Food insecurity:    Worry: Never true    Inability: Never true  . Transportation needs:    Medical: No    Non-medical: No  Tobacco Use  . Smoking status: Former Smoker    Types: Cigars    Last attempt to quit: 04/06/2012    Years since quitting: 6.4  . Smokeless tobacco: Current User    Types: Chew  . Tobacco comment: small amount per pt  Substance and Sexual Activity  . Alcohol use: Not Currently    Comment: occasionally wine onhce 1 month   . Drug use: Not Currently  . Sexual activity: Yes  Lifestyle  . Physical activity:    Days per week: 0 days    Minutes per session: 0 min  . Stress: Not on file  Relationships  . Social connections:    Talks on phone: More than three times a week    Gets together: Three times a week    Attends religious  service: Never    Active member of club or organization: No    Attends meetings of clubs or organizations: Never    Relationship status: Married  Other Topics Concern  . Not on file  Social History Narrative   Married, still works as Museum/gallery conservator.    Tobacco Counseling Ready to quit: No Counseling given: Not Answered Comment: small amount per pt   Clinical Intake:  Pre-visit preparation completed: Yes  Pain : No/denies pain     Nutritional Risks: None Diabetes: No     Interpreter Needed?: No  Information entered by :: Clemetine Marker LPN  Activities of Daily Living In your present state of health, do you have any difficulty performing the following activities: 09/14/2018 07/14/2018  Hearing? N N  Comment declines hearing -  Vision? N N  Comment wears glasses -  Difficulty concentrating or making decisions? N N  Walking or climbing stairs? N N  Dressing or bathing? N N  Doing errands, shopping? N N  Preparing Food and eating ? N -  Using the Toilet? N -  In the past six months, have you accidently leaked urine? N -  Do you have problems with loss of bowel control? N -  Managing your Medications? N -  Managing your Finances? N -  Housekeeping or managing your Housekeeping? N -  Some recent data might be hidden     Immunizations and Health Maintenance Immunization History  Administered Date(s) Administered  . Influenza, High Dose Seasonal PF 03/25/2017  . Tdap 06/21/2009, 10/17/2017   There are no preventive care reminders to display for this patient.  Patient Care Team: Lada, Satira Anis, MD as PCP - General (Family Medicine)  Indicate any recent Medical Services you may have received from other than [facility] providers in the past year (date may be approximate).    Assessment:   This is a routine wellness examination for James Medina.  Hearing/Vision screen Hearing Screening Comments: Pt denies hearing difficulty  Vision Screening Comments: Annual vision  screenings done at [facility] on [place] issues and exercise activities discussed: Current Exercise Habits: The patient has a physically strenuous job, but has no regular exercise apart from work., Exercise limited by: None identified  Goals   None    Depression Screen PHQ 2/9 Scores 09/14/2018 07/14/2018 04/05/2017 03/25/2017  PHQ - 2 Score 0 0 0 0  PHQ- 9 Score - 0 - -    Fall Risk Fall Risk  09/14/2018  07/14/2018 01/06/2018 01/06/2018 10/18/2017  Falls in the past year? 0 0 No No No  Number falls in past yr: 0 0 - - -  Injury with Fall? 0 0 - - -  Follow up Falls prevention discussed - - - -    FALL RISK PREVENTION PERTAINING TO THE HOME:  Any stairs in or around the home? Yes  2 steps outside If so, do they handrails? No   Home free of loose throw rugs in walkways, pet beds, electrical cords, etc? Yes  Adequate lighting in your home to reduce risk of falls? Yes   ASSISTIVE DEVICES UTILIZED TO PREVENT FALLS:  Life alert? No  Use of a cane, walker or w/c? No  Grab bars in the bathroom? Yes  Shower chair or bench in shower? No  Elevated toilet seat or a handicapped toilet? Yes   DME ORDERS:  DME order needed?  No   TIMED UP AND GO:  Was the test performed? No . Telephonic call Length of time to ambulate 10 feet:  sec.   Education: Fall risk prevention has been discussed.  Intervention(s) required? No   Cognitive Function:     6CIT Screen 09/14/2018  What Year? 0 points  What month? 0 points  What time? 0 points  Count back from 20 0 points  Months in reverse 0 points  Repeat phrase 0 points  Total Score 0    Screening Tests Health Maintenance  Topic Date Due  . INFLUENZA VACCINE  09/19/2018 (Originally 01/19/2018)  . COLONOSCOPY  07/15/2019 (Originally 06/21/2017)  . PNA vac Low Risk Adult (1 of 2 - PCV13) 07/15/2019 (Originally 03/25/2013)  . TETANUS/TDAP  10/18/2027  . Hepatitis C Screening  Completed    Qualifies for Shingles  Vaccine? Yes . Due for Shingrix. Education has been provided regarding the importance of this vaccine. Pt has been advised to call insurance company to determine out of pocket expense. Advised may also receive vaccine at local pharmacy or Health Dept. Verbalized acceptance and understanding.  Tdap: Up to date   Flu Vaccine: postponed to next flu season  Pneumococcal Vaccine: Due for Pneumococcal vaccine. Does the patient want to receive this vaccine today?  No . Education has been provided regarding the importance of this vaccine but still declined. Advised may receive this vaccine at local pharmacy or Health Dept. Aware to provide a copy of the vaccination record if obtained from local pharmacy or Health Dept. Verbalized acceptance and understanding.   Cancer Screenings:  Colorectal Screening: Completed 2009. Repeat every 10 years; Pt declines referral to GI at this time.   Lung Cancer Screening: (Low Dose CT Chest recommended if Age 59-80 years, 30 pack-year currently smoking OR have quit w/in 15years.) does not qualify.     Additional Screening:  Hepatitis C Screening: does qualify; Completed 09/05/15  Vision Screening: Recommended annual ophthalmology exams for early detection of glaucoma and other disorders of the eye. Is the patient up to date with their annual eye exam?  Yes  Who is the provider or what is the name of the office in which the pt attends annual eye exams? [facility] at [place]: Recommended annual dental exams for proper oral hygiene  Community Resource Referral:  CRR required this visit?  No       Plan:    I have personally reviewed and addressed the Medicare Annual Wellness questionnaire and have noted the following in the patient's chart:  A. Medical and social history  B. Use of alcohol, tobacco or illicit drugs  C. Current medications and supplements D. Functional ability and status E.  Nutritional status F.  Physical  activity G. Advance directives H. List of other physicians I.  Hospitalizations, surgeries, and ER visits in previous 12 months J.  [place] such as hearing and vision if needed, cognitive and depression L. Referrals and appointments   In addition, I have reviewed and discussed with patient certain preventive protocols, quality metrics, and best practice recommendations. A written personalized care plan for preventive services as well as general preventive health recommendations were provided to patient.   Signed,  Clemetine Marker, LPN Nurse Health Advisor   Nurse Notes: pt doing well and appreciative of telephonic visit today.

## 2018-09-15 ENCOUNTER — Ambulatory Visit: Payer: BLUE CROSS/BLUE SHIELD

## 2018-09-26 DIAGNOSIS — G4733 Obstructive sleep apnea (adult) (pediatric): Secondary | ICD-10-CM | POA: Diagnosis not present

## 2018-09-28 DIAGNOSIS — G4733 Obstructive sleep apnea (adult) (pediatric): Secondary | ICD-10-CM | POA: Diagnosis not present

## 2018-10-06 ENCOUNTER — Ambulatory Visit: Payer: BLUE CROSS/BLUE SHIELD | Admitting: Oncology

## 2018-10-06 ENCOUNTER — Other Ambulatory Visit: Payer: BLUE CROSS/BLUE SHIELD

## 2018-10-09 ENCOUNTER — Telehealth: Payer: Self-pay | Admitting: Internal Medicine

## 2018-10-09 NOTE — Telephone Encounter (Signed)
error 

## 2018-10-12 ENCOUNTER — Encounter: Payer: Self-pay | Admitting: Family Medicine

## 2018-10-12 ENCOUNTER — Encounter: Payer: Self-pay | Admitting: Nurse Practitioner

## 2018-10-26 DIAGNOSIS — G4733 Obstructive sleep apnea (adult) (pediatric): Secondary | ICD-10-CM | POA: Diagnosis not present

## 2018-12-21 ENCOUNTER — Encounter: Payer: Self-pay | Admitting: Nurse Practitioner

## 2018-12-21 ENCOUNTER — Ambulatory Visit (INDEPENDENT_AMBULATORY_CARE_PROVIDER_SITE_OTHER): Payer: 59 | Admitting: Nurse Practitioner

## 2018-12-21 VITALS — BP 113/78 | HR 86 | Resp 16

## 2018-12-21 DIAGNOSIS — I712 Thoracic aortic aneurysm, without rupture, unspecified: Secondary | ICD-10-CM

## 2018-12-21 DIAGNOSIS — I10 Essential (primary) hypertension: Secondary | ICD-10-CM | POA: Diagnosis not present

## 2018-12-21 DIAGNOSIS — D582 Other hemoglobinopathies: Secondary | ICD-10-CM

## 2018-12-21 DIAGNOSIS — E782 Mixed hyperlipidemia: Secondary | ICD-10-CM | POA: Diagnosis not present

## 2018-12-21 DIAGNOSIS — N529 Male erectile dysfunction, unspecified: Secondary | ICD-10-CM

## 2018-12-21 DIAGNOSIS — F1020 Alcohol dependence, uncomplicated: Secondary | ICD-10-CM

## 2018-12-21 DIAGNOSIS — G4733 Obstructive sleep apnea (adult) (pediatric): Secondary | ICD-10-CM

## 2018-12-21 MED ORDER — BENAZEPRIL HCL 20 MG PO TABS: 20.0000 mg | ORAL_TABLET | Freq: Every day | ORAL | 1 refills | Status: DC

## 2018-12-21 MED ORDER — ATORVASTATIN CALCIUM 10 MG PO TABS: 10.0000 mg | ORAL_TABLET | ORAL | 1 refills | Status: DC

## 2018-12-21 MED ORDER — TADALAFIL 20 MG PO TABS: ORAL_TABLET | ORAL | 11 refills | Status: DC

## 2018-12-21 NOTE — Progress Notes (Signed)
Virtual Visit via Video Note  I connected with James Medina on 12/21/18 at  8:40 AM EDT by a video enabled telemedicine application and verified that I am speaking with the correct person using two identifiers.   Staff discussed the limitations of evaluation and management by telemedicine and the availability of in person appointments. The patient expressed understanding and agreed to proceed.  Patient location: home  My location: home office Other people present:  none HPI  Hypertension Patient is on benazepril 20mg  daily .  Takes medications as prescribed with no missed doses a month.  He is compliant with low-salt diet.  Denies chest pain, headaches, blurry vision, dizziness.  Has OSA- self reported CPAP compliance: 100% Alcohol consumption: a couple of beers every evening Has Erectile dysfunction- takes cialis PRN BP Readings from Last 3 Encounters:  12/21/18 113/78  09/14/18 132/72  08/30/18 130/72    Hyperlipidemia Patient rx atorvastatin 10mg  nightly, takes medications as prescribed with no missed doses a month.  Diet: hes has been vegan for a little over a year, does not eat Fried foods Denies myalgias Lab Results  Component Value Date   CHOL 167 07/14/2018   HDL 62 07/14/2018   LDLCALC 89 07/14/2018   TRIG 69 07/14/2018   CHOLHDL 2.7 07/14/2018    PHQ2/9: Depression screen [facility] 2/9 12/21/2018 09/14/2018 07/14/2018 04/05/2017 03/25/2017  Decreased Interest 0 0 0 0 0  Down, Depressed, Hopeless 0 0 0 0 0  PHQ - 2 Score 0 0 0 0 0  Altered sleeping 0 - 0 - -  Tired, decreased energy 0 - 0 - -  Change in appetite 0 - 0 - -  Feeling bad or failure about yourself  0 - 0 - -  Trouble concentrating 0 - 0 - -  Moving slowly or fidgety/restless 0 - 0 - -  Suicidal thoughts 0 - 0 - -  PHQ-9 Score 0 - 0 - -  Difficult doing work/chores - - Not difficult at all - -    PHQ reviewed. Negative  Patient Active Problem List   Diagnosis Date Noted  . Obstructive sleep apnea  07/14/2018  . Pulmonary nodule 07/14/2018  . Osteoarthritis of knee 04/04/2018  . Thoracic aortic aneurysm without rupture ([place]) 01/25/2018  . Marijuana use 01/25/2018  . Alcoholism ([place]) 10/18/2017  . Osteoarthritis of both hands 10/18/2017  . Gout 03/25/2017  . Elevated hemoglobin ([place]) 07/13/2016  . Umbilical hernia without obstruction or gangrene 04/26/2016  . Impaired fasting glucose 09/16/2015  . Macrocytosis without anemia 09/16/2015  . Hematospermia 09/05/2015  . Abnormal CBC 06/08/2015  . Hyperlipidemia   . Hypertension   . ED (erectile dysfunction)   . Impotence     Past Medical History:  Diagnosis Date  . ED (erectile dysfunction)   . Hyperlipidemia   . Hypertension   . Impotence   . OA (osteoarthritis)    hand  . Obstructive sleep apnea 07/14/2018   Managed by pulmonologist  . Sleep apnea     Past Surgical History:  Procedure Laterality Date  . APPENDECTOMY    . broken ankle      Social History   Tobacco Use  . Smoking status: Former Smoker    Types: Cigars    Quit date: 04/06/2012    Years since quitting: 6.7  . Smokeless tobacco: Current User    Types: Chew  . Tobacco comment: small amount per pt  Substance Use Topics  . Alcohol use: Not Currently  Comment: occasionally wine onhce 1 month      Current Outpatient Medications:  .  aspirin EC 81 MG tablet, Take 1 tablet (81 mg total) by mouth daily. Take at least 1 hour before meloxicam, Disp: , Rfl:  .  atorvastatin (LIPITOR) 10 MG tablet, Take 1 tablet (10 mg total) by mouth every other day. Do NOT take for 3 days if you take colchicine, Disp: 45 tablet, Rfl: 1 .  benazepril (LOTENSIN) 20 MG tablet, Take 1 tablet (20 mg total) by mouth daily., Disp: 90 tablet, Rfl: 1 .  meloxicam (MOBIC) 15 MG tablet, Take 1 tablet (15 mg total) by mouth daily as needed., Disp: 30 tablet, Rfl: 0 .  methocarbamol (ROBAXIN) 500 MG tablet, Take 500 mg by mouth every 6 (six) hours as needed for muscle spasms.,  Disp: , Rfl:  .  tadalafil (CIALIS) 20 MG tablet, One by mouth every other day PRN; do not take along with the 5 mg (one or the other), Disp: 3 tablet, Rfl: 11  No Known Allergies  Review of Systems  Constitutional: Negative for chills, fever and malaise/fatigue.  HENT: Negative for congestion, sinus pain and sore throat.   Eyes: Negative for blurred vision.  Respiratory: Negative for cough and shortness of breath.   Cardiovascular: Negative for chest pain, palpitations and leg swelling.  Gastrointestinal: Negative for abdominal pain, constipation, diarrhea and nausea.  Genitourinary: Negative for dysuria.  Musculoskeletal: Negative for falls and joint pain.  Skin: Negative for rash.  Neurological: Negative for dizziness and headaches.  Endo/Heme/Allergies: Negative for polydipsia.  Psychiatric/Behavioral: The patient is not nervous/anxious and does not have insomnia.      No other specific complaints in a complete review of systems (except as listed in HPI above).  Objective  Vitals:   12/21/18 0852  BP: 113/78  Pulse: 86  Resp: 16     There is no height or weight on file to calculate BMI.  Nursing Note and Vital Signs reviewed.  Physical Exam  Constitutional: Patient appears well-developed and well-nourished. No distress.  HENT: Head: Normocephalic and atraumatic. Cardiovascular: Normal rate Pulmonary/Chest: Effort normal  Neurological: alert and oriented, speech normal.  Skin: No rash noted. No erythema.  Psychiatric: Patient has a normal mood and affect. behavior is normal. Judgment and thought content normal.    Assessment & Plan  1. Mixed hyperlipidemia Recheck at follow-up  - atorvastatin (LIPITOR) 10 MG tablet; Take 1 tablet (10 mg total) by mouth every other day. Do NOT take for 3 days if you take colchicine  Dispense: 45 tablet; Refill: 1  2. Essential hypertension Well controlled  - benazepril (LOTENSIN) 20 MG tablet; Take 1 tablet (20 mg total) by  mouth daily.  Dispense: 90 tablet; Refill: 1  3. Thoracic aortic aneurysm without rupture ([place]) Follows up with Dr. Lucky Cowboy. Good BP and cholesterol control   4. Obstructive sleep apnea Wears cpap  5. Erectile dysfunction, unspecified erectile dysfunction type - tadalafil (CIALIS) 20 MG tablet; One by mouth every other day PRN; do not take along with the 5 mg (one or the other)  Dispense: 3 tablet; Refill: 11  6. Elevated hemoglobin (HCC) Uses CPAP, can routinely recheck   7. Alcoholism ([place]) Noted in chart- only drinking 2 beers a night now     Follow Up Instructions:    I discussed the assessment and treatment plan with the patient. The patient was provided an opportunity to ask questions and all were answered. The patient agreed with the  plan and demonstrated an understanding of the instructions.   The patient was advised to call back or seek an in-person evaluation if the symptoms worsen or if the condition fails to improve as anticipated.  I provided 22 minutes of non-face-to-face time during this encounter- 16 min video additional chart reviewe.   Fredderick Severance, NP
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
## 2019-03-02 ENCOUNTER — Ambulatory Visit (INDEPENDENT_AMBULATORY_CARE_PROVIDER_SITE_OTHER): Payer: BLUE CROSS/BLUE SHIELD | Admitting: Vascular Surgery

## 2019-03-22 ENCOUNTER — Other Ambulatory Visit: Payer: Self-pay | Admitting: Family Medicine

## 2019-03-22 DIAGNOSIS — M19041 Primary osteoarthritis, right hand: Secondary | ICD-10-CM

## 2019-03-22 NOTE — Telephone Encounter (Signed)
Requested medication (s) are due for refill today: yes  Requested medication (s) are on the active medication list: yes  Last refill:  07/17/2018  Future visit scheduled: no  Notes to clinic:  Review for refill   Requested Prescriptions  Pending Prescriptions Disp Refills   meloxicam (MOBIC) 15 MG tablet [Pharmacy Med Name: MELOXICAM 15 MG TAB] 30 tablet 0    Sig: TAKE 1 TABLET BY MOUTH DAILY AS NEEDED     Analgesics:  COX2 Inhibitors Passed - 03/22/2019  8:29 AM      Passed - HGB in normal range and within 360 days    Hemoglobin  Date Value Ref Range Status  03/31/2018 16.0 13.0 - 17.0 g/dL Final  09/05/2015 15.0 12.6 - 17.7 g/dL Final         Passed - Cr in normal range and within 360 days    Creat  Date Value Ref Range Status  07/14/2018 0.86 0.70 - 1.18 mg/dL Final    Comment:    For patients >25 years of age, the reference limit for Creatinine is approximately 13% higher for people identified as African-American. Renella Cunas - Patient is not pregnant      Passed - Valid encounter within last 12 months    Recent Outpatient Visits          3 months ago Essential hypertension   [place], NP   8 months ago Mixed hyperlipidemia   [facility] Lada, Satira Anis, MD   1 year ago Alcoholism [facility])   [facility] Lada, Satira Anis, MD   1 year ago Patient risk and functional assessment   [facility] Lada, Satira Anis, MD   1 year ago Alcoholism [facility])   [facility] Steele Sizer, MD

## 2019-08-03 ENCOUNTER — Other Ambulatory Visit: Payer: Self-pay

## 2019-08-03 ENCOUNTER — Ambulatory Visit (INDEPENDENT_AMBULATORY_CARE_PROVIDER_SITE_OTHER): Payer: 59 | Admitting: Family Medicine

## 2019-08-03 ENCOUNTER — Encounter: Payer: Self-pay | Admitting: Family Medicine

## 2019-08-03 VITALS — BP 136/62 | Temp 98.1°F | Ht 72.0 in | Wt 212.0 lb

## 2019-08-03 DIAGNOSIS — M19041 Primary osteoarthritis, right hand: Secondary | ICD-10-CM

## 2019-08-03 DIAGNOSIS — I712 Thoracic aortic aneurysm, without rupture, unspecified: Secondary | ICD-10-CM

## 2019-08-03 DIAGNOSIS — D582 Other hemoglobinopathies: Secondary | ICD-10-CM

## 2019-08-03 DIAGNOSIS — N529 Male erectile dysfunction, unspecified: Secondary | ICD-10-CM

## 2019-08-03 DIAGNOSIS — M19042 Primary osteoarthritis, left hand: Secondary | ICD-10-CM

## 2019-08-03 DIAGNOSIS — M62838 Other muscle spasm: Secondary | ICD-10-CM

## 2019-08-03 DIAGNOSIS — I10 Essential (primary) hypertension: Secondary | ICD-10-CM | POA: Diagnosis not present

## 2019-08-03 DIAGNOSIS — E782 Mixed hyperlipidemia: Secondary | ICD-10-CM

## 2019-08-03 DIAGNOSIS — G4733 Obstructive sleep apnea (adult) (pediatric): Secondary | ICD-10-CM

## 2019-08-03 DIAGNOSIS — Z5181 Encounter for therapeutic drug level monitoring: Secondary | ICD-10-CM

## 2019-08-03 DIAGNOSIS — R7303 Prediabetes: Secondary | ICD-10-CM | POA: Insufficient documentation

## 2019-08-03 DIAGNOSIS — F1020 Alcohol dependence, uncomplicated: Secondary | ICD-10-CM

## 2019-08-03 MED ORDER — MELOXICAM 15 MG PO TABS: 15.0000 mg | ORAL_TABLET | Freq: Every day | ORAL | 2 refills | Status: DC | PRN

## 2019-08-03 MED ORDER — METHOCARBAMOL 500 MG PO TABS: 500.0000 mg | ORAL_TABLET | Freq: Three times a day (TID) | ORAL | 3 refills | Status: DC | PRN

## 2019-08-03 MED ORDER — TADALAFIL 20 MG PO TABS: 20.0000 mg | ORAL_TABLET | Freq: Every day | ORAL | 11 refills | Status: DC | PRN

## 2019-08-03 MED ORDER — ATORVASTATIN CALCIUM 10 MG PO TABS: 10.0000 mg | ORAL_TABLET | ORAL | 3 refills | Status: DC

## 2019-08-03 MED ORDER — BENAZEPRIL HCL 20 MG PO TABS: 20.0000 mg | ORAL_TABLET | Freq: Every day | ORAL | 1 refills | Status: DC

## 2019-08-03 NOTE — Progress Notes (Signed)
Name: James Medina   MRN: [id number]    DOB: [birth year]   Date:08/03/2019       Progress Note  Subjective:    Chief Complaint  Chief Complaint  Patient presents with  . Hypertension  . Hyperlipidemia  . Medication Refill    I connected with  [name] on 08/03/19 at  2:00 PM EST by telephone and verified that I am speaking with the correct person using two identifiers.  I discussed the limitations, risks, security and privacy concerns of performing an evaluation and management service by telephone and the availability of in person appointments. Staff also discussed with the patient that there may be a patient responsible charge related to this service. Patient Location: work (at [employer]) Provider Location: [facility] Additional Individuals present: none  HPI  Hyperlipidemia: Current Medication Regimen: lipitor 10 mg every other day, more than that causes myalgias and muscle cramps Last Lipids: Lab Results  Component Value Date   CHOL 167 07/14/2018   HDL 62 07/14/2018   LDLCALC 89 07/14/2018   TRIG 69 07/14/2018   CHOLHDL 2.7 07/14/2018   - Denies: Chest pain, shortness of breath, myalgias. - Documented aortic atherosclerosis? Yes - Risk factors for atherosclerosis: hypercholesterolemia and hypertension   Hypertension:  Currently managed on benazepril 20 mg daily Pt reports good med compliance and denies any SE.  No lightheadedness, hypotension, syncope. Blood pressure today is well controlled. BP Readings from Last 3 Encounters:  08/03/19 136/62  12/21/18 113/78  09/14/18 132/72   Pt denies CP, SOB, exertional sx, LE edema, palpitation, Ha's, visual disturbances  ED- cialis works for him he only gets a few pills at a time due to cost - wants to see if the generic is affordable with a few more pills than what he regularly gets will send in month supply with 10 doses and refills.  He states it is effective, brand-name is more effective than the generic.   He denies any severe headaches, hypotension, near syncopal episodes, cold sweats or any concerning side effects when he takes Cialis.  He has never had a prolonged erection.  Patient has history of obstructive sleep apnea he states he has trouble sleeping with his CPAP machine because of how big it is and it pulls on his head he states he tries to wear it at least 4 to 5 hours a night but he does take it off so that he can go to sleep.  He has not feeling excessively sleepy throughout the daytime and I did offer to have him follow-up with the sleep specialist or with the supply company and he declined at this time  History of prediabetes we will recheck his blood sugar and A1c today No u unintentional weight loss, polydipsia, polyuria  Patient has chronic bilateral knee pain he did go to a specialist and had injections he would like a refill on Mobic which she uses as needed for joint pain when it flares up.  He is no longer taking colchicine or any other pain medications or NSAIDs He also asked for refill on Robaxin which she has for muscle spasms which he still continues to get sometimes in his back and legs    Patient Active Problem List   Diagnosis Date Noted  . Prediabetes 08/03/2019  . Obstructive sleep apnea 07/14/2018  . Pulmonary nodule 07/14/2018  . Osteoarthritis of knee 04/04/2018  . Thoracic aortic aneurysm without rupture ([place]) 01/25/2018  . Osteoarthritis of both hands 10/18/2017  .  Gout 03/25/2017  . Elevated hemoglobin ([place]) 07/13/2016  . Umbilical hernia without obstruction or gangrene 04/26/2016  . Impaired fasting glucose 09/16/2015  . Macrocytosis without anemia 09/16/2015  . Hematospermia 09/05/2015  . Abnormal CBC 06/08/2015  . Hyperlipidemia   . Hypertension   . ED (erectile dysfunction)   . Impotence     Current Outpatient Medications:  .  aspirin EC 81 MG tablet, Take 1 tablet (81 mg total) by mouth daily. Take at least 1 hour before meloxicam, Disp: , Rfl:   .  atorvastatin (LIPITOR) 10 MG tablet, Take 1 tablet (10 mg total) by mouth every other day. In the evening, Disp: 45 tablet, Rfl: 3 .  benazepril (LOTENSIN) 20 MG tablet, Take 1 tablet (20 mg total) by mouth daily., Disp: 90 tablet, Rfl: 1 .  meloxicam (MOBIC) 15 MG tablet, Take 1 tablet (15 mg total) by mouth daily as needed. For joint pain, Disp: 30 tablet, Rfl: 2 .  methocarbamol (ROBAXIN) 500 MG tablet, Take 1 tablet (500 mg total) by mouth every 8 (eight) hours as needed for muscle spasms., Disp: 90 tablet, Rfl: 3 .  tadalafil (CIALIS) 20 MG tablet, Take 1 tablet (20 mg total) by mouth daily as needed for erectile dysfunction. Take 30 min prior to sexual activity, Disp: 10 tablet, Rfl: 11 No Known Allergies  Past Surgical History:  Procedure Laterality Date  . APPENDECTOMY    . broken ankle     Family History  Problem Relation Age of Onset  . Cancer Mother        leukemia  . Heart disease Father   . Learning disabilities Maternal Uncle   . Diabetes Brother   . Stroke Brother   . Diabetes Brother   . Cancer Maternal Grandmother   . Emphysema Maternal Grandfather   . Heart disease Sister        pacemaker  . COPD Neg Hx   . Hypertension Neg Hx    Social History   Socioeconomic History  . Marital status: Married    Spouse name: Not on file  . Number of children: 2  . Years of education: Not on file  . Highest education level: Some college, no degree  Occupational History  . Not on file  Tobacco Use  . Smoking status: Former Smoker    Types: Cigars    Quit date: 04/06/2012    Years since quitting: 7.3  . Smokeless tobacco: Current User    Types: Chew  . Tobacco comment: small amount per pt  Substance and Sexual Activity  . Alcohol use: Not Currently    Comment: occasionally wine onhce 1 month   . Drug use: Not Currently  . Sexual activity: Yes  Other Topics Concern  . Not on file  Social History Narrative   Married, still works as Museum/gallery conservator.     Social Determinants of Health   Financial Resource Strain: Low Risk   . Difficulty of Paying Living Expenses: Not hard at all  Food Insecurity: No Food Insecurity  . Worried About Charity fundraiser in the Last Year: Never true  . Ran Out of Food in the Last Year: Never true  Transportation Needs: No Transportation Needs  . Lack of Transportation (Medical): No  . Lack of Transportation (Non-Medical): No  Physical Activity: Inactive  . Days of Exercise per Week: 0 days  . Minutes of Exercise per Session: 0 min  Stress:   . Feeling of Stress : Not on file  Social Connections: Somewhat Isolated  . Frequency of Communication with Friends and Family: More than three times a week  . Frequency of Social Gatherings with Friends and Family: Three times a week  . Attends Religious Services: Never  . Active Member of Clubs or Organizations: No  . Attends Archivist Meetings: Never  . Marital Status: Married  Human resources officer Violence: Not At Risk  . Fear of Current or Ex-Partner: No  . Emotionally Abused: No  . Physically Abused: No  . Sexually Abused: No    Chart Review Today: I personally reviewed active problem list, medication list, allergies, family history, social history, health maintenance, notes from last encounter, lab results, imaging with the patient/caregiver today.  Review of Systems 10 Systems reviewed and are negative for acute change except as noted in the HPI.    Objective:    Virtual encounter, vitals limited, only able to obtain the following: Today's Vitals   08/03/19 1319  BP: 136/62  Temp: 98.1 F (36.7 C)  TempSrc: Temporal  Weight: 212 lb (96.2 kg)  Height: 6' (1.829 m)   Body mass index is 28.75 kg/m. Nursing Note and Vital Signs reviewed.  Physical Exam Phonation clear, pt alert, answering questions appropriately PE limited by telephone encounter   PHQ2/9: Depression screen [facility] 2/9 08/03/2019 12/21/2018 09/14/2018 07/14/2018  04/05/2017  Decreased Interest 0 0 0 0 0  Down, Depressed, Hopeless 0 0 0 0 0  PHQ - 2 Score 0 0 0 0 0  Altered sleeping 0 0 - 0 -  Tired, decreased energy 0 0 - 0 -  Change in appetite 0 0 - 0 -  Feeling bad or failure about yourself  0 0 - 0 -  Trouble concentrating 0 0 - 0 -  Moving slowly or fidgety/restless 0 0 - 0 -  Suicidal thoughts 0 0 - 0 -  PHQ-9 Score 0 0 - 0 -  Difficult doing work/chores Not difficult at all - - Not difficult at all -   PHQ-2/9 Result is neg, reviewed  Fall Risk: Fall Risk  08/03/2019 12/21/2018 09/14/2018 07/14/2018 01/06/2018  Falls in the past year? 0 0 0 0 No  Number falls in past yr: 0 0 0 0 -  Injury with Fall? 0 0 0 0 -  Follow up - - Falls prevention discussed - -     Assessment and Plan:     ICD-10-CM   1. Essential hypertension  I10 benazepril (LOTENSIN) 20 MG tablet    COMPLETE METABOLIC PANEL WITH GFR   well controlled, stable  2. Mixed hyperlipidemia  E78.2 atorvastatin (LIPITOR) 10 MG tablet    COMPLETE METABOLIC PANEL WITH GFR    Lipid panel   tolerating statin only every other night dosing  3. Erectile dysfunction, unspecified erectile dysfunction type  N52.9 tadalafil (CIALIS) 20 MG tablet   med refill, no SE or concerns  4. Primary osteoarthritis of both hands  M19.041 meloxicam (MOBIC) 15 MG tablet   M19.042    mobic prn  5. Thoracic aortic aneurysm without rupture ([facility])  [id number] COMPLETE METABOLIC PANEL WITH GFR    Lipid panel   BP well controlled and compliant with statin  6. Prediabetes  R73.03 Hemoglobin A1c   recheck labs  7. Muscle spasm  M62.838 methocarbamol (ROBAXIN) 500 MG tablet    COMPLETE METABOLIC PANEL WITH GFR   refill on robaxin  8. Elevated hemoglobin ([facility])  D58.2 CBC with Differential/Platelet   recheck CBC  9. Alcoholism (  [place])  [id number] COMPLETE METABOLIC PANEL WITH GFR   2-3 beers a night   10. Obstructive sleep apnea  G47.33    having trouble with CPAP not sleeping well with it, doesn't want to f/up  with sleep specialist or try and get updated device - he will let us know  11. Medication monitoring encounter  Z51.81 CBC with Differential/Platelet    COMPLETE METABOLIC PANEL WITH GFR    Lipid panel    Hemoglobin A1c     I discussed the assessment and treatment plan with the patient. The patient was provided an opportunity to ask questions and all were answered. The patient agreed with the plan and demonstrated an understanding of the instructions.   The patient was advised to call back or seek an in-person evaluation if the symptoms worsen or if the condition fails to improve as anticipated.  I provided 15 minutes of non-face-to-face time during this encounter.  Delsa Grana, PA-C 08/03/19 2:46 PM

## 2019-09-18 ENCOUNTER — Ambulatory Visit: Payer: Self-pay

## 2019-09-20 ENCOUNTER — Other Ambulatory Visit: Payer: Self-pay

## 2019-09-20 ENCOUNTER — Telehealth: Payer: Self-pay

## 2019-09-20 ENCOUNTER — Ambulatory Visit (INDEPENDENT_AMBULATORY_CARE_PROVIDER_SITE_OTHER): Payer: 59

## 2019-09-20 VITALS — BP 142/82 | HR 86 | Temp 96.9°F | Resp 16 | Ht 72.0 in | Wt 214.7 lb

## 2019-09-20 DIAGNOSIS — Z0000 Encounter for general adult medical examination without abnormal findings: Secondary | ICD-10-CM | POA: Diagnosis not present

## 2019-09-20 DIAGNOSIS — M10042 Idiopathic gout, left hand: Secondary | ICD-10-CM

## 2019-09-20 NOTE — Telephone Encounter (Signed)
Pt seen in office today for AWV. Pt requests new rx to be sent to [facility] to have on file for colcrys in case of future gout attack. Rx originally written by Dr. Sanda Klein.   Please advise patient if this is denied. Thank you.

## 2019-09-20 NOTE — Patient Instructions (Signed)
James Medina , Thank you for taking time to come for your Medicare Wellness Visit. I appreciate your ongoing commitment to your health goals. Please review the following plan we discussed and let me know if I can assist you in the future.   Screening recommendations/referrals: Colonoscopy: due Recommended yearly ophthalmology/optometry visit for glaucoma screening and checkup Recommended yearly dental visit for hygiene and checkup  Vaccinations: Influenza vaccine: done 03/22/19 Pneumococcal vaccine: due Tdap vaccine: done 10/17/17 Shingles vaccine: completed     Advanced directives: Please bring a copy of your health care power of attorney and living will to the office at your convenience.  Conditions/risks identified: Keep up the great work!  Next appointment: Please follow up in one year for your Medicare Annual Wellness visit.    Preventive Care 17 Years and Older, Male Preventive care refers to lifestyle choices and visits with your health care provider that can promote health and wellness. What does preventive care include?  A yearly physical exam. This is also called an annual well check.  Dental exams once or twice a year.  Routine eye exams. Ask your health care provider how often you should have your eyes checked.  Personal lifestyle choices, including:  Daily care of your teeth and gums.  Regular physical activity.  Eating a healthy diet.  Avoiding tobacco and drug use.  Limiting alcohol use.  Practicing safe sex.  Taking low doses of aspirin every day.  Taking vitamin and mineral supplements as recommended by your health care provider. What happens during an annual well check? The services and screenings done by your health care provider during your annual well check will depend on your age, overall health, lifestyle risk factors, and family history of disease. Counseling  Your health care provider may ask you questions about your:  Alcohol use.  Tobacco  use.  Drug use.  Emotional well-being.  Home and relationship well-being.  Sexual activity.  Eating habits.  History of falls.  Memory and ability to understand (cognition).  Work and work Statistician. Screening  You may have the following tests or measurements:  Height, weight, and BMI.  Blood pressure.  Lipid and cholesterol levels. These may be checked every 5 years, or more frequently if you are over 68 years old.  Skin check.  Lung cancer screening. You may have this screening every year starting at age 81 if you have a 30-pack-year history of smoking and currently smoke or have quit within the past 15 years.  Fecal occult blood test (FOBT) of the stool. You may have this test every year starting at age 25.  Flexible sigmoidoscopy or colonoscopy. You may have a sigmoidoscopy every 5 years or a colonoscopy every 10 years starting at age 62.  Prostate cancer screening. Recommendations will vary depending on your family history and other risks.  Hepatitis C blood test.  Hepatitis B blood test.  Sexually transmitted disease (STD) testing.  Diabetes screening. This is done by checking your blood sugar (glucose) after you have not eaten for a while (fasting). You may have this done every 1-3 years.  Abdominal aortic aneurysm (AAA) screening. You may need this if you are a current or former smoker.  Osteoporosis. You may be screened starting at age 62 if you are at high risk. Talk with your health care provider about your test results, treatment options, and if necessary, the need for more tests. Vaccines  Your health care provider may recommend certain vaccines, such as:  Influenza vaccine. This is  recommended every year.  Tetanus, diphtheria, and acellular pertussis (Tdap, Td) vaccine. You may need a Td booster every 10 years.  Zoster vaccine. You may need this after age 38.  Pneumococcal 13-valent conjugate (PCV13) vaccine. One dose is recommended after age  62.  Pneumococcal polysaccharide (PPSV23) vaccine. One dose is recommended after age 50. Talk to your health care provider about which screenings and vaccines you need and how often you need them. This information is not intended to replace advice given to you by your health care provider. Make sure you discuss any questions you have with your health care provider. Document Released: 07/04/2015 Document Revised: 02/25/2016 Document Reviewed: 04/08/2015 Elsevier Interactive Patient Education  2017 [place] Prevention in the Home Falls can cause injuries. They can happen to people of all ages. There are many things you can do to make your home safe and to help prevent falls. What can I do on the outside of my home?  Regularly fix the edges of walkways and driveways and fix any cracks.  Remove anything that might make you trip as you walk through a door, such as a raised step or threshold.  Trim any bushes or trees on the path to your home.  Use bright outdoor lighting.  Clear any walking paths of anything that might make someone trip, such as rocks or tools.  Regularly check to see if handrails are loose or broken. Make sure that both sides of any steps have handrails.  Any raised decks and porches should have guardrails on the edges.  Have any leaves, snow, or ice cleared regularly.  Use sand or salt on walking paths during winter.  Clean up any spills in your garage right away. This includes oil or grease spills. What can I do in the bathroom?  Use night lights.  Install grab bars by the toilet and in the tub and shower. Do not use towel bars as grab bars.  Use non-skid mats or decals in the tub or shower.  If you need to sit down in the shower, use a plastic, non-slip stool.  Keep the floor dry. Clean up any water that spills on the floor as soon as it happens.  Remove soap buildup in the tub or shower regularly.  Attach bath mats securely with double-sided  non-slip rug tape.  Do not have throw rugs and other things on the floor that can make you trip. What can I do in the bedroom?  Use night lights.  Make sure that you have a light by your bed that is easy to reach.  Do not use any sheets or blankets that are too big for your bed. They should not hang down onto the floor.  Have a firm chair that has side arms. You can use this for support while you get dressed.  Do not have throw rugs and other things on the floor that can make you trip. What can I do in the kitchen?  Clean up any spills right away.  Avoid walking on wet floors.  Keep items that you use a lot in easy-to-reach places.  If you need to reach something above you, use a strong step stool that has a grab bar.  Keep electrical cords out of the way.  Do not use floor polish or wax that makes floors slippery. If you must use wax, use non-skid floor wax.  Do not have throw rugs and other things on the floor that can make you trip.  What can I do with my stairs?  Do not leave any items on the stairs.  Make sure that there are handrails on both sides of the stairs and use them. Fix handrails that are broken or loose. Make sure that handrails are as long as the stairways.  Check any carpeting to make sure that it is firmly attached to the stairs. Fix any carpet that is loose or worn.  Avoid having throw rugs at the top or bottom of the stairs. If you do have throw rugs, attach them to the floor with carpet tape.  Make sure that you have a light switch at the top of the stairs and the bottom of the stairs. If you do not have them, ask someone to add them for you. What else can I do to help prevent falls?  Wear shoes that:  Do not have high heels.  Have rubber bottoms.  Are comfortable and fit you well.  Are closed at the toe. Do not wear sandals.  If you use a stepladder:  Make sure that it is fully opened. Do not climb a closed stepladder.  Make sure that both  sides of the stepladder are locked into place.  Ask someone to hold it for you, if possible.  Clearly mark and make sure that you can see:  Any grab bars or handrails.  First and last steps.  Where the edge of each step is.  Use tools that help you move around (mobility aids) if they are needed. These include:  Canes.  Walkers.  Scooters.  Crutches.  Turn on the lights when you go into a dark area. Replace any light bulbs as soon as they burn out.  Set up your furniture so you have a clear path. Avoid moving your furniture around.  If any of your floors are uneven, fix them.  If there are any pets around you, be aware of where they are.  Review your medicines with your doctor. Some medicines can make you feel dizzy. This can increase your chance of falling. Ask your doctor what other things that you can do to help prevent falls. This information is not intended to replace advice given to you by your health care provider. Make sure you discuss any questions you have with your health care provider. Document Released: 04/03/2009 Document Revised: 11/13/2015 Document Reviewed: 07/12/2014 Elsevier Interactive Patient Education  2017 [employer].

## 2019-09-20 NOTE — Progress Notes (Addendum)
Subjective:   James Medina is a 74 y.o. male who presents for Medicare Annual/Subsequent preventive examination.   Review of Systems:   Cardiac Risk Factors include: advanced age (>30men, >30 women);dyslipidemia;hypertension;male gender     Objective:    Vitals: BP (!) 142/82 (BP Location: Left Arm, Patient Position: Sitting, Cuff Size: Normal)   Pulse 86   Temp (!) 96.9 F (36.1 C) (Temporal)   Resp 16   Ht 6' (1.829 m)   Wt 214 lb 11.2 oz (97.4 kg)   SpO2 96%   BMI 29.12 kg/m   Body mass index is 29.12 kg/m.  Advanced Directives 09/20/2019 09/14/2018 03/31/2018 01/26/2018 01/12/2018 10/17/2017 04/05/2017  Does Patient Have a Medical Advance Directive? Yes No No No No No No  Type of Paramedic of [place];Living will - - - - - -  Copy of [place] in Chart? No - copy requested - - - - - -  Would patient like information on creating a medical advance directive? - No - Patient declined - - - - -    Tobacco Social History   Tobacco Use  Smoking Status Former Smoker  . Types: Cigars  . Quit date: 04/06/2012  . Years since quitting: 7.4  Smokeless Tobacco Current User  . Types: Chew  Tobacco Comment   small amount per pt     Ready to quit: Not Answered Counseling given: Not Answered Comment: small amount per pt   Clinical Intake:  Pre-visit preparation completed: Yes  Pain : 0-10 Pain Score: 3  Pain Type: Chronic pain Pain Location: Knee Pain Orientation: Right, Left Pain Descriptors / Indicators: Aching, Sore Pain Onset: More than a month ago Pain Frequency: Constant     BMI - recorded: 29.12 Nutritional Status: BMI 25 -29 Overweight Nutritional Risks: None Diabetes: No  How often do you need to have someone help you when you read instructions, pamphlets, or other written materials from your doctor or pharmacy?: 1 - Never  Interpreter Needed?: No  Information entered by :: Clemetine Marker LPN  Past Medical  History:  Diagnosis Date  . Alcoholism ([place]) 10/18/2017  . ED (erectile dysfunction)   . Hyperlipidemia   . Hypertension   . Impotence   . Marijuana use 01/25/2018  . OA (osteoarthritis)    hand  . Obstructive sleep apnea 07/14/2018   Managed by pulmonologist  . Sleep apnea    Past Surgical History:  Procedure Laterality Date  . APPENDECTOMY    . broken ankle     Family History  Problem Relation Age of Onset  . Cancer Mother        leukemia  . Heart disease Father   . Learning disabilities Maternal Uncle   . Diabetes Brother   . Stroke Brother   . Diabetes Brother   . Cancer Maternal Grandmother   . Emphysema Maternal Grandfather   . Heart disease Sister        pacemaker  . COPD Neg Hx   . Hypertension Neg Hx    Social History   Socioeconomic History  . Marital status: Married    Spouse name: Not on file  . Number of children: 2  . Years of education: Not on file  . Highest education level: Some college, no degree  Occupational History  . Not on file  Tobacco Use  . Smoking status: Former Smoker    Types: Cigars    Quit date: 04/06/2012    Years  since quitting: 7.4  . Smokeless tobacco: Current User    Types: Chew  . Tobacco comment: small amount per pt  Substance and Sexual Activity  . Alcohol use: Yes    Alcohol/week: 1.0 - 2.0 standard drinks    Types: 1 - 2 Cans of beer per week    Comment: occasionally wine onhce 1 month   . Drug use: Not Currently  . Sexual activity: Yes  Other Topics Concern  . Not on file  Social History Narrative   Married, still works as Museum/gallery conservator.    Social Determinants of Health   Financial Resource Strain: Low Risk   . Difficulty of Paying Living Expenses: Not hard at all  Food Insecurity: Unknown  . Worried About Charity fundraiser in the Last Year: Not on file  . Ran Out of Food in the Last Year: Never true  Transportation Needs: No Transportation Needs  . Lack of Transportation (Medical): No  . Lack of  Transportation (Non-Medical): No  Physical Activity: Inactive  . Days of Exercise per Week: 0 days  . Minutes of Exercise per Session: 0 min  Stress: No Stress Concern Present  . Feeling of Stress : Not at all  Social Connections: Somewhat Isolated  . Frequency of Communication with Friends and Family: More than three times a week  . Frequency of Social Gatherings with Friends and Family: Three times a week  . Attends Religious Services: Never  . Active Member of Clubs or Organizations: No  . Attends Archivist Meetings: Never  . Marital Status: Married    Outpatient Encounter Medications as of 09/20/2019  Medication Sig  . atorvastatin (LIPITOR) 10 MG tablet Take 1 tablet (10 mg total) by mouth every other day. In the evening  . benazepril (LOTENSIN) 20 MG tablet Take 1 tablet (20 mg total) by mouth daily.  . meloxicam (MOBIC) 15 MG tablet Take 1 tablet (15 mg total) by mouth daily as needed. For joint pain  . methocarbamol (ROBAXIN) 500 MG tablet Take 1 tablet (500 mg total) by mouth every 8 (eight) hours as needed for muscle spasms.  . tadalafil (CIALIS) 20 MG tablet Take 1 tablet (20 mg total) by mouth daily as needed for erectile dysfunction. Take 30 min prior to sexual activity  . aspirin EC 81 MG tablet Take 1 tablet (81 mg total) by mouth daily. Take at least 1 hour before meloxicam   No facility-administered encounter medications on file as of 09/20/2019.    Activities of Daily Living In your present state of health, do you have any difficulty performing the following activities: 09/20/2019 08/03/2019  Hearing? N N  Comment declines hearing aids -  Vision? N N  Difficulty concentrating or making decisions? N N  Walking or climbing stairs? N N  Dressing or bathing? N N  Doing errands, shopping? N N  Preparing Food and eating ? N -  Using the Toilet? N -  In the past six months, have you accidently leaked urine? N -  Do you have problems with loss of bowel control? N  -  Managing your Medications? N -  Managing your Finances? N -  Housekeeping or managing your Housekeeping? N -  Some recent data might be hidden    Patient Care Team: Delsa Grana, PA-C as PCP - General (Family Medicine)   Assessment:   This is a routine wellness examination for James Medina.  Exercise Activities and Dietary recommendations Current Exercise Habits: The patient has  a physically strenuous job, but has no regular exercise apart from work., Exercise limited by: None identified  Goals   None     Fall Risk Fall Risk  09/20/2019 08/03/2019 12/21/2018 09/14/2018 07/14/2018  Falls in the past year? 1 0 0 0 0  Number falls in past yr: 1 0 0 0 0  Comment knocked down by dog - - - -  Injury with Fall? 0 0 0 0 0  Risk for fall due to : No Fall Risks - - - -  Follow up Falls prevention discussed - - Falls prevention discussed -   FALL RISK PREVENTION PERTAINING TO THE HOME:  Any stairs in or around the home? Yes  If so, do they handrails? Yes   Home free of loose throw rugs in walkways, pet beds, electrical cords, etc? Yes  Adequate lighting in your home to reduce risk of falls? Yes   ASSISTIVE DEVICES UTILIZED TO PREVENT FALLS:  Life alert? No  Use of a cane, walker or w/c? No  Grab bars in the bathroom? No  Shower chair or bench in shower? No  Elevated toilet seat or a handicapped toilet? Yes   DME ORDERS:  DME order needed?  No   TIMED UP AND GO:  Was the test performed? Yes .  Length of time to ambulate 10 feet: 5 sec.   GAIT:  Appearance of gait: Gait stead-fast and without the use of an assistive device.   Education: Fall risk prevention has been discussed.  Intervention(s) required? No    Depression Screen PHQ 2/9 Scores 09/20/2019 08/03/2019 12/21/2018 09/14/2018  PHQ - 2 Score 0 0 0 0  PHQ- 9 Score - 0 0 -    Cognitive Function     6CIT Screen 09/20/2019 09/14/2018  What Year? 0 points 0 points  What month? 0 points 0 points  What time? 0 points 0  points  Count back from 20 0 points 0 points  Months in reverse 0 points 0 points  Repeat phrase 2 points 0 points  Total Score 2 0    Immunization History  Administered Date(s) Administered  . Fluad Quad(high Dose 65+) 03/22/2019  . Influenza, High Dose Seasonal PF 03/25/2017  . Tdap 06/21/2009, 10/17/2017  . Zoster Recombinat (Shingrix) 05/02/2019, 07/21/2019    Qualifies for Shingles Vaccine? Yes  . Due for Shingrix. Education has been provided regarding the importance of this vaccine. Pt has been advised to call insurance company to determine out of pocket expense. Advised may also receive vaccine at local pharmacy or Health Dept. Verbalized acceptance and understanding.  Tdap: Up to date  Flu Vaccine: Up to date  Pneumococcal Vaccine: Due for Pneumococcal vaccine. Does the patient want to receive this vaccine today?  No . Education has been provided regarding the importance of this vaccine but still declined. Advised may receive this vaccine at local pharmacy or Health Dept. Aware to provide a copy of the vaccination record if obtained from local pharmacy or Health Dept. Verbalized acceptance and understanding.   Screening Tests Health Maintenance  Topic Date Due  . PNA vac Low Risk Adult (1 of 2 - PCV13) Never done  . COLONOSCOPY  08/02/2020 (Originally 06/21/2017)  . INFLUENZA VACCINE  01/20/2020  . TETANUS/TDAP  10/18/2027  . Hepatitis C Screening  Completed   Cancer Screenings:  Colorectal Screening: Completed approx 2005.Marland Kitchen Repeat every 10 years; Pt states he would like to repeat screening colonoscopy at some point but does not want referral  at this time.   Lung Cancer Screening: (Low Dose CT Chest recommended if Age 76-80 years, 30 pack-year currently smoking OR have quit w/in 15years.) does not qualify.    Additional Screening:  Hepatitis C Screening: does qualify; Completed 09/05/15  Vision Screening: Recommended annual ophthalmology exams for early detection of  glaucoma and other disorders of the eye. Is the patient up to date with their annual eye exam?  Yes  Who is the provider or what is the name of the office in which the pt attends annual eye exams? [facility] Screening: Recommended annual dental exams for proper oral hygiene  Community Resource Referral:  CRR required this visit?  No       Plan:    I have personally reviewed and addressed the Medicare Annual Wellness questionnaire and have noted the following in the patient's chart:  A. Medical and social history B. Use of alcohol, tobacco or illicit drugs  C. Current medications and supplements D. Functional ability and status E.  Nutritional status F.  Physical activity G. Advance directives H. List of other physicians I.  Hospitalizations, surgeries, and ER visits in previous 12 months J.  [place] such as hearing and vision if needed, cognitive and depression L. Referrals and appointments   In addition, I have reviewed and discussed with patient certain preventive protocols, quality metrics, and best practice recommendations. A written personalized care plan for preventive services as well as general preventive health recommendations were provided to patient.   Signed,  Clemetine Marker, LPN Nurse Health Advisor   Nurse Notes: pt also completed previously ordered blood work today. Pt requests prescription for colcrys to be sent in to [facility] to keep on file in case of gout flare. It is not currently on medication list.

## 2019-09-21 LAB — COMPLETE METABOLIC PANEL WITH GFR
AG Ratio: 1.5 (calc) (ref 1.0–2.5)
ALT: 36 U/L (ref 9–46)
AST: 51 U/L — ABNORMAL HIGH (ref 10–35)
Albumin: 4 g/dL (ref 3.6–5.1)
Alkaline phosphatase (APISO): 77 U/L (ref 35–144)
BUN: 8 mg/dL (ref 7–25)
CO2: 26 mmol/L (ref 20–32)
Calcium: 9.2 mg/dL (ref 8.6–10.3)
Chloride: 104 mmol/L (ref 98–110)
Creat: 0.82 mg/dL (ref 0.70–1.18)
GFR, Est African American: 103 mL/min/{1.73_m2} (ref >=60)
GFR, Est Non African American: 89 mL/min/{1.73_m2} (ref >=60)
Globulin: 2.6 g/dL (calc) (ref 1.9–3.7)
Glucose, Bld: 113 mg/dL — ABNORMAL HIGH (ref 65–99)
Potassium: 4.3 mmol/L (ref 3.5–5.3)
Sodium: 140 mmol/L (ref 135–146)
Total Bilirubin: 0.8 mg/dL (ref 0.2–1.2)
Total Protein: 6.6 g/dL (ref 6.1–8.1)

## 2019-09-21 LAB — CBC WITH DIFFERENTIAL/PLATELET
Absolute Monocytes: 466 cells/uL (ref 200–950)
Basophils Absolute: 48 cells/uL (ref 0–200)
Basophils Relative: 0.9 %
Eosinophils Absolute: 101 cells/uL (ref 15–500)
Eosinophils Relative: 1.9 %
HCT: 50.9 % — ABNORMAL HIGH (ref 38.5–50.0)
Hemoglobin: 17.7 g/dL — ABNORMAL HIGH (ref 13.2–17.1)
Lymphs Abs: 1394 cells/uL (ref 850–3900)
MCH: 35.4 pg — ABNORMAL HIGH (ref 27.0–33.0)
MCHC: 34.8 g/dL (ref 32.0–36.0)
MCV: 101.8 fL — ABNORMAL HIGH (ref 80.0–100.0)
MPV: 9.1 fL (ref 7.5–12.5)
Monocytes Relative: 8.8 %
Neutro Abs: 3291 cells/uL (ref 1500–7800)
Neutrophils Relative %: 62.1 %
Platelets: 203 10*3/uL (ref 140–400)
RBC: 5 10*6/uL (ref 4.20–5.80)
RDW: 11.4 % (ref 11.0–15.0)
Total Lymphocyte: 26.3 %
WBC: 5.3 10*3/uL (ref 3.8–10.8)

## 2019-09-21 LAB — HEMOGLOBIN A1C
Hgb A1c MFr Bld: 5.5 % of total Hgb (ref <5.7)
Mean Plasma Glucose: 111 (calc)
eAG (mmol/L): 6.2 (calc)

## 2019-09-21 LAB — LIPID PANEL
Cholesterol: 171 mg/dL (ref <200)
HDL: 104 mg/dL (ref >=40)
LDL Cholesterol (Calc): 57 mg/dL (calc)
Non-HDL Cholesterol (Calc): 67 mg/dL (calc) (ref <130)
Total CHOL/HDL Ratio: 1.6 (calc) (ref <5.0)
Triglycerides: 36 mg/dL (ref <150)

## 2019-09-21 MED ORDER — COLCHICINE 0.6 MG PO TABS: 0.6000 mg | ORAL_TABLET | Freq: Every day | ORAL | 0 refills | Status: DC

## 2019-09-21 NOTE — Addendum Note (Signed)
Addended by: Docia Furl on: 09/21/2019 08:17 AM   Modules accepted: Orders

## 2019-09-26 ENCOUNTER — Telehealth: Payer: Self-pay

## 2019-09-26 DIAGNOSIS — R7989 Other specified abnormal findings of blood chemistry: Secondary | ICD-10-CM

## 2019-09-26 DIAGNOSIS — D7589 Other specified diseases of blood and blood-forming organs: Secondary | ICD-10-CM

## 2019-09-26 NOTE — Telephone Encounter (Signed)
Labs ordered.

## 2019-09-27 MED ORDER — COLCHICINE 0.6 MG PO TABS: ORAL_TABLET | ORAL | 5 refills | Status: DC

## 2019-09-27 NOTE — Telephone Encounter (Signed)
Last Rx cancelled in chart - CMA to f/up with pharmacy and pt  Sig: Take 1 tablet (0.6 mg total) by mouth daily.  Past pt colchicine Rx

## 2019-09-27 NOTE — Telephone Encounter (Signed)
Pharmacy notified.

## 2019-09-27 NOTE — Telephone Encounter (Signed)
Med Rx sent in with error -  Will need to fix/address  Refill should be from prior use in med list:  colchicine 0.6 MG tablet (Discontinued) 3 tablet 3 03/25/2017 12/21/2018   Sig: Take two pills at the onset of a gout attack; take one more pill one hour later   Patient not taking: Reported on 09/14/2018       Sent to pharmacy as: colchicine 0.6 MG tablet

## 2019-09-27 NOTE — Addendum Note (Signed)
Addended by: Delsa Grana on: 09/27/2019 12:41 AM   Modules accepted: Orders

## 2019-10-24 ENCOUNTER — Encounter: Payer: Self-pay | Admitting: Family Medicine

## 2019-10-24 ENCOUNTER — Ambulatory Visit (INDEPENDENT_AMBULATORY_CARE_PROVIDER_SITE_OTHER): Payer: 59 | Admitting: Family Medicine

## 2019-10-24 ENCOUNTER — Other Ambulatory Visit: Payer: Self-pay

## 2019-10-24 VITALS — BP 124/78 | HR 77 | Temp 97.9°F | Resp 14 | Ht 72.0 in | Wt 215.2 lb

## 2019-10-24 DIAGNOSIS — D582 Other hemoglobinopathies: Secondary | ICD-10-CM | POA: Diagnosis not present

## 2019-10-24 DIAGNOSIS — I10 Essential (primary) hypertension: Secondary | ICD-10-CM | POA: Diagnosis not present

## 2019-10-24 DIAGNOSIS — Z024 Encounter for examination for driving license: Secondary | ICD-10-CM

## 2019-10-24 DIAGNOSIS — E782 Mixed hyperlipidemia: Secondary | ICD-10-CM

## 2019-10-24 DIAGNOSIS — D7589 Other specified diseases of blood and blood-forming organs: Secondary | ICD-10-CM

## 2019-10-24 DIAGNOSIS — Z5181 Encounter for therapeutic drug level monitoring: Secondary | ICD-10-CM

## 2019-10-24 DIAGNOSIS — R7989 Other specified abnormal findings of blood chemistry: Secondary | ICD-10-CM

## 2019-10-24 DIAGNOSIS — F1020 Alcohol dependence, uncomplicated: Secondary | ICD-10-CM

## 2019-10-24 DIAGNOSIS — G4733 Obstructive sleep apnea (adult) (pediatric): Secondary | ICD-10-CM

## 2019-10-24 MED ORDER — BENAZEPRIL HCL 20 MG PO TABS: 20.0000 mg | ORAL_TABLET | Freq: Every day | ORAL | 3 refills | Status: DC
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
## 2019-10-25 LAB — COMPLETE METABOLIC PANEL WITH GFR
AG Ratio: 1.6 (calc) (ref 1.0–2.5)
ALT: 38 U/L (ref 9–46)
AST: 49 U/L — ABNORMAL HIGH (ref 10–35)
Albumin: 4.4 g/dL (ref 3.6–5.1)
Alkaline phosphatase (APISO): 81 U/L (ref 35–144)
BUN: 10 mg/dL (ref 7–25)
CO2: 25 mmol/L (ref 20–32)
Calcium: 9.1 mg/dL (ref 8.6–10.3)
Chloride: 104 mmol/L (ref 98–110)
Creat: 0.82 mg/dL (ref 0.70–1.18)
GFR, Est African American: 103 mL/min/{1.73_m2} (ref >=60)
GFR, Est Non African American: 89 mL/min/{1.73_m2} (ref >=60)
Globulin: 2.7 g/dL (calc) (ref 1.9–3.7)
Glucose, Bld: 130 mg/dL — ABNORMAL HIGH (ref 65–99)
Potassium: 3.8 mmol/L (ref 3.5–5.3)
Sodium: 140 mmol/L (ref 135–146)
Total Bilirubin: 1.4 mg/dL — ABNORMAL HIGH (ref 0.2–1.2)
Total Protein: 7.1 g/dL (ref 6.1–8.1)

## 2019-10-25 LAB — B12 AND FOLATE PANEL
Folate: >24 ng/mL
Vitamin B-12: >2000 pg/mL — ABNORMAL HIGH (ref 200–1100)

## 2019-10-25 LAB — CBC WITH DIFFERENTIAL/PLATELET
Absolute Monocytes: 523 cells/uL (ref 200–950)
Basophils Absolute: 50 cells/uL (ref 0–200)
Basophils Relative: 0.8 %
Eosinophils Absolute: 132 cells/uL (ref 15–500)
Eosinophils Relative: 2.1 %
HCT: 47.5 % (ref 38.5–50.0)
Hemoglobin: 16.3 g/dL (ref 13.2–17.1)
Lymphs Abs: 1380 cells/uL (ref 850–3900)
MCH: 34.6 pg — ABNORMAL HIGH (ref 27.0–33.0)
MCHC: 34.3 g/dL (ref 32.0–36.0)
MCV: 100.8 fL — ABNORMAL HIGH (ref 80.0–100.0)
MPV: 9 fL (ref 7.5–12.5)
Monocytes Relative: 8.3 %
Neutro Abs: 4215 cells/uL (ref 1500–7800)
Neutrophils Relative %: 66.9 %
Platelets: 219 10*3/uL (ref 140–400)
RBC: 4.71 10*6/uL (ref 4.20–5.80)
RDW: 11.7 % (ref 11.0–15.0)
Total Lymphocyte: 21.9 %
WBC: 6.3 10*3/uL (ref 3.8–10.8)

## 2019-10-31 ENCOUNTER — Telehealth: Payer: Self-pay

## 2019-10-31 DIAGNOSIS — R17 Unspecified jaundice: Secondary | ICD-10-CM

## 2019-10-31 DIAGNOSIS — F1020 Alcohol dependence, uncomplicated: Secondary | ICD-10-CM

## 2019-10-31 DIAGNOSIS — R7989 Other specified abnormal findings of blood chemistry: Secondary | ICD-10-CM

## 2019-10-31 NOTE — Telephone Encounter (Signed)
-----   Message from Delsa Grana, [place] sent at 10/30/2019  7:30 PM EDT ----- Please notify pt of labwork  Patient continues to have some elevated liver function test -we will order right upper quadrant ultrasound he also has mildly elevated bilirubin  He does not have high hemoglobin but his red blood cells are still large in size -his B12 is high he can decrease any supplements he may have her B12 but continue any folate or thiamine supplements and strongly encouraged him to decrease alcohol intake even further to 1-2 beers a day for less than 5 days a week  Please order right upper quadrant ultrasound for evaluation of dx elevated LFTs and elevated bili and history of alcoholism

## 2019-11-12 ENCOUNTER — Ambulatory Visit
Admission: RE | Admit: 2019-11-12 | Discharge: 2019-11-12 | Disposition: A | Discharge status: Home / Self Care | Payer: 59 | Source: Ambulatory Visit | Attending: Family Medicine | Admitting: Family Medicine

## 2019-11-12 ENCOUNTER — Other Ambulatory Visit: Payer: Self-pay

## 2019-11-12 DIAGNOSIS — F1020 Alcohol dependence, uncomplicated: Secondary | ICD-10-CM | POA: Insufficient documentation

## 2019-11-12 DIAGNOSIS — R7989 Other specified abnormal findings of blood chemistry: Secondary | ICD-10-CM | POA: Diagnosis present

## 2019-11-12 DIAGNOSIS — K7689 Other specified diseases of liver: Secondary | ICD-10-CM | POA: Diagnosis not present

## 2019-11-12 DIAGNOSIS — R17 Unspecified jaundice: Secondary | ICD-10-CM

## 2019-11-26 ENCOUNTER — Other Ambulatory Visit: Payer: Self-pay

## 2019-11-26 ENCOUNTER — Ambulatory Visit (INDEPENDENT_AMBULATORY_CARE_PROVIDER_SITE_OTHER): Payer: 59 | Admitting: Adult Health

## 2019-11-26 ENCOUNTER — Encounter: Payer: Self-pay | Admitting: Adult Health

## 2019-11-26 VITALS — BP 128/78 | HR 82 | Temp 98.0°F | Ht 72.0 in | Wt 223.6 lb

## 2019-11-26 DIAGNOSIS — G4733 Obstructive sleep apnea (adult) (pediatric): Secondary | ICD-10-CM

## 2019-11-26 NOTE — Patient Instructions (Addendum)
Change CPAP pressure to 8 to 15cmH2O .  Trial of Dream wear nasal mask.  Try to wear your CPAP every night for at least 4hr  Do not drive if sleepy .  Follow up in 3 months for CPAP download and DMV paperwork.

## 2019-11-26 NOTE — Assessment & Plan Note (Signed)
Severe obstructive sleep apnea.  Patient is encouraged on CPAP compliance.  Patient education on sleep apnea and nocturnal CPAP. Patient complains that his mask is very uncomfortable.  We will order a new mask suggested the DreamWear nasal mask which may be more comfortable for him.  Also will adjust pressure setting as he thinks the pressure is sometimes too low and sometimes too high will adjust to 8 to 15 cm H2O. Get CPAP download in 3 months on return visit.  At that time we will can complete DMV paperwork if able  Plan  Patient Instructions  Change CPAP pressure to 8 to 15cmH2O .  Trial of Dream wear nasal mask.  Try to wear your CPAP every night for at least 4hr  Do not drive if sleepy .  Follow up in 3 months for CPAP download and DMV paperwork.
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
## 2020-02-01 ENCOUNTER — Ambulatory Visit: Payer: Medicare Other | Admitting: Family Medicine

## 2020-03-13 ENCOUNTER — Encounter: Payer: Self-pay | Admitting: Adult Health

## 2020-03-13 ENCOUNTER — Other Ambulatory Visit: Payer: Self-pay

## 2020-03-13 ENCOUNTER — Ambulatory Visit (INDEPENDENT_AMBULATORY_CARE_PROVIDER_SITE_OTHER): Payer: 59 | Admitting: Adult Health

## 2020-03-13 VITALS — BP 120/64 | HR 85 | Temp 97.3°F | Ht 72.0 in | Wt 221.4 lb

## 2020-03-13 DIAGNOSIS — Z23 Encounter for immunization: Secondary | ICD-10-CM | POA: Diagnosis not present

## 2020-03-13 DIAGNOSIS — G4733 Obstructive sleep apnea (adult) (pediatric): Secondary | ICD-10-CM

## 2020-03-13 NOTE — Assessment & Plan Note (Signed)
Excellent control and compliance on nocturnal CPAP. Recent CPAP pressure changes have been successful. DMV paperwork completed as requested  Plan  Patient Instructions  Continue on CPAP at bedtime Keep up the good work Remain active work on healthy weight Do not drive if sleepy .  Flu shot today Follow-up with Dr. Mortimer Fries in 1 year and As needed

## 2020-03-13 NOTE — Progress Notes (Signed)
@Patient  ID: James Medina, male    DOB: 1945-08-26, 74 y.o.   MRN: [id number]  Chief Complaint  Patient presents with  . Follow-up    wearing cpap avg 4-7hr nightly- feels pressure and mask are okay. wakes feeling well rested. ZYS:AYTKZ.     Referring provider: Delsa Grana, Hershal Coria  HPI: 74 year old male followed for severe obstructive sleep apnea Medical history significant for hypertension  TEST/EVENTS :  Home sleep study March 14, 2018 showed severe obstructive sleep apnea with a AHI of 59/hour   03/13/2020 follow-up obstructive sleep apnea Patient returns for a follow-up visit for sleep apnea.  He has underlying severe obstructive sleep apnea is on nocturnal CPAP.  Last visit patient was having some difficulty with the pressures on his CPAP.  We adjusted his CPAP pressure for comfort to 8 to 15 cm H2O.  Patient says his CPAP is now much more comfortable is able to wear it all night long. CPAP download shows excellent compliance with 100% usage, daily average usage at 5.5 hours.  Patient is on auto CPAP 8 to 15 cm H2O.  AHI 1.9.  Minimum leaks. Patient feels rested with no significant daytime sleepiness.  Patient continues to work full-time.  Has no difficulty at work. Patient has DMV paperwork to be completed today.  CPAP compliance was documented and faxed to [facility] per request  No Known Allergies  Immunization History  Administered Date(s) Administered  . Fluad Quad(high Dose 65+) 03/22/2019  . Influenza, High Dose Seasonal PF 03/25/2017  . Moderna SARS-COVID-2 Vaccination 10/26/2019, 11/23/2019  . Tdap 06/21/2009, 10/17/2017  . Zoster Recombinat (Shingrix) 05/02/2019, 07/21/2019    Past Medical History:  Diagnosis Date  . Alcoholism ([place]) 10/18/2017  . ED (erectile dysfunction)   . Hyperlipidemia   . Hypertension   . Impotence   . Marijuana use 01/25/2018  . OA (osteoarthritis)    hand  . Obstructive sleep apnea 07/14/2018   Managed by pulmonologist  . Sleep  apnea     Tobacco History: Social History   Tobacco Use  Smoking Status Former Smoker  . Packs/day: 1.00  . Years: 15.00  . Pack years: 15.00  . Types: Cigars, Cigarettes  . Quit date: 04/06/2012  . Years since quitting: 7.9  Smokeless Tobacco Current User  . Types: Chew  Tobacco Comment   small amount per pt   Ready to quit: Not Answered Counseling given: Not Answered Comment: small amount per pt   Outpatient Medications Prior to Visit  Medication Sig Dispense Refill  . aspirin EC 81 MG tablet Take 1 tablet (81 mg total) by mouth daily. Take at least 1 hour before meloxicam    . atorvastatin (LIPITOR) 10 MG tablet Take 1 tablet (10 mg total) by mouth every other day. In the evening 45 tablet 3  . benazepril (LOTENSIN) 20 MG tablet Take 1 tablet (20 mg total) by mouth daily. 90 tablet 3  . colchicine 0.6 MG tablet Take two pills at the onset of a gout attack; take one more pill one hour later 3 tablet 5  . meloxicam (MOBIC) 15 MG tablet Take 1 tablet (15 mg total) by mouth daily as needed. For joint pain 30 tablet 2  . methocarbamol (ROBAXIN) 500 MG tablet Take 1 tablet (500 mg total) by mouth every 8 (eight) hours as needed for muscle spasms. 90 tablet 3  . tadalafil (CIALIS) 20 MG tablet Take 1 tablet (20 mg total) by mouth daily as needed for erectile dysfunction. Take  30 min prior to sexual activity 10 tablet 11   No facility-administered medications prior to visit.     Review of Systems:   Constitutional:   No  weight loss, night sweats,  Fevers, chills, fatigue, or  lassitude.  HEENT:   No headaches,  Difficulty swallowing,  Tooth/dental problems, or  Sore throat,                No sneezing, itching, ear ache, nasal congestion, post nasal drip,   CV:  No chest pain,  Orthopnea, PND, swelling in lower extremities, anasarca, dizziness, palpitations, syncope.   GI  No heartburn, indigestion, abdominal pain, nausea, vomiting, diarrhea, change in bowel habits, loss of  appetite, bloody stools.   Resp: No shortness of breath with exertion or at rest.  No excess mucus, no productive cough,  No non-productive cough,  No coughing up of blood.  No change in color of mucus.  No wheezing.  No chest wall deformity  Skin: no rash or lesions.  GU: no dysuria, change in color of urine, no urgency or frequency.  No flank pain, no hematuria   MS:  No joint pain or swelling.  No decreased range of motion.  No back pain.    Physical Exam  BP 120/64 (BP Location: Left Arm, Cuff Size: Normal)   Pulse 85   Temp (!) 97.3 F (36.3 C) (Temporal)   Ht 6' (1.829 m)   Wt 221 lb 6.4 oz (100.4 kg)   SpO2 97%   BMI 30.03 kg/m   GEN: A/Ox3; pleasant , NAD, well nourished    HEENT:  [place]/AT,   NOSE-clear, THROAT-clear, no lesions, no postnasal drip or exudate noted.   NECK:  Supple w/ fair ROM; no JVD; normal carotid impulses w/o bruits; no thyromegaly or nodules palpated; no lymphadenopathy.    RESP  Clear  P & A; w/o, wheezes/ rales/ or rhonchi. no accessory muscle use, no dullness to percussion  CARD:  RRR, no m/r/g, no peripheral edema, pulses intact, no cyanosis or clubbing.  GI:   Soft & nt; nml bowel sounds; no organomegaly or masses detected.   Musco: Warm bil, no deformities or joint swelling noted.   Neuro: alert, no focal deficits noted.    Skin: Warm, no lesions or rashes    Lab Results:  CBC  BMET  No results found for: BNP  ProBNP No results found for: PROBNP  Imaging: No results found.    No flowsheet data found.  No results found for: NITRICOXIDE      Assessment & Plan:   Obstructive sleep apnea Excellent control and compliance on nocturnal CPAP. Recent CPAP pressure changes have been successful. DMV paperwork completed as requested  Plan  Patient Instructions  Continue on CPAP at bedtime Keep up the good work Remain active work on healthy weight Do not drive if sleepy .  Flu shot today Follow-up with Dr. Mortimer Fries in 1  year and As needed           Rexene Edison, NP 03/13/2020

## 2020-03-13 NOTE — Patient Instructions (Signed)
Continue on CPAP at bedtime Keep up the good work Remain active work on healthy weight Do not drive if sleepy .  Flu shot today Follow-up with Dr. Mortimer Fries in 1 year and As needed

## 2020-04-25 ENCOUNTER — Ambulatory Visit: Payer: Medicare Other | Admitting: Family Medicine

## 2020-08-06 ENCOUNTER — Ambulatory Visit: Payer: Medicare Other | Admitting: Family Medicine

## 2020-08-06 ENCOUNTER — Encounter: Payer: Self-pay | Admitting: Family Medicine

## 2020-08-06 ENCOUNTER — Other Ambulatory Visit: Payer: Self-pay

## 2020-08-06 ENCOUNTER — Ambulatory Visit (INDEPENDENT_AMBULATORY_CARE_PROVIDER_SITE_OTHER): Payer: 59 | Admitting: Family Medicine

## 2020-08-06 VITALS — BP 122/76 | HR 100 | Temp 98.2°F | Resp 16 | Ht 72.0 in | Wt 220.6 lb

## 2020-08-06 DIAGNOSIS — N529 Male erectile dysfunction, unspecified: Secondary | ICD-10-CM

## 2020-08-06 DIAGNOSIS — I10 Essential (primary) hypertension: Secondary | ICD-10-CM

## 2020-08-06 DIAGNOSIS — E782 Mixed hyperlipidemia: Secondary | ICD-10-CM | POA: Diagnosis not present

## 2020-08-06 DIAGNOSIS — D7589 Other specified diseases of blood and blood-forming organs: Secondary | ICD-10-CM

## 2020-08-06 DIAGNOSIS — Z5181 Encounter for therapeutic drug level monitoring: Secondary | ICD-10-CM

## 2020-08-06 DIAGNOSIS — F1020 Alcohol dependence, uncomplicated: Secondary | ICD-10-CM

## 2020-08-06 DIAGNOSIS — Z1211 Encounter for screening for malignant neoplasm of colon: Secondary | ICD-10-CM

## 2020-08-06 DIAGNOSIS — D518 Other vitamin B12 deficiency anemias: Secondary | ICD-10-CM

## 2020-08-06 DIAGNOSIS — M10042 Idiopathic gout, left hand: Secondary | ICD-10-CM

## 2020-08-06 MED ORDER — TADALAFIL 20 MG PO TABS: 20.0000 mg | ORAL_TABLET | Freq: Every day | ORAL | 11 refills | Status: DC | PRN

## 2020-08-06 MED ORDER — COLCHICINE 0.6 MG PO TABS: ORAL_TABLET | ORAL | 5 refills | Status: DC

## 2020-08-06 MED ORDER — BENAZEPRIL HCL 20 MG PO TABS: 20.0000 mg | ORAL_TABLET | Freq: Every day | ORAL | 3 refills | Status: DC

## 2020-08-06 MED ORDER — ATORVASTATIN CALCIUM 10 MG PO TABS: 10.0000 mg | ORAL_TABLET | ORAL | 3 refills | Status: DC

## 2020-08-06 NOTE — Progress Notes (Signed)
Name: James Medina   MRN: [id number]    DOB: 03/06/1946   Date:08/06/2020       Progress Note  Chief Complaint  Patient presents with  . Follow-up  . Hypertension  . Hyperlipidemia  . Gout     Subjective:   James Medina is a 75 y.o. male, presents to clinic for  Hypertension:  Currently managed on benazepril 20 mg daily Pt reports good med compliance and denies any SE.   Blood pressure today is well controlled. BP Readings from Last 3 Encounters:  08/06/20 122/76  03/13/20 120/64  11/26/19 128/78   Pt denies CP, SOB, exertional sx, LE edema, palpitation, Ha's, visual disturbances, lightheadedness, hypotension, syncope. Dietary efforts for BP?     B12 - high, he decresed his supplement, would like to recheck ETOH daily 2-3 history of elevated H&H, mild macrocytosis in the past  OSA with CPAP tries to wear every night, only 4 hours a night, but overall not sleeping well with it.  He has to take the CPAP off to actually fall asleep  Hyperlipidemia: Currently treated with Lipitor 10 mg, pt reports good med compliance Last Lipids: Lab Results  Component Value Date   CHOL 171 09/20/2019   HDL 104 09/20/2019   LDLCALC 57 09/20/2019   TRIG 36 09/20/2019   CHOLHDL 1.6 09/20/2019   - Denies: Chest pain, shortness of breath, myalgias, claudication  Jones - ortho knee/ankle pain managing OA bilateral   He also has history of gout he just colchicine as needed in the past his uric acid was elevated but he has not on any uric acid lowering medications, he has had a few gouty flares over the past couple years, usually well managed with taking colchicine his symptoms have symptoms start    Current Outpatient Medications:  .  aspirin EC 81 MG tablet, Take 1 tablet (81 mg total) by mouth daily. Take at least 1 hour before meloxicam, Disp: , Rfl:  .  atorvastatin (LIPITOR) 10 MG tablet, Take 1 tablet (10 mg total) by mouth every other day. In the evening, Disp: 45 tablet,  Rfl: 3 .  benazepril (LOTENSIN) 20 MG tablet, Take 1 tablet (20 mg total) by mouth daily., Disp: 90 tablet, Rfl: 3 .  colchicine 0.6 MG tablet, Take two pills at the onset of a gout attack; take one more pill one hour later, Disp: 3 tablet, Rfl: 5 .  meloxicam (MOBIC) 15 MG tablet, Take 1 tablet (15 mg total) by mouth daily as needed. For joint pain, Disp: 30 tablet, Rfl: 2 .  methocarbamol (ROBAXIN) 500 MG tablet, Take 1 tablet (500 mg total) by mouth every 8 (eight) hours as needed for muscle spasms., Disp: 90 tablet, Rfl: 3 .  tadalafil (CIALIS) 20 MG tablet, Take 1 tablet (20 mg total) by mouth daily as needed for erectile dysfunction. Take 30 min prior to sexual activity, Disp: 10 tablet, Rfl: 11  Patient Active Problem List   Diagnosis Date Noted  . Elevated LFTs 10/24/2019  . Prediabetes 08/03/2019  . Obstructive sleep apnea 07/14/2018  . Pulmonary nodule 07/14/2018  . Osteoarthritis of knee 04/04/2018  . Thoracic aortic aneurysm without rupture ([place]) 01/25/2018  . Osteoarthritis of both hands 10/18/2017  . Gout 03/25/2017  . Elevated hemoglobin ([place]) 07/13/2016  . Umbilical hernia without obstruction or gangrene 04/26/2016  . Impaired fasting glucose 09/16/2015  . Macrocytosis without anemia 09/16/2015  . Hematospermia 09/05/2015  . Abnormal CBC 06/08/2015  . Hyperlipidemia   .  Hypertension   . ED (erectile dysfunction)   . Impotence     Past Surgical History:  Procedure Laterality Date  . APPENDECTOMY    . broken ankle      Family History  Problem Relation Age of Onset  . Cancer Mother        leukemia  . Heart disease Father   . Learning disabilities Maternal Uncle   . Diabetes Brother   . Stroke Brother   . Diabetes Brother   . Cancer Maternal Grandmother   . Emphysema Maternal Grandfather   . Heart disease Sister        pacemaker  . COPD Neg Hx   . Hypertension Neg Hx     Social History   Tobacco Use  . Smoking status: Former Smoker    Packs/day:  1.00    Years: 15.00    Pack years: 15.00    Types: Cigars, Cigarettes    Quit date: 04/06/2012    Years since quitting: 8.3  . Smokeless tobacco: Current User    Types: Chew  . Tobacco comment: small amount per pt  Vaping Use  . Vaping Use: Never used  Substance Use Topics  . Alcohol use: Yes    Alcohol/week: 1.0 - 2.0 standard drink    Types: 1 - 2 Cans of beer per week    Comment: occasionally wine onhce 1 month   . Drug use: Not Currently     No Known Allergies  Health Maintenance  Topic Date Due  . COLONOSCOPY (Pts 45-30yrs Insurance coverage will need to be confirmed)  06/21/2017  . PNA vac Low Risk Adult (1 of 2 - PCV13) 10/23/2020 (Originally 03/25/2013)  . COVID-19 Vaccine (4 - Booster for Moderna series) 11/25/2020  . TETANUS/TDAP  10/18/2027  . INFLUENZA VACCINE  Completed  . Hepatitis C Screening  Completed    Chart Review Today: I personally reviewed active problem list, medication list, allergies, family history, social history, health maintenance, notes from last encounter, lab results, imaging with the patient/caregiver today.   Review of Systems  Constitutional: Negative.   HENT: Negative.   Eyes: Negative.   Respiratory: Negative.   Cardiovascular: Negative.   Gastrointestinal: Negative.   Endocrine: Negative.   Genitourinary: Negative.   Musculoskeletal: Negative.   Skin: Negative.   Allergic/Immunologic: Negative.   Neurological: Negative.   Hematological: Negative.   Psychiatric/Behavioral: Negative.   All other systems reviewed and are negative.    Objective:   Vitals:   08/06/20 0946  BP: 122/76  Pulse: 100  Resp: 16  Temp: 98.2 F (36.8 C)  SpO2: 97%  Weight: 220 lb 9.6 oz (100.1 kg)  Height: 6' (1.829 m)    Body mass index is 29.92 kg/m.  Physical Exam Vitals and nursing note reviewed.  Constitutional:      General: He is not in acute distress.    Appearance: Normal appearance. He is well-developed. He is not  ill-appearing, toxic-appearing or diaphoretic.     Interventions: Face mask in place.  HENT:     Head: Normocephalic and atraumatic.     Jaw: No trismus.     Right Ear: External ear normal.     Left Ear: External ear normal.  Eyes:     General: Lids are normal. No scleral icterus.       Right eye: No discharge.        Left eye: No discharge.     Conjunctiva/sclera: Conjunctivae normal.  Neck:  Trachea: Trachea and phonation normal. No tracheal deviation.  Cardiovascular:     Rate and Rhythm: Normal rate and regular rhythm.     Pulses: Normal pulses.          Radial pulses are 2+ on the right side and 2+ on the left side.       Posterior tibial pulses are 2+ on the right side and 2+ on the left side.     Heart sounds: Normal heart sounds. No murmur heard. No friction rub. No gallop.   Pulmonary:     Effort: Pulmonary effort is normal. No respiratory distress.     Breath sounds: Normal breath sounds. No stridor. No wheezing, rhonchi or rales.  Abdominal:     General: Bowel sounds are normal. There is no distension.     Palpations: Abdomen is soft.  Musculoskeletal:     Right lower leg: No edema.     Left lower leg: No edema.  Skin:    General: Skin is warm and dry.     Coloration: Skin is not jaundiced or pale.     Findings: No rash.     Nails: There is no clubbing.  Neurological:     Mental Status: He is alert. Mental status is at baseline.     Cranial Nerves: No dysarthria or facial asymmetry.     Motor: No tremor or abnormal muscle tone.     Gait: Gait normal.  Psychiatric:        Mood and Affect: Mood normal.        Speech: Speech normal.        Behavior: Behavior normal. Behavior is cooperative.         Assessment & Plan:     ICD-10-CM   1. Mixed hyperlipidemia  G25.4 COMPLETE METABOLIC PANEL WITH GFR    Lipid panel    atorvastatin (LIPITOR) 10 MG tablet   See below -patient continues to decrease the dose and frequency but is still attempting to take it   2. Essential hypertension  Y70 COMPLETE METABOLIC PANEL WITH GFR    benazepril (LOTENSIN) 20 MG tablet   See below  3. Medication monitoring encounter  W23.76 COMPLETE METABOLIC PANEL WITH GFR    Lipid panel  4. Acute idiopathic gout of left hand  E83.151 COMPLETE METABOLIC PANEL WITH GFR    Uric acid    colchicine 0.6 MG tablet    allopurinol (ZYLOPRIM) 100 MG tablet    predniSONE (DELTASONE) 20 MG tablet   Recheck uric acid patient may benefit from allopurinol and keeping uric acid below 6  5. Erectile dysfunction, unspecified erectile dysfunction type  N52.9 tadalafil (CIALIS) 20 MG tablet   See below  6. Erectile dysfunction, unspecified erectile dysfunction type  N52.9 tadalafil (CIALIS) 20 MG tablet   med refill, no SE or concerns  7. Essential hypertension  V61 COMPLETE METABOLIC PANEL WITH GFR    benazepril (LOTENSIN) 20 MG tablet   well controlled, stable, last labs reviewed normal renal function and electrolytes continue benazepril 20 mg and other heart healthy diet and lifestyle  8. Mixed hyperlipidemia  Y07.3 COMPLETE METABOLIC PANEL WITH GFR    Lipid panel    atorvastatin (LIPITOR) 10 MG tablet   tolerating statin only every other night dosing, recheck liver function tests and lipid panel encouraged decreasing EtOH and and improving diet  9. Macrocytosis without anemia  D75.89 CBC with Differential/Platelet    Vitamin B12   Recheck CBC and B12, may be due  to daily alcohol consumption  10. Screening for malignant neoplasm of colon  Z12.11 Ambulatory referral to Gastroenterology  11. Other vitamin B12 deficiency anemias  D51.8 CBC with Differential/Platelet    Vitamin B12   Last B12 level was elevated he has decreased his supplement dose recheck to see if this has normalized  12. ETOHism ([place])  F10.20    Daily alcohol consumption at least 2-3 beers daily encouraged him to decrease to 4 days a week or less and try to decrease to 1-2 beers     Return in about 6 months  (around 02/03/2021) for Routine follow-up.   Delsa Grana, PA-C 08/06/20 10:07 AM

## 2020-08-07 LAB — LIPID PANEL
Cholesterol: 174 mg/dL (ref <200)
HDL: 80 mg/dL (ref >=40)
LDL Cholesterol (Calc): 78 mg/dL (calc)
Non-HDL Cholesterol (Calc): 94 mg/dL (calc) (ref <130)
Total CHOL/HDL Ratio: 2.2 (calc) (ref <5.0)
Triglycerides: 80 mg/dL (ref <150)

## 2020-08-07 LAB — CBC WITH DIFFERENTIAL/PLATELET
Absolute Monocytes: 872 cells/uL (ref 200–950)
Basophils Absolute: 62 cells/uL (ref 0–200)
Basophils Relative: 0.7 %
Eosinophils Absolute: 89 cells/uL (ref 15–500)
Eosinophils Relative: 1 %
HCT: 52.3 % — ABNORMAL HIGH (ref 38.5–50.0)
Hemoglobin: 18.5 g/dL — ABNORMAL HIGH (ref 13.2–17.1)
Lymphs Abs: 1166 cells/uL (ref 850–3900)
MCH: 35.2 pg — ABNORMAL HIGH (ref 27.0–33.0)
MCHC: 35.4 g/dL (ref 32.0–36.0)
MCV: 99.4 fL (ref 80.0–100.0)
MPV: 9 fL (ref 7.5–12.5)
Monocytes Relative: 9.8 %
Neutro Abs: 6711 cells/uL (ref 1500–7800)
Neutrophils Relative %: 75.4 %
Platelets: 225 10*3/uL (ref 140–400)
RBC: 5.26 10*6/uL (ref 4.20–5.80)
RDW: 11.8 % (ref 11.0–15.0)
Total Lymphocyte: 13.1 %
WBC: 8.9 10*3/uL (ref 3.8–10.8)

## 2020-08-07 LAB — COMPLETE METABOLIC PANEL WITH GFR
AG Ratio: 1.5 (calc) (ref 1.0–2.5)
ALT: 25 U/L (ref 9–46)
AST: 34 U/L (ref 10–35)
Albumin: 4.5 g/dL (ref 3.6–5.1)
Alkaline phosphatase (APISO): 109 U/L (ref 35–144)
BUN: 7 mg/dL (ref 7–25)
CO2: 27 mmol/L (ref 20–32)
Calcium: 9.5 mg/dL (ref 8.6–10.3)
Chloride: 99 mmol/L (ref 98–110)
Creat: 0.85 mg/dL (ref 0.70–1.18)
GFR, Est African American: 101 mL/min/{1.73_m2} (ref >=60)
GFR, Est Non African American: 87 mL/min/{1.73_m2} (ref >=60)
Globulin: 3 g/dL (calc) (ref 1.9–3.7)
Glucose, Bld: 113 mg/dL — ABNORMAL HIGH (ref 65–99)
Potassium: 3.9 mmol/L (ref 3.5–5.3)
Sodium: 138 mmol/L (ref 135–146)
Total Bilirubin: 0.9 mg/dL (ref 0.2–1.2)
Total Protein: 7.5 g/dL (ref 6.1–8.1)

## 2020-08-07 LAB — URIC ACID: Uric Acid, Serum: 7.6 mg/dL (ref 4.0–8.0)

## 2020-08-07 LAB — VITAMIN B12: Vitamin B-12: 790 pg/mL (ref 200–1100)

## 2020-08-11 ENCOUNTER — Other Ambulatory Visit: Payer: Self-pay

## 2020-08-11 ENCOUNTER — Telehealth (INDEPENDENT_AMBULATORY_CARE_PROVIDER_SITE_OTHER): Payer: Self-pay | Admitting: Gastroenterology

## 2020-08-11 DIAGNOSIS — Z1211 Encounter for screening for malignant neoplasm of colon: Secondary | ICD-10-CM

## 2020-08-11 MED ORDER — PREDNISONE 20 MG PO TABS: 40.0000 mg | ORAL_TABLET | Freq: Every day | ORAL | 0 refills | Status: AC

## 2020-08-11 MED ORDER — NA SULFATE-K SULFATE-MG SULF 17.5-3.13-1.6 GM/177ML PO SOLN: 1.0000 | Freq: Once | ORAL | 0 refills | Status: AC

## 2020-08-11 MED ORDER — ALLOPURINOL 100 MG PO TABS: 100.0000 mg | ORAL_TABLET | Freq: Every day | ORAL | 1 refills | Status: DC

## 2020-08-11 NOTE — Progress Notes (Signed)
Gastroenterology Pre-Procedure Review  Request Date: Wed 08/27/20 Requesting Physician: Dr. Tahiliani  PATIENT REVIEW QUESTIONS: The patient responded to the following health history questions as indicated:    1. Are you having any GI issues? no 2. Do you have a personal history of Polyps? no 3. Do you have a family history of Colon Cancer or Polyps? no 4. Diabetes Mellitus? no 5. Joint replacements in the past 12 months?no 6. Major health problems in the past 3 months?no 7. Any artificial heart valves, MVP, or defibrillator?no    MEDICATIONS & ALLERGIES:    Patient reports the following regarding taking any anticoagulation/antiplatelet therapy:   Plavix, Coumadin, Eliquis, Xarelto, Lovenox, Pradaxa, Brilinta, or Effient? no Aspirin? no  Patient confirms/reports the following medications:  Current Outpatient Medications  Medication Sig Dispense Refill  . aspirin EC 81 MG tablet Take 1 tablet (81 mg total) by mouth daily. Take at least 1 hour before meloxicam    . atorvastatin (LIPITOR) 10 MG tablet Take 1 tablet (10 mg total) by mouth every other day. In the evening 45 tablet 3  . benazepril (LOTENSIN) 20 MG tablet Take 1 tablet (20 mg total) by mouth daily. 90 tablet 3  . colchicine 0.6 MG tablet Take two pills at the onset of a gout attack; take one more pill one hour later 3 tablet 5  . Na Sulfate-K Sulfate-Mg Sulf 17.5-3.13-1.6 GM/177ML SOLN Take 1 kit by mouth once for 1 dose. 354 mL 0  . tadalafil (CIALIS) 20 MG tablet Take 1 tablet (20 mg total) by mouth daily as needed for erectile dysfunction. Take 30 min prior to sexual activity 10 tablet 11   No current facility-administered medications for this visit.    Patient confirms/reports the following allergies:  No Known Allergies  No orders of the defined types were placed in this encounter.   AUTHORIZATION INFORMATION Primary Insurance: 1D#: Group #:  Secondary Insurance: 1D#: Group #:  SCHEDULE  INFORMATION: Date: Wednesday 08/27/20 Time: Location:[facility] 

## 2020-08-25 ENCOUNTER — Other Ambulatory Visit
Admission: RE | Admit: 2020-08-25 | Discharge: 2020-08-25 | Disposition: A | Discharge status: Home / Self Care | Payer: 59 | Source: Ambulatory Visit | Attending: Gastroenterology | Admitting: Gastroenterology

## 2020-08-25 ENCOUNTER — Other Ambulatory Visit: Payer: Self-pay

## 2020-08-25 DIAGNOSIS — Z01812 Encounter for preprocedural laboratory examination: Secondary | ICD-10-CM | POA: Diagnosis not present

## 2020-08-25 DIAGNOSIS — Z20822 Contact with and (suspected) exposure to covid-19: Secondary | ICD-10-CM | POA: Insufficient documentation

## 2020-08-25 LAB — SARS CORONAVIRUS 2 (TAT 6-24 HRS): SARS Coronavirus 2: NEGATIVE

## 2020-08-27 ENCOUNTER — Encounter
Admission: RE | Disposition: A | Discharge status: Home / Self Care | Payer: Self-pay | Source: Home / Self Care | Attending: Gastroenterology

## 2020-08-27 ENCOUNTER — Ambulatory Visit: Payer: 59 | Admitting: Anesthesiology

## 2020-08-27 ENCOUNTER — Other Ambulatory Visit: Payer: Self-pay

## 2020-08-27 ENCOUNTER — Ambulatory Visit
Admission: RE | Admit: 2020-08-27 | Discharge: 2020-08-27 | Disposition: A | Discharge status: Home / Self Care | Payer: 59 | Attending: Gastroenterology | Admitting: Gastroenterology

## 2020-08-27 ENCOUNTER — Encounter: Payer: Self-pay | Admitting: Gastroenterology

## 2020-08-27 DIAGNOSIS — Z806 Family history of leukemia: Secondary | ICD-10-CM | POA: Insufficient documentation

## 2020-08-27 DIAGNOSIS — D120 Benign neoplasm of cecum: Secondary | ICD-10-CM | POA: Diagnosis not present

## 2020-08-27 DIAGNOSIS — Z79899 Other long term (current) drug therapy: Secondary | ICD-10-CM | POA: Insufficient documentation

## 2020-08-27 DIAGNOSIS — Z87891 Personal history of nicotine dependence: Secondary | ICD-10-CM | POA: Insufficient documentation

## 2020-08-27 DIAGNOSIS — Z791 Long term (current) use of non-steroidal anti-inflammatories (NSAID): Secondary | ICD-10-CM | POA: Insufficient documentation

## 2020-08-27 DIAGNOSIS — Z1211 Encounter for screening for malignant neoplasm of colon: Secondary | ICD-10-CM | POA: Diagnosis not present

## 2020-08-27 DIAGNOSIS — Z823 Family history of stroke: Secondary | ICD-10-CM | POA: Insufficient documentation

## 2020-08-27 DIAGNOSIS — Z8249 Family history of ischemic heart disease and other diseases of the circulatory system: Secondary | ICD-10-CM | POA: Diagnosis not present

## 2020-08-27 DIAGNOSIS — D124 Benign neoplasm of descending colon: Secondary | ICD-10-CM | POA: Insufficient documentation

## 2020-08-27 DIAGNOSIS — Z7982 Long term (current) use of aspirin: Secondary | ICD-10-CM | POA: Insufficient documentation

## 2020-08-27 DIAGNOSIS — Z809 Family history of malignant neoplasm, unspecified: Secondary | ICD-10-CM | POA: Diagnosis not present

## 2020-08-27 DIAGNOSIS — C182 Malignant neoplasm of ascending colon: Secondary | ICD-10-CM | POA: Diagnosis not present

## 2020-08-27 DIAGNOSIS — K648 Other hemorrhoids: Secondary | ICD-10-CM | POA: Insufficient documentation

## 2020-08-27 DIAGNOSIS — D122 Benign neoplasm of ascending colon: Secondary | ICD-10-CM | POA: Diagnosis not present

## 2020-08-27 DIAGNOSIS — K635 Polyp of colon: Secondary | ICD-10-CM

## 2020-08-27 DIAGNOSIS — Z833 Family history of diabetes mellitus: Secondary | ICD-10-CM | POA: Diagnosis not present

## 2020-08-27 DIAGNOSIS — D126 Benign neoplasm of colon, unspecified: Secondary | ICD-10-CM | POA: Diagnosis not present

## 2020-08-27 HISTORY — PX: COLONOSCOPY WITH PROPOFOL: SHX5780

## 2020-08-27 SURGERY — COLONOSCOPY WITH PROPOFOL
Anesthesia: General

## 2020-08-27 MED ORDER — PROPOFOL 10 MG/ML IV BOLUS
INTRAVENOUS | Status: DC | PRN
  Administered 2020-08-27: 20 mg via INTRAVENOUS
  Administered 2020-08-27: 30 mg via INTRAVENOUS
  Administered 2020-08-27: 40 mg via INTRAVENOUS
  Administered 2020-08-27: 30 mg via INTRAVENOUS
  Administered 2020-08-27: 60 mg via INTRAVENOUS

## 2020-08-27 MED ORDER — LIDOCAINE HCL (CARDIAC) PF 100 MG/5ML IV SOSY
PREFILLED_SYRINGE | INTRAVENOUS | Status: DC | PRN
  Administered 2020-08-27: 50 mg via INTRAVENOUS

## 2020-08-27 MED ORDER — PROPOFOL 500 MG/50ML IV EMUL
INTRAVENOUS | Status: AC
  Filled 2020-08-27: qty 50

## 2020-08-27 MED ORDER — PROPOFOL 500 MG/50ML IV EMUL
INTRAVENOUS | Status: DC | PRN
  Administered 2020-08-27: 150 ug/kg/min via INTRAVENOUS

## 2020-08-27 MED ORDER — PHENYLEPHRINE HCL (PRESSORS) 10 MG/ML IV SOLN
INTRAVENOUS | Status: DC | PRN
  Administered 2020-08-27: 50 ug via INTRAVENOUS
  Administered 2020-08-27: 150 ug via INTRAVENOUS
  Administered 2020-08-27: 50 ug via INTRAVENOUS

## 2020-08-27 MED ORDER — SODIUM CHLORIDE 0.9 % IV SOLN: INTRAVENOUS | Status: DC

## 2020-08-27 NOTE — Transfer of Care (Signed)
Immediate Anesthesia Transfer of Care Note  Patient: James Medina  Procedure(s) Performed: COLONOSCOPY WITH PROPOFOL (N/A )  Patient Location: PACU  Anesthesia Type:General  Level of Consciousness: awake, alert  and oriented  Airway & Oxygen Therapy: Patient Spontanous Breathing  Post-op Assessment: Report given to RN and Post -op Vital signs reviewed and stable  Post vital signs: Reviewed and stable  Last Vitals:  Vitals Value Taken Time  BP 95/60 08/27/20 1141  Temp 36.1 C 08/27/20 1141  Pulse 78 08/27/20 1144  Resp 16 08/27/20 1144  SpO2 98 % 08/27/20 1144    Last Pain:  Vitals:   08/27/20 1141  TempSrc: Temporal  PainSc: 0-No pain         Complications: No complications documented.

## 2020-08-27 NOTE — Anesthesia Postprocedure Evaluation (Signed)
Anesthesia Post Note  Patient: James Medina  Procedure(s) Performed: COLONOSCOPY WITH PROPOFOL (N/A )  Patient location during evaluation: Endoscopy Anesthesia Type: General Level of consciousness: awake and alert and oriented Pain management: pain level controlled Vital Signs Assessment: post-procedure vital signs reviewed and stable Respiratory status: spontaneous breathing, nonlabored ventilation and respiratory function stable Cardiovascular status: blood pressure returned to baseline and stable Postop Assessment: no signs of nausea or vomiting Anesthetic complications: no   No complications documented.   Last Vitals:  Vitals:   08/27/20 1151 08/27/20 1201  BP: (!) 114/55 122/81  Pulse: 72 72  Resp: 15 16  Temp:    SpO2: 98% 98%    Last Pain:  Vitals:   08/27/20 1201  TempSrc:   PainSc: 0-No pain                 Kailyn Vanderslice

## 2020-08-27 NOTE — Op Note (Signed)
[facility] Gastroenterology Patient Name: James Medina Procedure Date: 08/27/2020 10:42 AM MRN: [id number] Account #: [id number] Date of Birth: July 07, 1945 Admit Type: Outpatient Age: 75 Room: [facility] ENDO ROOM 1 Gender: Male Note Status: Finalized Procedure:             Colonoscopy Indications:           Screening for colorectal malignant neoplasm Providers:             Ashliegh Parekh B. Bonna Gains MD, MD Referring MD:          Delsa Grana (Referring MD) Medicines:             Monitored Anesthesia Care Complications:         No immediate complications. Procedure:             Pre-Anesthesia Assessment:                        - ASA Grade Assessment: II - A patient with mild                         systemic disease.                        - Prior to the procedure, a History and Physical was                         performed, and patient medications, allergies and                         sensitivities were reviewed. The patient's tolerance                         of previous anesthesia was reviewed.                        - The risks and benefits of the procedure and the                         sedation options and risks were discussed with the                         patient. All questions were answered and informed                         consent was obtained.                        - Patient identification and proposed procedure were                         verified prior to the procedure by the physician, the                         nurse, the anesthesiologist, the anesthetist and the                         technician. The procedure was verified in the                         procedure room.  After obtaining informed consent, the colonoscope was                         passed under direct vision. Throughout the procedure,                         the patient's blood pressure, pulse, and oxygen                         saturations were monitored  continuously. The                         Colonoscope was introduced through the anus and                         advanced to the the cecum, identified by appendiceal                         orifice and ileocecal valve. The colonoscopy was                         performed with ease. The patient tolerated the                         procedure well. The quality of the bowel preparation                         was poor. Findings:      The perianal and digital rectal examinations were normal.      A 4 mm polyp was found in the cecum. The polyp was sessile. The polyp       was removed with a jumbo cold forceps. Resection and retrieval were       complete.      Four sessile polyps were found in the descending colon and ascending       colon. The polyps were 5 to 6 mm in size. These polyps were removed with       a cold snare. Resection and retrieval were complete.      The exam was otherwise without abnormality.      Non-bleeding internal hemorrhoids were found during retroflexion. Impression:            - Preparation of the colon was poor.                        - One 4 mm polyp in the cecum, removed with a jumbo                         cold forceps. Resected and retrieved.                        - Four 5 to 6 mm polyps in the descending colon and in                         the ascending colon, removed with a cold snare.                         Resected and retrieved.                        -  The examination was otherwise normal.                        - Non-bleeding internal hemorrhoids. Recommendation:        - Await pathology results.                        - Discharge patient to home (with escort).                        - Advance diet as tolerated.                        - Continue present medications.                        - Repeat colonoscopy in 6 months, with 2 day prep,                         because the bowel preparation was suboptimal.                        - The findings and  recommendations were discussed with                         the patient.                        - The findings and recommendations were discussed with                         the patient's family.                        - Return to primary care physician as previously                         scheduled. Procedure Code(s):     --- Professional ---                        [phone], Colonoscopy, flexible; with removal of                         tumor(s), polyp(s), or other lesion(s) by snare                         technique                        45380, 71, Colonoscopy, flexible; with biopsy, single                         or multiple Diagnosis Code(s):     --- Professional ---                        K63.5, Polyp of colon                        Z12.11, Encounter for screening for malignant neoplasm  of colon CPT copyright 2019 American Medical Association. All rights reserved. The codes documented in this report are preliminary and upon coder review may  be revised to meet current compliance requirements.  Vonda Antigua, MD Margretta Sidle B. Bonna Gains MD, MD 08/27/2020 11:49:41 AM This report has been signed electronically. Number of Addenda: 0 Note Initiated On: 08/27/2020 10:42 AM Scope Withdrawal Time: 0 hours 21 minutes 45 seconds  Total Procedure Duration: 0 hours 30 minutes 29 seconds  Estimated Blood Loss:  Estimated blood loss: none.      [facility]

## 2020-08-27 NOTE — H&P (Signed)
Vonda Antigua, MD [address] [place], [place], [place], [id number] [id number], [place], [place], [place], [address] Phone: [phone]  Fax: [phone]  Primary Care Physician:  Delsa Grana, PA-C   Pre-Procedure History & Physical: HPI:  James Medina is a 75 y.o. male is here for a colonoscopy.   Past Medical History:  Diagnosis Date  . Alcoholism ([place]) 10/18/2017  . ED (erectile dysfunction)   . Hyperlipidemia   . Hypertension   . Impotence   . Marijuana use 01/25/2018  . OA (osteoarthritis)    hand  . Obstructive sleep apnea 07/14/2018   Managed by pulmonologist  . Sleep apnea     Past Surgical History:  Procedure Laterality Date  . APPENDECTOMY    . broken ankle      Prior to Admission medications   Medication Sig Start Date End Date Taking? Authorizing Provider  allopurinol (ZYLOPRIM) 100 MG tablet Take 1 tablet (100 mg total) by mouth daily. 08/11/20  Yes Delsa Grana, PA-C  aspirin EC 81 MG tablet Take 1 tablet (81 mg total) by mouth daily. Take at least 1 hour before meloxicam 03/25/17  Yes Lada, Satira Anis, MD  atorvastatin (LIPITOR) 10 MG tablet Take 1 tablet (10 mg total) by mouth every other day. In the evening 08/06/20  Yes Delsa Grana, PA-C  benazepril (LOTENSIN) 20 MG tablet Take 1 tablet (20 mg total) by mouth daily. 08/06/20  Yes Delsa Grana, PA-C  colchicine 0.6 MG tablet Take two pills at the onset of a gout attack; take one more pill one hour later 08/06/20  Yes Delsa Grana, PA-C  tadalafil (CIALIS) 20 MG tablet Take 1 tablet (20 mg total) by mouth daily as needed for erectile dysfunction. Take 30 min prior to sexual activity 08/06/20   Delsa Grana, PA-C    Allergies as of 08/11/2020  . (No Known Allergies)    Family History  Problem Relation Age of Onset  . Cancer Mother        leukemia  . Heart disease Father   . Learning disabilities Maternal Uncle   . Diabetes Brother   . Stroke Brother   . Diabetes Brother   . Cancer  Maternal Grandmother   . Emphysema Maternal Grandfather   . Heart disease Sister        pacemaker  . COPD Neg Hx   . Hypertension Neg Hx     Social History   Socioeconomic History  . Marital status: Married    Spouse name: Not on file  . Number of children: 2  . Years of education: Not on file  . Highest education level: Some college, no degree  Occupational History  . Not on file  Tobacco Use  . Smoking status: Former Smoker    Packs/day: 1.00    Years: 15.00    Pack years: 15.00    Types: Cigars, Cigarettes    Quit date: 04/06/2012    Years since quitting: 8.3  . Smokeless tobacco: Current User    Types: Chew  . Tobacco comment: small amount per pt  Vaping Use  . Vaping Use: Never used  Substance and Sexual Activity  . Alcohol use: Yes    Alcohol/week: 1.0 - 2.0 standard drink    Types: 1 - 2 Cans of beer per week    Comment: occasionally wine onhce 1 month   . Drug use: Not Currently  . Sexual activity: Yes  Other Topics Concern  . Not on file  Social  History Narrative   Married, still works as Museum/gallery conservator.    Social Determinants of Health   Financial Resource Strain: Low Risk   . Difficulty of Paying Living Expenses: Not hard at all  Food Insecurity: Unknown  . Worried About Charity fundraiser in the Last Year: Not on file  . Ran Out of Food in the Last Year: Never true  Transportation Needs: No Transportation Needs  . Lack of Transportation (Medical): No  . Lack of Transportation (Non-Medical): No  Physical Activity: Inactive  . Days of Exercise per Week: 0 days  . Minutes of Exercise per Session: 0 min  Stress: No Stress Concern Present  . Feeling of Stress : Not at all  Social Connections: Moderately Isolated  . Frequency of Communication with Friends and Family: More than three times a week  . Frequency of Social Gatherings with Friends and Family: Three times a week  . Attends Religious Services: Never  . Active Member of Clubs or  Organizations: No  . Attends Archivist Meetings: Never  . Marital Status: Married  Human resources officer Violence: Not At Risk  . Fear of Current or Ex-Partner: No  . Emotionally Abused: No  . Physically Abused: No  . Sexually Abused: No    Review of Systems: See HPI, otherwise negative ROS  Physical Exam: BP 99/69   Pulse (!) 103   Temp (!) 97.2 F (36.2 C) (Temporal)   Resp 18   Ht 6' (1.829 m)   Wt 99.8 kg   SpO2 97%   BMI 29.84 kg/m  General:   Alert,  pleasant and cooperative in NAD Head:  Normocephalic and atraumatic. Neck:  Supple; no masses or thyromegaly. Lungs:  Clear throughout to auscultation, normal respiratory effort.    Heart:  +S1, +S2, Regular rate and rhythm, No edema. Abdomen:  Soft, nontender and nondistended. Normal bowel sounds, without guarding, and without rebound.   Neurologic:  Alert and  oriented x4;  grossly normal neurologically.  Impression/Plan: James Medina is here for a colonoscopy to be performed for average risk screening.  Risks, benefits, limitations, and alternatives regarding  colonoscopy have been reviewed with the patient.  Questions have been answered.  All parties agreeable.   Virgel Manifold, MD  08/27/2020, 10:52 AM

## 2020-08-27 NOTE — Anesthesia Procedure Notes (Signed)
Date/Time: 08/27/2020 10:40 AM Performed by: Johnna Acosta, CRNA Pre-anesthesia Checklist: Patient identified, Emergency Drugs available, Suction available, Patient being monitored and Timeout performed Patient Re-evaluated:Patient Re-evaluated prior to induction Oxygen Delivery Method: Nasal cannula Preoxygenation: Pre-oxygenation with 100% oxygen Induction Type: IV induction

## 2020-08-27 NOTE — Anesthesia Preprocedure Evaluation (Signed)
Anesthesia Evaluation  Patient identified by MRN, date of birth, ID band Patient awake    Reviewed: Allergy & Precautions, NPO status , Patient's Chart, lab work & pertinent test results  History of Anesthesia Complications Negative for: history of anesthetic complications  Airway Mallampati: II  TM Distance: >3 FB Neck ROM: Full    Dental  (+) Partial Upper   Pulmonary sleep apnea and Continuous Positive Airway Pressure Ventilation , neg COPD, former smoker,    breath sounds clear to auscultation- rhonchi (-) wheezing      Cardiovascular hypertension, Pt. on medications (-) CAD, (-) Past MI, (-) Cardiac Stents and (-) CABG  Rhythm:Regular Rate:Normal - Systolic murmurs and - Diastolic murmurs    Neuro/Psych neg Seizures negative neurological ROS  negative psych ROS   GI/Hepatic negative GI ROS, Neg liver ROS,   Endo/Other  negative endocrine ROSneg diabetes  Renal/GU negative Renal ROS     Musculoskeletal  (+) Arthritis ,   Abdominal (+) - obese,   Peds  Hematology negative hematology ROS (+)   Anesthesia Other Findings Past Medical History: 10/18/2017: Alcoholism ([place]) No date: ED (erectile dysfunction) No date: Hyperlipidemia No date: Hypertension No date: Impotence 01/25/2018: Marijuana use No date: OA (osteoarthritis)     Comment:  hand 07/14/2018: Obstructive sleep apnea     Comment:  Managed by pulmonologist No date: Sleep apnea   Reproductive/Obstetrics                             Anesthesia Physical Anesthesia Plan  ASA: II  Anesthesia Plan: General   Post-op Pain Management:    Induction: Intravenous  PONV Risk Score and Plan: 1 and Propofol infusion  Airway Management Planned: Natural Airway  Additional Equipment:   Intra-op Plan:   Post-operative Plan:   Informed Consent: I have reviewed the patients History and Physical, chart, labs and discussed the  procedure including the risks, benefits and alternatives for the proposed anesthesia with the patient or authorized representative who has indicated his/her understanding and acceptance.     Dental advisory given  Plan Discussed with: CRNA and Anesthesiologist  Anesthesia Plan Comments:         Anesthesia Quick Evaluation

## 2020-08-28 LAB — SURGICAL PATHOLOGY

## 2020-08-29 ENCOUNTER — Encounter: Payer: Self-pay | Admitting: Gastroenterology

## 2020-09-18 ENCOUNTER — Encounter: Payer: Self-pay | Admitting: Gastroenterology

## 2020-09-23 ENCOUNTER — Ambulatory Visit: Payer: Medicare Other

## 2020-09-25 ENCOUNTER — Ambulatory Visit: Payer: Medicare Other

## 2020-10-23 ENCOUNTER — Ambulatory Visit (INDEPENDENT_AMBULATORY_CARE_PROVIDER_SITE_OTHER): Payer: 59

## 2020-10-23 DIAGNOSIS — Z0000 Encounter for general adult medical examination without abnormal findings: Secondary | ICD-10-CM | POA: Diagnosis not present

## 2020-10-23 NOTE — Patient Instructions (Addendum)
Mr. James Medina , Thank you for taking time to come for your Medicare Wellness Visit. I appreciate your ongoing commitment to your health goals. Please review the following plan we discussed and let me know if I can assist you in the future.   Screening recommendations/referrals: Colonoscopy: done 08/27/20. Repeat 02/2021.   Recommended yearly ophthalmology/optometry visit for glaucoma screening and checkup Recommended yearly dental visit for hygiene and checkup  Vaccinations: Influenza vaccine: done 03/13/20 Pneumococcal vaccine: due Tdap vaccine: done 10/17/17 Shingles vaccine: done 05/02/19 & 130/21   Covid-19: done 10/26/19, 11/23/19 & 05/27/20  Advanced directives: Please bring a copy of your health care power of attorney and living will to the office at your convenience.  Conditions/risks identified: Recommend increasing physical activity   Next appointment: Follow up in one year for your annual wellness visit.   Preventive Care 30 Years and Older, Male Preventive care refers to lifestyle choices and visits with your health care provider that can promote health and wellness. What does preventive care include?  A yearly physical exam. This is also called an annual well check.  Dental exams once or twice a year.  Routine eye exams. Ask your health care provider how often you should have your eyes checked.  Personal lifestyle choices, including:  Daily care of your teeth and gums.  Regular physical activity.  Eating a healthy diet.  Avoiding tobacco and drug use.  Limiting alcohol use.  Practicing safe sex.  Taking low doses of aspirin every day.  Taking vitamin and mineral supplements as recommended by your health care provider. What happens during an annual well check? The services and screenings done by your health care provider during your annual well check will depend on your age, overall health, lifestyle risk factors, and family history of disease. Counseling  Your  health care provider may ask you questions about your:  Alcohol use.  Tobacco use.  Drug use.  Emotional well-being.  Home and relationship well-being.  Sexual activity.  Eating habits.  History of falls.  Memory and ability to understand (cognition).  Work and work Statistician. Screening  You may have the following tests or measurements:  Height, weight, and BMI.  Blood pressure.  Lipid and cholesterol levels. These may be checked every 5 years, or more frequently if you are over 37 years old.  Skin check.  Lung cancer screening. You may have this screening every year starting at age 35 if you have a 30-pack-year history of smoking and currently smoke or have quit within the past 15 years.  Fecal occult blood test (FOBT) of the stool. You may have this test every year starting at age 52.  Flexible sigmoidoscopy or colonoscopy. You may have a sigmoidoscopy every 5 years or a colonoscopy every 10 years starting at age 52.  Prostate cancer screening. Recommendations will vary depending on your family history and other risks.  Hepatitis C blood test.  Hepatitis B blood test.  Sexually transmitted disease (STD) testing.  Diabetes screening. This is done by checking your blood sugar (glucose) after you have not eaten for a while (fasting). You may have this done every 1-3 years.  Abdominal aortic aneurysm (AAA) screening. You may need this if you are a current or former smoker.  Osteoporosis. You may be screened starting at age 50 if you are at high risk. Talk with your health care provider about your test results, treatment options, and if necessary, the need for more tests. Vaccines  Your health care provider may  recommend certain vaccines, such as:  Influenza vaccine. This is recommended every year.  Tetanus, diphtheria, and acellular pertussis (Tdap, Td) vaccine. You may need a Td booster every 10 years.  Zoster vaccine. You may need this after age  6.  Pneumococcal 13-valent conjugate (PCV13) vaccine. One dose is recommended after age 52.  Pneumococcal polysaccharide (PPSV23) vaccine. One dose is recommended after age 79. Talk to your health care provider about which screenings and vaccines you need and how often you need them. This information is not intended to replace advice given to you by your health care provider. Make sure you discuss any questions you have with your health care provider. Document Released: 07/04/2015 Document Revised: 02/25/2016 Document Reviewed: 04/08/2015 Elsevier Interactive Patient Education  2017 [place] Prevention in the Home Falls can cause injuries. They can happen to people of all ages. There are many things you can do to make your home safe and to help prevent falls. What can I do on the outside of my home?  Regularly fix the edges of walkways and driveways and fix any cracks.  Remove anything that might make you trip as you walk through a door, such as a raised step or threshold.  Trim any bushes or trees on the path to your home.  Use bright outdoor lighting.  Clear any walking paths of anything that might make someone trip, such as rocks or tools.  Regularly check to see if handrails are loose or broken. Make sure that both sides of any steps have handrails.  Any raised decks and porches should have guardrails on the edges.  Have any leaves, snow, or ice cleared regularly.  Use sand or salt on walking paths during winter.  Clean up any spills in your garage right away. This includes oil or grease spills. What can I do in the bathroom?  Use night lights.  Install grab bars by the toilet and in the tub and shower. Do not use towel bars as grab bars.  Use non-skid mats or decals in the tub or shower.  If you need to sit down in the shower, use a plastic, non-slip stool.  Keep the floor dry. Clean up any water that spills on the floor as soon as it happens.  Remove  soap buildup in the tub or shower regularly.  Attach bath mats securely with double-sided non-slip rug tape.  Do not have throw rugs and other things on the floor that can make you trip. What can I do in the bedroom?  Use night lights.  Make sure that you have a light by your bed that is easy to reach.  Do not use any sheets or blankets that are too big for your bed. They should not hang down onto the floor.  Have a firm chair that has side arms. You can use this for support while you get dressed.  Do not have throw rugs and other things on the floor that can make you trip. What can I do in the kitchen?  Clean up any spills right away.  Avoid walking on wet floors.  Keep items that you use a lot in easy-to-reach places.  If you need to reach something above you, use a strong step stool that has a grab bar.  Keep electrical cords out of the way.  Do not use floor polish or wax that makes floors slippery. If you must use wax, use non-skid floor wax.  Do not have throw rugs and  other things on the floor that can make you trip. What can I do with my stairs?  Do not leave any items on the stairs.  Make sure that there are handrails on both sides of the stairs and use them. Fix handrails that are broken or loose. Make sure that handrails are as long as the stairways.  Check any carpeting to make sure that it is firmly attached to the stairs. Fix any carpet that is loose or worn.  Avoid having throw rugs at the top or bottom of the stairs. If you do have throw rugs, attach them to the floor with carpet tape.  Make sure that you have a light switch at the top of the stairs and the bottom of the stairs. If you do not have them, ask someone to add them for you. What else can I do to help prevent falls?  Wear shoes that:  Do not have high heels.  Have rubber bottoms.  Are comfortable and fit you well.  Are closed at the toe. Do not wear sandals.  If you use a  stepladder:  Make sure that it is fully opened. Do not climb a closed stepladder.  Make sure that both sides of the stepladder are locked into place.  Ask someone to hold it for you, if possible.  Clearly mark and make sure that you can see:  Any grab bars or handrails.  First and last steps.  Where the edge of each step is.  Use tools that help you move around (mobility aids) if they are needed. These include:  Canes.  Walkers.  Scooters.  Crutches.  Turn on the lights when you go into a dark area. Replace any light bulbs as soon as they burn out.  Set up your furniture so you have a clear path. Avoid moving your furniture around.  If any of your floors are uneven, fix them.  If there are any pets around you, be aware of where they are.  Review your medicines with your doctor. Some medicines can make you feel dizzy. This can increase your chance of falling. Ask your doctor what other things that you can do to help prevent falls. This information is not intended to replace advice given to you by your health care provider. Make sure you discuss any questions you have with your health care provider. Document Released: 04/03/2009 Document Revised: 11/13/2015 Document Reviewed: 07/12/2014 Elsevier Interactive Patient Education  2017 [employer].

## 2020-10-23 NOTE — Progress Notes (Signed)
Subjective:   James Medina is a 75 y.o. male who presents for Medicare Annual/Subsequent preventive examination.  Virtual Visit via Telephone Note  I connected with  James Medina on 10/23/20 at 10:00 AM EDT by telephone and verified that I am speaking with the correct person using two identifiers.  Location: Patient: home Provider: [place] Persons participating in the virtual visit: [place]   I discussed the limitations, risks, security and privacy concerns of performing an evaluation and management service by telephone and the availability of in person appointments. The patient expressed understanding and agreed to proceed.  Interactive audio and video telecommunications were attempted between this nurse and patient, however failed, due to patient having technical difficulties OR patient did not have access to video capability.  We continued and completed visit with audio only.  Some vital signs may be absent or patient reported.   Clemetine Marker, LPN    Review of Systems     Cardiac Risk Factors include: advanced age (>68men, >76 women);dyslipidemia;male gender;hypertension     Objective:    There were no vitals filed for this visit. There is no height or weight on file to calculate BMI.  Advanced Directives 10/23/2020 08/27/2020 09/20/2019 09/14/2018 03/31/2018 01/26/2018 01/12/2018  Does Patient Have a Medical Advance Directive? Yes Yes Yes No No No No  Type of Paramedic of [place];Living will Living will;Healthcare Power of [place];Living will - - - -  Copy of [place] in Chart? No - copy requested No - copy requested No - copy requested - - - -  Would patient like information on creating a medical advance directive? - - - No - Patient declined - - -    Current Medications (verified) Outpatient Encounter Medications as of 10/23/2020  Medication Sig  . allopurinol (ZYLOPRIM) 100 MG  tablet Take 1 tablet (100 mg total) by mouth daily.  Marland Kitchen atorvastatin (LIPITOR) 10 MG tablet Take 1 tablet (10 mg total) by mouth every other day. In the evening  . benazepril (LOTENSIN) 20 MG tablet Take 1 tablet (20 mg total) by mouth daily.  . colchicine 0.6 MG tablet Take two pills at the onset of a gout attack; take one more pill one hour later (Patient not taking: Reported on 10/23/2020)  . tadalafil (CIALIS) 20 MG tablet Take 1 tablet (20 mg total) by mouth daily as needed for erectile dysfunction. Take 30 min prior to sexual activity (Patient not taking: Reported on 10/23/2020)  . [DISCONTINUED] aspirin EC 81 MG tablet Take 1 tablet (81 mg total) by mouth daily. Take at least 1 hour before meloxicam   No facility-administered encounter medications on file as of 10/23/2020.    Allergies (verified) Patient has no known allergies.   History: Past Medical History:  Diagnosis Date  . Alcoholism ([place]) 10/18/2017  . ED (erectile dysfunction)   . Hyperlipidemia   . Hypertension   . Impotence   . Marijuana use 01/25/2018  . OA (osteoarthritis)    hand  . Obstructive sleep apnea 07/14/2018   Managed by pulmonologist  . Sleep apnea    Past Surgical History:  Procedure Laterality Date  . APPENDECTOMY    . broken ankle    . COLONOSCOPY WITH PROPOFOL N/A 08/27/2020   Procedure: COLONOSCOPY WITH PROPOFOL;  Surgeon: Virgel Manifold, MD;  Location: [facility] ENDOSCOPY;  Service: Endoscopy;  Laterality: N/A;   Family History  Problem Relation Age of Onset  . Cancer  Mother        leukemia  . Heart disease Father   . Learning disabilities Maternal Uncle   . Diabetes Brother   . Stroke Brother   . Diabetes Brother   . Cancer Maternal Grandmother   . Emphysema Maternal Grandfather   . Heart disease Sister        pacemaker  . COPD Neg Hx   . Hypertension Neg Hx    Social History   Socioeconomic History  . Marital status: Married    Spouse name: Not on file  . Number of children: 2  .  Years of education: Not on file  . Highest education level: Some college, no degree  Occupational History  . Not on file  Tobacco Use  . Smoking status: Former Smoker    Packs/day: 1.00    Years: 15.00    Pack years: 15.00    Types: Cigars, Cigarettes    Quit date: 04/06/2012    Years since quitting: 8.5  . Smokeless tobacco: Current User    Types: Chew  . Tobacco comment: small amount per pt  Vaping Use  . Vaping Use: Never used  Substance and Sexual Activity  . Alcohol use: Yes    Alcohol/week: 1.0 - 2.0 standard drink    Types: 1 - 2 Cans of beer per week  . Drug use: Not Currently  . Sexual activity: Yes  Other Topics Concern  . Not on file  Social History Narrative   Married, still works as Museum/gallery conservator.    Social Determinants of Health   Financial Resource Strain: Low Risk   . Difficulty of Paying Living Expenses: Not hard at all  Food Insecurity: No Food Insecurity  . Worried About Charity fundraiser in the Last Year: Never true  . Ran Out of Food in the Last Year: Never true  Transportation Needs: No Transportation Needs  . Lack of Transportation (Medical): No  . Lack of Transportation (Non-Medical): No  Physical Activity: Insufficiently Active  . Days of Exercise per Week: 2 days  . Minutes of Exercise per Session: 20 min  Stress: Stress Concern Present  . Feeling of Stress : To some extent  Social Connections: Moderately Isolated  . Frequency of Communication with Friends and Family: More than three times a week  . Frequency of Social Gatherings with Friends and Family: Three times a week  . Attends Religious Services: Never  . Active Member of Clubs or Organizations: No  . Attends Archivist Meetings: Never  . Marital Status: Married    Tobacco Counseling Ready to quit: Not Answered Counseling given: Not Answered Comment: small amount per pt   Clinical Intake:  Pre-visit preparation completed: Yes  Pain : No/denies pain      Nutritional Risks: None Diabetes: No  How often do you need to have someone help you when you read instructions, pamphlets, or other written materials from your doctor or pharmacy?: 1 - Never   Interpreter Needed?: No  Information entered by :: Clemetine Marker LPN   Activities of Daily Living In your present state of health, do you have any difficulty performing the following activities: 10/23/2020 08/06/2020  Hearing? N N  Comment declines hearing aids -  Vision? N N  Difficulty concentrating or making decisions? N N  Walking or climbing stairs? Y Y  Comment arthritis is knees -  Dressing or bathing? N Y  Doing errands, shopping? N N  Preparing Food and eating ? N -  Using the Toilet? N -  In the past six months, have you accidently leaked urine? N -  Do you have problems with loss of bowel control? N -  Managing your Medications? N -  Managing your Finances? N -  Housekeeping or managing your Housekeeping? N -  Some recent data might be hidden    Patient Care Team: Delsa Grana, PA-C as PCP - General (Family Medicine) Parrett, Fonnie Mu, NP as Nurse Practitioner (Pulmonary Disease) Virgel Manifold, MD as Consulting Physician (Gastroenterology)  Indicate any recent Medical Services you may have received from other than [facility] providers in the past year (date may be approximate).     Assessment:   This is a routine wellness examination for [place].  Hearing/Vision screen  Hearing Screening   125Hz  250Hz  500Hz  1000Hz  2000Hz  3000Hz  4000Hz  6000Hz  8000Hz   Right ear:           Left ear:           Comments: Pt denies hearing difficulty. Yearly hearing evaluation provided at work. Wears ear plugs all day at work.  Vision Screening Comments: Vision screening done at [place] issues and exercise activities discussed: Current Exercise Habits: Home exercise routine, Type of exercise: Other - see comments (exercise bike), Time (Minutes): 20, Frequency  (Times/Week): 2, Weekly Exercise (Minutes/Week): 40, Intensity: Mild, Exercise limited by: orthopedic condition(s)  Goals Addressed            This Visit's Progress   . Patient Stated       Patient states he plans to continue caring for his wife with recent cancer diagnosis.       Depression Screen PHQ 2/9 Scores 10/23/2020 08/06/2020 10/24/2019 09/20/2019 08/03/2019 12/21/2018 09/14/2018  PHQ - 2 Score 0 0 0 0 0 0 0  PHQ- 9 Score - 0 0 - 0 0 -    Fall Risk Fall Risk  10/23/2020 08/06/2020 10/24/2019 09/20/2019 08/03/2019  Falls in the past year? 0 - 0 1 0  Number falls in past yr: 0 0 0 1 0  Comment - - - knocked down by dog -  Injury with Fall? 0 0 0 0 0  Risk for fall due to : No Fall Risks - - No Fall Risks -  Follow up Falls prevention discussed - - Falls prevention discussed -    FALL RISK PREVENTION PERTAINING TO THE HOME:   Any stairs in or around the home? Yes  If so, are there any without handrails? No  Home free of loose throw rugs in walkways, pet beds, electrical cords, etc? Yes  Adequate lighting in your home to reduce risk of falls? Yes   ASSISTIVE DEVICES UTILIZED TO PREVENT FALLS:  Life alert? No  Use of a cane, walker or w/c? No  Grab bars in the bathroom? No  Shower chair or bench in shower? No  Elevated toilet seat or a handicapped toilet? Yes   TIMED UP AND GO:  Was the test performed? No . Telephonic visit.   Cognitive Function: Normal cognitive status assessed by direct observation by this Nurse Health Advisor. No abnormalities found.       6CIT Screen 09/20/2019 09/14/2018  What Year? 0 points 0 points  What month? 0 points 0 points  What time? 0 points 0 points  Count back from 20 0 points 0 points  Months in reverse 0 points 0 points  Repeat phrase 2 points 0 points  Total Score 2 0  Immunizations Immunization History  Administered Date(s) Administered  . Fluad Quad(high Dose 65+) 03/22/2019, 03/13/2020  . Influenza, High Dose Seasonal PF  03/25/2017  . Moderna Sars-Covid-2 Vaccination 10/26/2019, 11/23/2019, 05/27/2020  . Tdap 06/21/2009, 10/17/2017  . Zoster Recombinat (Shingrix) 05/02/2019, 07/21/2019    TDAP status: Up to date  Flu Vaccine status: Up to date  Pneumococcal vaccine status: Due, Education has been provided regarding the importance of this vaccine. Advised may receive this vaccine at local pharmacy or Health Dept. Aware to provide a copy of the vaccination record if obtained from local pharmacy or Health Dept. Verbalized acceptance and understanding.  Covid-19 vaccine status: Completed vaccines  Qualifies for Shingles Vaccine? Yes   Zostavax completed Yes   Shingrix Completed?: Yes  Screening Tests Health Maintenance  Topic Date Due  . PNA vac Low Risk Adult (1 of 2 - PCV13) 10/23/2020 (Originally 03/25/2013)  . COVID-19 Vaccine (4 - Booster for Moderna series) 11/25/2020  . INFLUENZA VACCINE  01/19/2021  . COLONOSCOPY (Pts 45-26yrs Insurance coverage will need to be confirmed)  08/27/2021  . TETANUS/TDAP  10/18/2027  . Hepatitis C Screening  Completed  . HPV VACCINES  Aged Out    Health Maintenance  There are no preventive care reminders to display for this patient.  Colorectal cancer screening: Type of screening: Colonoscopy. Completed 08/27/20. Repeat every 6 months years pt to repeat in 6 months due to suboptimal prep and presence of polyps.   Lung Cancer Screening: (Low Dose CT Chest recommended if Age 31-80 years, 30 pack-year currently smoking OR have quit w/in 15years.) does not qualify.   Additional Screening:  Hepatitis C Screening: does qualify; Completed 09/05/15  Vision Screening: Recommended annual ophthalmology exams for early detection of glaucoma and other disorders of the eye. Is the patient up to date with their annual eye exam?  Yes  Who is the provider or what is the name of the office in which the patient attends annual eye exams? [facility]   Dental Screening:  Recommended annual dental exams for proper oral hygiene  Community Resource Referral / Chronic Care Management: CRR required this visit?  No   CCM required this visit?  No      Plan:     I have personally reviewed and noted the following in the patient's chart:   . Medical and social history . Use of alcohol, tobacco or illicit drugs  . Current medications and supplements including opioid prescriptions. Patient is not currently taking opioid prescriptions. . Functional ability and status . Nutritional status . Physical activity . Advanced directives . List of other physicians . Hospitalizations, surgeries, and ER visits in previous 12 months . Vitals . Screenings to include cognitive, depression, and falls . Referrals and appointments  In addition, I have reviewed and discussed with patient certain preventive protocols, quality metrics, and best practice recommendations. A written personalized care plan for preventive services as well as general preventive health recommendations were provided to patient.     Clemetine Marker, LPN   0/11/2374   Nurse Notes: none

## 2020-12-29 ENCOUNTER — Ambulatory Visit (INDEPENDENT_AMBULATORY_CARE_PROVIDER_SITE_OTHER): Payer: Medicare Other | Admitting: Family Medicine

## 2020-12-29 ENCOUNTER — Encounter: Payer: Self-pay | Admitting: Family Medicine

## 2020-12-29 ENCOUNTER — Other Ambulatory Visit: Payer: Self-pay

## 2020-12-29 VITALS — BP 122/64 | HR 94 | Temp 98.3°F | Resp 16 | Ht 72.0 in | Wt 222.6 lb

## 2020-12-29 DIAGNOSIS — Z5181 Encounter for therapeutic drug level monitoring: Secondary | ICD-10-CM | POA: Diagnosis not present

## 2020-12-29 DIAGNOSIS — I10 Essential (primary) hypertension: Secondary | ICD-10-CM | POA: Diagnosis not present

## 2020-12-29 DIAGNOSIS — D582 Other hemoglobinopathies: Secondary | ICD-10-CM | POA: Diagnosis not present

## 2020-12-29 DIAGNOSIS — M10042 Idiopathic gout, left hand: Secondary | ICD-10-CM

## 2020-12-29 DIAGNOSIS — N529 Male erectile dysfunction, unspecified: Secondary | ICD-10-CM | POA: Diagnosis not present

## 2020-12-29 DIAGNOSIS — E782 Mixed hyperlipidemia: Secondary | ICD-10-CM

## 2020-12-29 MED ORDER — BENAZEPRIL HCL 20 MG PO TABS
20.0000 mg | ORAL_TABLET | Freq: Every day | ORAL | 11 refills | Status: DC
Start: 2020-12-29 — End: 2021-12-31

## 2020-12-29 MED ORDER — COLCHICINE 0.6 MG PO TABS
ORAL_TABLET | ORAL | 5 refills | Status: DC
Start: 2020-12-29 — End: 2021-12-31

## 2020-12-29 NOTE — Progress Notes (Signed)
Name: James Medina   MRN: [id number]    DOB: November 30, 1945   Date:12/29/2020       Progress Note  Chief Complaint  Patient presents with   Hypertension   Hyperlipidemia     Subjective:   James Medina is a 75 y.o. male, presents to clinic for f/up on routine conditions and abnormal labs  Hemoglobin  Date Value Ref Range Status  08/06/2020 18.5 (H) 13.2 - 17.1 g/dL Final  10/24/2019 16.3 13.2 - 17.1 g/dL Final  09/20/2019 17.7 (H) 13.2 - 17.1 g/dL Final  03/31/2018 16.0 13.0 - 17.0 g/dL Final  09/05/2015 15.0 12.6 - 17.7 g/dL Final  06/06/2015 14.8 12.6 - 17.7 g/dL Final  Hgb higher with last OV, he has hx of OSA, former smoker, encouraged pt to hydrate and come for recheck of H/H Not using his CPAP right now   Gout -  Lab Results  Component Value Date   LABURIC 7.6 08/06/2020   Started allopurinol 4-5 months ago 100 mg daily, due for recheck of uric acid  No recent gout flares  Hypertension:  Currently managed on benazepril 20 mg daily  Pt reports good med compliance and denies any SE.   Blood pressure today is well controlled. BP Readings from Last 3 Encounters:  12/29/20 122/64  08/27/20 122/81  08/06/20 122/76   Pt denies CP, SOB, exertional sx, LE edema, palpitation, Ha's, visual disturbances, lightheadedness, hypotension, syncope.   Current Outpatient Medications:    allopurinol (ZYLOPRIM) 100 MG tablet, Take 1 tablet (100 mg total) by mouth daily., Disp: 90 tablet, Rfl: 1   atorvastatin (LIPITOR) 10 MG tablet, Take 1 tablet (10 mg total) by mouth every other day. In the evening, Disp: 45 tablet, Rfl: 3   tadalafil (CIALIS) 20 MG tablet, Take 1 tablet (20 mg total) by mouth daily as needed for erectile dysfunction. Take 30 min prior to sexual activity, Disp: 10 tablet, Rfl: 11   benazepril (LOTENSIN) 20 MG tablet, Take 1 tablet (20 mg total) by mouth daily., Disp: 30 tablet, Rfl: 11   colchicine 0.6 MG tablet, Take two pills at the onset of a gout attack;  take one more pill one hour later, Disp: 3 tablet, Rfl: 5  Patient Active Problem List   Diagnosis Date Noted   Elevated LFTs 10/24/2019   Prediabetes 08/03/2019   Obstructive sleep apnea 07/14/2018   Pulmonary nodule 07/14/2018   Osteoarthritis of knee 04/04/2018   Thoracic aortic aneurysm without rupture ([place]) 01/25/2018   ETOHism ([place]) 10/18/2017   Osteoarthritis of both hands 10/18/2017   Gout 03/25/2017   Elevated hemoglobin ([place]) 03/49/1791   Umbilical hernia without obstruction or gangrene 04/26/2016   Impaired fasting glucose 09/16/2015   Macrocytosis without anemia 09/16/2015   Hematospermia 09/05/2015   Abnormal CBC 06/08/2015   Hyperlipidemia    Hypertension    ED (erectile dysfunction)    Impotence     Past Surgical History:  Procedure Laterality Date   APPENDECTOMY     broken ankle     COLONOSCOPY WITH PROPOFOL N/A 08/27/2020   Procedure: COLONOSCOPY WITH PROPOFOL;  Surgeon: Virgel Manifold, MD;  Location: [facility] ENDOSCOPY;  Service: Endoscopy;  Laterality: N/A;    Family History  Problem Relation Age of Onset   Cancer Mother        leukemia   Heart disease Father    Learning disabilities Maternal Uncle    Diabetes Brother    Stroke Brother    Diabetes Brother  Cancer Maternal Grandmother    Emphysema Maternal Grandfather    Heart disease Sister        pacemaker   COPD Neg Hx    Hypertension Neg Hx     Social History   Tobacco Use   Smoking status: Former    Packs/day: 1.00    Years: 15.00    Pack years: 15.00    Types: Cigars, Cigarettes    Quit date: 04/06/2012    Years since quitting: 8.7   Smokeless tobacco: Current    Types: Chew   Tobacco comments:    small amount per pt  Vaping Use   Vaping Use: Never used  Substance Use Topics   Alcohol use: Yes    Alcohol/week: 1.0 - 2.0 standard drink    Types: 1 - 2 Cans of beer per week   Drug use: Not Currently     No Known Allergies  Health Maintenance  Topic Date Due    COVID-19 Vaccine (4 - Booster for Moderna series) 08/25/2020   PNA vac Low Risk Adult (1 of 2 - PCV13) 12/29/2021 (Originally 03/25/2013)   INFLUENZA VACCINE  01/19/2021   COLONOSCOPY (Pts 45-15yrs Insurance coverage will need to be confirmed)  08/27/2021   TETANUS/TDAP  10/18/2027   Hepatitis C Screening  Completed   Zoster Vaccines- Shingrix  Completed   HPV VACCINES  Aged Out    Chart Review Today: I personally reviewed active problem list, medication list, allergies, family history, social history, health maintenance, notes from last encounter, lab results, imaging with the patient/caregiver today.   Review of Systems  Constitutional: Negative.   HENT: Negative.    Eyes: Negative.   Respiratory: Negative.    Cardiovascular: Negative.   Gastrointestinal: Negative.   Endocrine: Negative.   Genitourinary: Negative.   Musculoskeletal: Negative.   Skin: Negative.   Allergic/Immunologic: Negative.   Neurological: Negative.   Hematological: Negative.   Psychiatric/Behavioral: Negative.    All other systems reviewed and are negative.   Objective:   Vitals:   12/29/20 1456  BP: 122/64  Pulse: 94  Resp: 16  Temp: 98.3 F (36.8 C)  SpO2: 94%  Weight: 222 lb 9.6 oz (101 kg)  Height: 6' (1.829 m)    Body mass index is 30.19 kg/m.  Physical Exam Vitals and nursing note reviewed.  Constitutional:      General: He is not in acute distress.    Appearance: Normal appearance. He is well-developed. He is not ill-appearing, toxic-appearing or diaphoretic.     Interventions: Face mask in place.  HENT:     Head: Normocephalic and atraumatic.     Jaw: No trismus.     Right Ear: External ear normal.     Left Ear: External ear normal.  Eyes:     General: Lids are normal. No scleral icterus.       Right eye: No discharge.        Left eye: No discharge.     Conjunctiva/sclera: Conjunctivae normal.  Neck:     Trachea: Trachea and phonation normal. No tracheal deviation.   Cardiovascular:     Rate and Rhythm: Normal rate and regular rhythm.     Pulses: Normal pulses.          Radial pulses are 2+ on the right side and 2+ on the left side.       Posterior tibial pulses are 2+ on the right side and 2+ on the left side.     Heart sounds:  Normal heart sounds. No murmur heard.   No friction rub. No gallop.  Pulmonary:     Effort: Pulmonary effort is normal. No respiratory distress.     Breath sounds: Normal breath sounds. No stridor. No wheezing, rhonchi or rales.  Abdominal:     General: Bowel sounds are normal. There is no distension.     Palpations: Abdomen is soft.  Musculoskeletal:     Right lower leg: No edema.     Left lower leg: No edema.  Skin:    General: Skin is warm and dry.     Coloration: Skin is not jaundiced or pale.     Findings: No rash.     Nails: There is no clubbing.  Neurological:     Mental Status: He is alert. Mental status is at baseline.     Cranial Nerves: No dysarthria or facial asymmetry.     Motor: No tremor or abnormal muscle tone.     Gait: Gait normal.  Psychiatric:        Mood and Affect: Mood normal.        Speech: Speech normal.        Behavior: Behavior normal. Behavior is cooperative.        Assessment & Plan:     ICD-10-CM   1. Essential hypertension  Q65 COMPLETE METABOLIC PANEL WITH GFR    benazepril (LOTENSIN) 20 MG tablet   stable, well controlled, BP at goal - refill on meds - pharmacy only giving him 30 d supply?    2. Mixed hyperlipidemia  H84.6 COMPLETE METABOLIC PANEL WITH GFR   pt sensitive to statin, every other day dosing of 10 mg lipitor caused hand cramping and knee /leg pain, try lower dose, may need to change statin    3. Acute idiopathic gout of left hand  N62.952 COMPLETE METABOLIC PANEL WITH GFR    Uric acid    colchicine 0.6 MG tablet   not taking allopurinol daily, no recent flare, bilateral knee pain likely OA    4. Erectile dysfunction, unspecified erectile dysfunction type   N52.9     5. Elevated hemoglobin (HCC)  D58.2 CBC with Differential/Platelet   not currently smoking, hx of OSA not on CPAP, pt pushed hydration, hx of elevated in the past, monitoring, may need to go back to hematology or donate blood    6. Essential hypertension  W41 COMPLETE METABOLIC PANEL WITH GFR    benazepril (LOTENSIN) 20 MG tablet   See below    7. Acute idiopathic gout of left hand  L24.401 COMPLETE METABOLIC PANEL WITH GFR    Uric acid    colchicine 0.6 MG tablet   Recheck uric acid patient may benefit from allopurinol and keeping uric acid below 6    8. Medication monitoring encounter  U27.25 COMPLETE METABOLIC PANEL WITH GFR    CBC with Differential/Platelet    Uric acid       Return in about 6 months (around 07/01/2021) for Routine follow-up.   Delsa Grana, PA-C 12/29/20 3:39 PM

## 2020-12-30 LAB — CBC WITH DIFFERENTIAL/PLATELET
Absolute Monocytes: 937 cells/uL (ref 200–950)
Basophils Absolute: 72 cells/uL (ref 0–200)
Basophils Relative: 0.7 %
Eosinophils Absolute: 124 cells/uL (ref 15–500)
Eosinophils Relative: 1.2 %
HCT: 48.1 % (ref 38.5–50.0)
Hemoglobin: 17.1 g/dL (ref 13.2–17.1)
Lymphs Abs: 1875 cells/uL (ref 850–3900)
MCH: 34.9 pg — ABNORMAL HIGH (ref 27.0–33.0)
MCHC: 35.6 g/dL (ref 32.0–36.0)
MCV: 98.2 fL (ref 80.0–100.0)
MPV: 9 fL (ref 7.5–12.5)
Monocytes Relative: 9.1 %
Neutro Abs: 7292 cells/uL (ref 1500–7800)
Neutrophils Relative %: 70.8 %
Platelets: 245 10*3/uL (ref 140–400)
RBC: 4.9 10*6/uL (ref 4.20–5.80)
RDW: 11.3 % (ref 11.0–15.0)
Total Lymphocyte: 18.2 %
WBC: 10.3 10*3/uL (ref 3.8–10.8)

## 2020-12-30 LAB — COMPLETE METABOLIC PANEL WITH GFR
AG Ratio: 1.5 (calc) (ref 1.0–2.5)
ALT: 59 U/L — ABNORMAL HIGH (ref 9–46)
AST: 64 U/L — ABNORMAL HIGH (ref 10–35)
Albumin: 4.4 g/dL (ref 3.6–5.1)
Alkaline phosphatase (APISO): 73 U/L (ref 35–144)
BUN: 10 mg/dL (ref 7–25)
CO2: 26 mmol/L (ref 20–32)
Calcium: 9.2 mg/dL (ref 8.6–10.3)
Chloride: 95 mmol/L — ABNORMAL LOW (ref 98–110)
Creat: 0.87 mg/dL (ref 0.70–1.28)
Globulin: 3 g/dL (calc) (ref 1.9–3.7)
Glucose, Bld: 103 mg/dL — ABNORMAL HIGH (ref 65–99)
Potassium: 3.9 mmol/L (ref 3.5–5.3)
Sodium: 133 mmol/L — ABNORMAL LOW (ref 135–146)
Total Bilirubin: 0.8 mg/dL (ref 0.2–1.2)
Total Protein: 7.4 g/dL (ref 6.1–8.1)
eGFR: 92 mL/min/{1.73_m2} (ref >=60)

## 2020-12-30 LAB — URIC ACID: Uric Acid, Serum: 6.5 mg/dL (ref 4.0–8.0)

## 2021-01-06 ENCOUNTER — Encounter: Payer: Self-pay | Admitting: Family Medicine

## 2021-01-06 ENCOUNTER — Telehealth (INDEPENDENT_AMBULATORY_CARE_PROVIDER_SITE_OTHER): Payer: Medicare Other | Admitting: Family Medicine

## 2021-01-06 VITALS — Wt 222.0 lb

## 2021-01-06 DIAGNOSIS — M10042 Idiopathic gout, left hand: Secondary | ICD-10-CM | POA: Diagnosis not present

## 2021-01-06 DIAGNOSIS — E871 Hypo-osmolality and hyponatremia: Secondary | ICD-10-CM

## 2021-01-06 DIAGNOSIS — R7989 Other specified abnormal findings of blood chemistry: Secondary | ICD-10-CM | POA: Diagnosis not present

## 2021-01-06 DIAGNOSIS — F1020 Alcohol dependence, uncomplicated: Secondary | ICD-10-CM | POA: Diagnosis not present

## 2021-01-06 MED ORDER — ALLOPURINOL 100 MG PO TABS: 100.0000 mg | ORAL_TABLET | Freq: Every day | ORAL | 3 refills | Status: DC

## 2021-01-06 NOTE — Progress Notes (Signed)
Name: James Medina   MRN: [id number]    DOB: 02-16-1946   Date:01/06/2021       Progress Note  Subjective:    Chief Complaint  Chief Complaint  Patient presents with   Labs Only    Review    I connected with  James Medina on 01/06/21 at 10:20 AM EDT by telephone and verified that I am speaking with the correct person using two identifiers.   I discussed the limitations, risks, security and privacy concerns of performing an evaluation and management service by telephone and the availability of in person appointments. Staff also discussed with the patient that there may be a patient responsible charge related to this service.  Patient verbalized understanding and agreed to proceed with encounter. Patient Location:  home Provider Location: [facility] Additional Individuals present: none  HPI  Elevated LFTs on lab work, has had mild AST elevation in the past, higher AST and ALT with last labs Also mild electrolyte abnormality, mild hyponatremia and low chloride Pt states he is drinking 3-4 beers nightly after dinner, denies any other recent med us/OTC or tylenol med use, any supplements/herbal meds, recent GI illness He recently had not been taking his cholesterol med and allopurinol  He plans to restart atorvastatin every other day, gets cramps in hands - trying lower dose  Gout -  Lab Results  Component Value Date   LABURIC 6.5 12/29/2020  Above goal, diffuse joint pain but no recent gouty flares He feels better with arthralgias    Patient Active Problem List   Diagnosis Date Noted   Elevated LFTs 10/24/2019   Prediabetes 08/03/2019   Obstructive sleep apnea 07/14/2018   Pulmonary nodule 07/14/2018   Osteoarthritis of knee 04/04/2018   Thoracic aortic aneurysm without rupture ([place]) 01/25/2018   ETOHism ([place]) 10/18/2017   Osteoarthritis of both hands 10/18/2017   Gout 03/25/2017   Elevated hemoglobin ([place]) 02/01/4817   Umbilical hernia without obstruction or  gangrene 04/26/2016   Impaired fasting glucose 09/16/2015   Macrocytosis without anemia 09/16/2015   Hematospermia 09/05/2015   Abnormal CBC 06/08/2015   Hyperlipidemia    Hypertension    ED (erectile dysfunction)    Impotence     Social History   Tobacco Use   Smoking status: Former    Packs/day: 1.00    Years: 15.00    Pack years: 15.00    Types: Cigars, Cigarettes    Quit date: 04/06/2012    Years since quitting: 8.7   Smokeless tobacco: Current    Types: Chew   Tobacco comments:    small amount per pt  Substance Use Topics   Alcohol use: Yes    Alcohol/week: 1.0 - 2.0 standard drink    Types: 1 - 2 Cans of beer per week     Current Outpatient Medications:    allopurinol (ZYLOPRIM) 100 MG tablet, Take 1 tablet (100 mg total) by mouth daily., Disp: 90 tablet, Rfl: 1   atorvastatin (LIPITOR) 10 MG tablet, Take 1 tablet (10 mg total) by mouth every other day. In the evening, Disp: 45 tablet, Rfl: 3   benazepril (LOTENSIN) 20 MG tablet, Take 1 tablet (20 mg total) by mouth daily., Disp: 30 tablet, Rfl: 11   colchicine 0.6 MG tablet, Take two pills at the onset of a gout attack; take one more pill one hour later, Disp: 3 tablet, Rfl: 5   tadalafil (CIALIS) 20 MG tablet, Take 1 tablet (20 mg total) by mouth daily  as needed for erectile dysfunction. Take 30 min prior to sexual activity, Disp: 10 tablet, Rfl: 11  No Known Allergies  Chart Review: I personally reviewed active problem list, medication list, allergies, family history, social history, health maintenance, notes from last encounter, lab results, imaging with the patient/caregiver today.    Review of Systems  Constitutional: Negative.   HENT: Negative.    Eyes: Negative.   Respiratory: Negative.    Cardiovascular: Negative.  Negative for chest pain, palpitations and leg swelling.  Gastrointestinal: Negative.  Negative for abdominal pain, diarrhea, nausea and vomiting.  Endocrine: Negative.   Genitourinary:  Negative.   Musculoskeletal: Negative.   Skin: Negative.   Allergic/Immunologic: Negative.   Neurological: Negative.   Hematological: Negative.   Psychiatric/Behavioral: Negative.    All other systems reviewed and are negative.   Objective:    Virtual encounter, vitals limited, only able to obtain the following Today's Vitals   01/06/21 1016  Weight: 222 lb (100.7 kg)   Body mass index is 30.11 kg/m. Nursing Note and Vital Signs reviewed.  Physical Exam Pt alert, phonation clear, answering questions appropriately   PE limited by telephone encounter  No results found for this or any previous visit (from the past 72 hour(s)).  Assessment and Plan:     ICD-10-CM   1. Elevated LFTs  R79.89    suspected from daily ETOH? recheck at next appt, advised decreasing ETOH, avoid tylenol    2. Hyponatremia  E87.1    mild electrolyte abnormalities, may be from ETOH    3. Alcohol dependence, daily use (HCC)  F10.20    states 4 beers a night, advised decreasing to 1-2 beers for 4d or less per week, gradual reduction    4. Acute idiopathic gout of left hand  M10.042 allopurinol (ZYLOPRIM) 100 MG tablet   Uric acid above goal, pt was not taking allopurinol regularly, will take daily     Plan to recheck labs and next OV Will need to do work up for elevated LFTs if worsening, right now mildly elevated 2x upper limit of normal - would refer is 3x +     -Red flags and when to present for emergency care or RTC including but not limited to new/worsening/un-resolving symptoms,  reviewed with patient at time of visit. Follow up and care instructions discussed and provided in AVS. - I discussed the assessment and treatment plan with the patient. The patient was provided an opportunity to ask questions and all were answered. The patient agreed with the plan and demonstrated an understanding of the instructions.  - The patient was advised to call back or seek an in-person evaluation if the  symptoms worsen or if the condition fails to improve as anticipated.  I provided 15 minutes of non-face-to-face time during this encounter.  Delsa Grana, PA-C 01/06/21 10:20 AM

## 2021-01-23 IMAGING — US US ABDOMEN LIMITED
1 series · 14 of 25 positions shown · non-contrast
Comparison: CT abdomen and pelvis October 17, 2017

CLINICAL DATA: Elevated liver enzymes

EXAM:
ULTRASOUND ABDOMEN LIMITED RIGHT UPPER QUADRANT

[Series 1: us abdomen limited · 0.27mm/px · 14 of 57 slices shown]
[im 1/57]
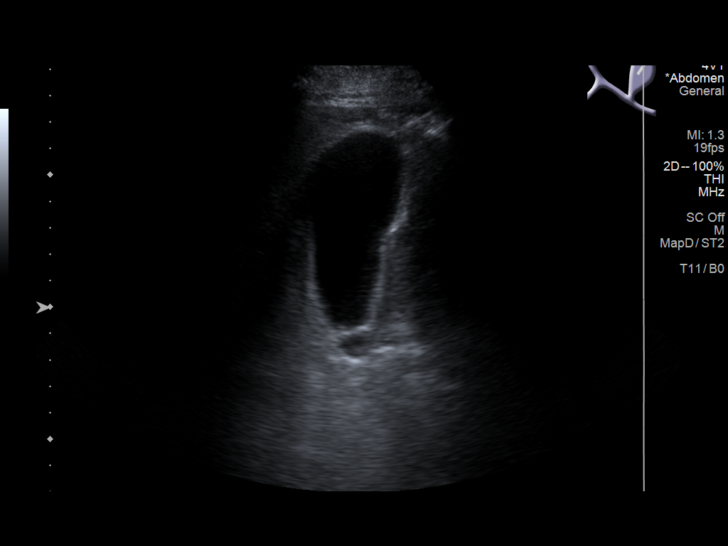
[im 5/57]
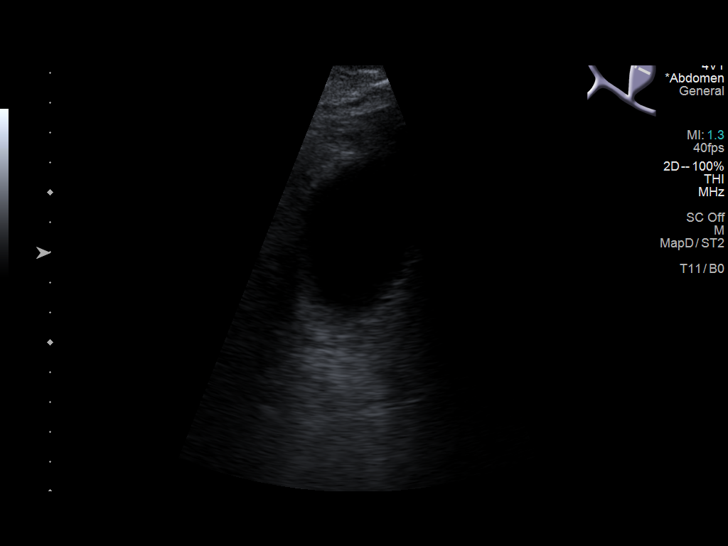
[im 10/57]
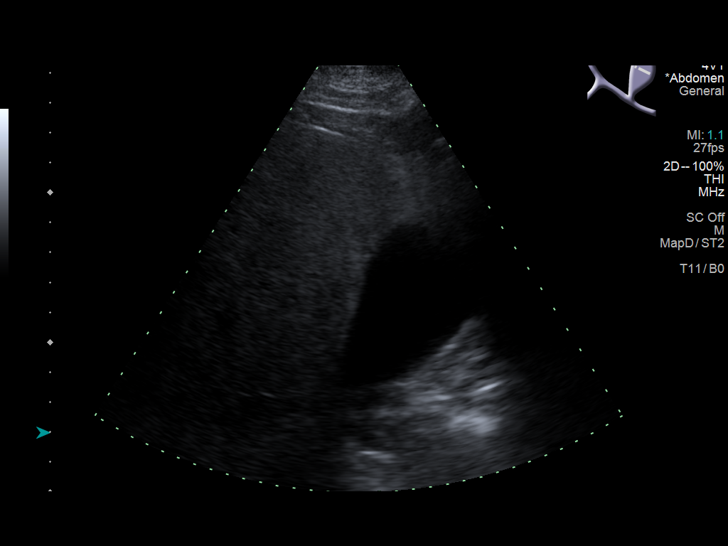
[im 15/57]
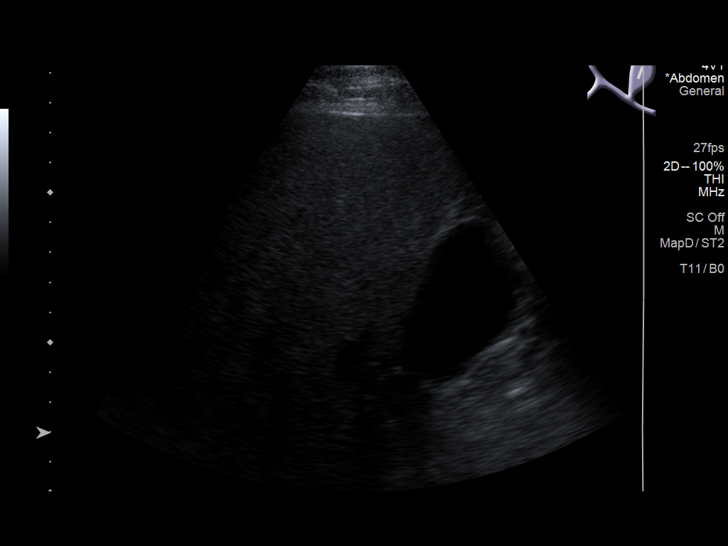
[im 19/57]
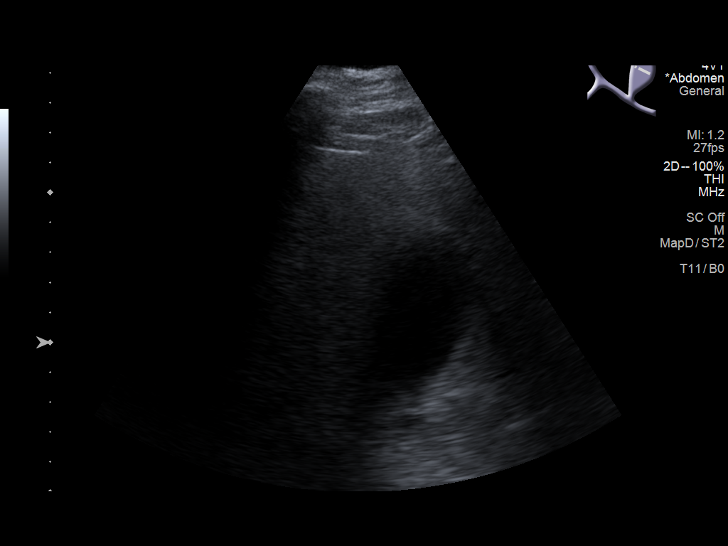
[im 22/57]
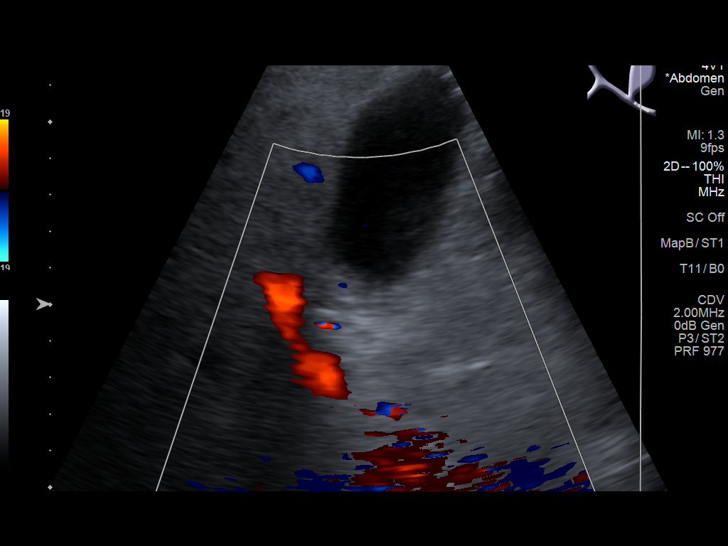
[im 26/57]
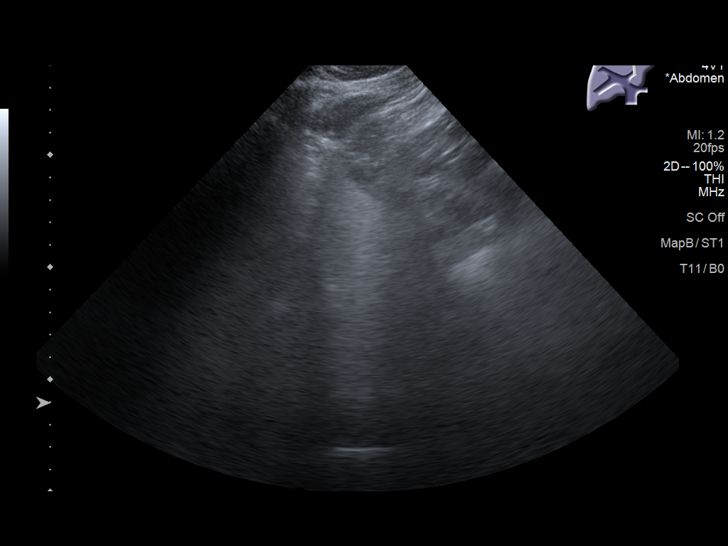
[im 31/57]
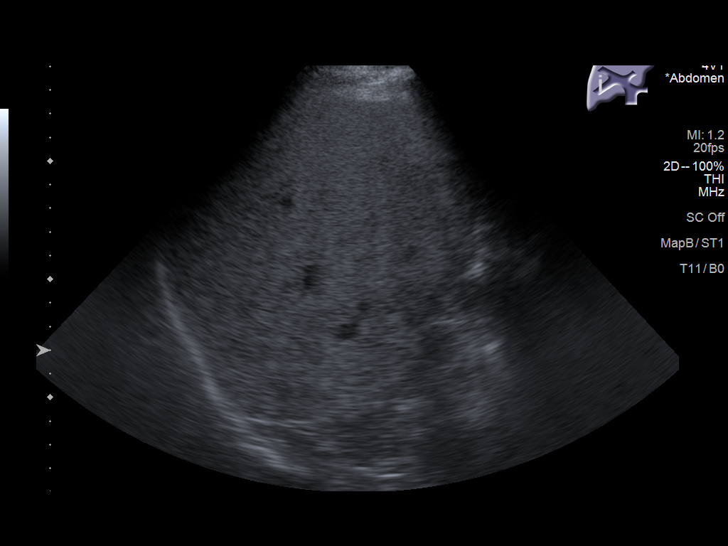
[im 36/57]
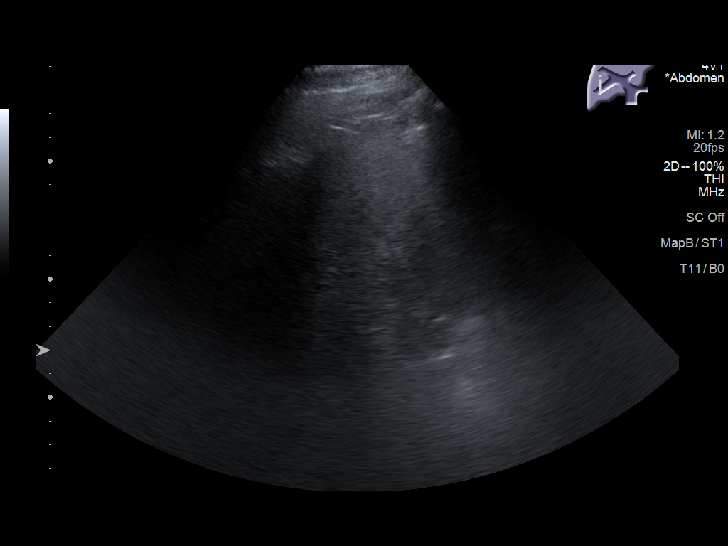
[im 38/57]
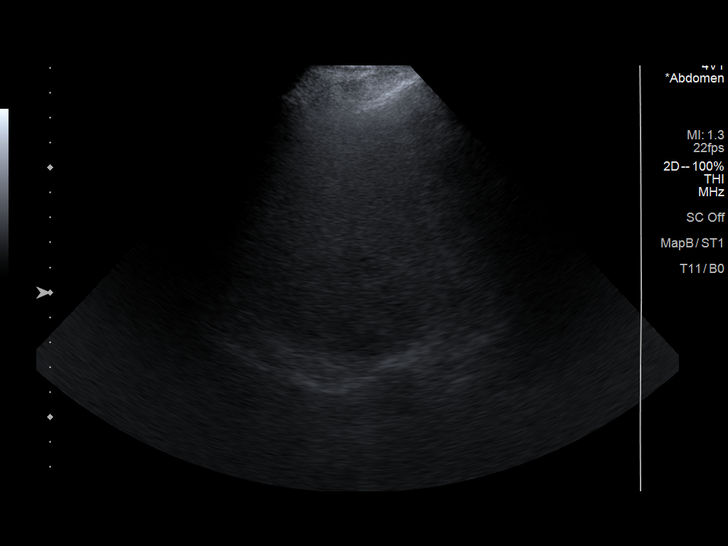
[im 43/57]
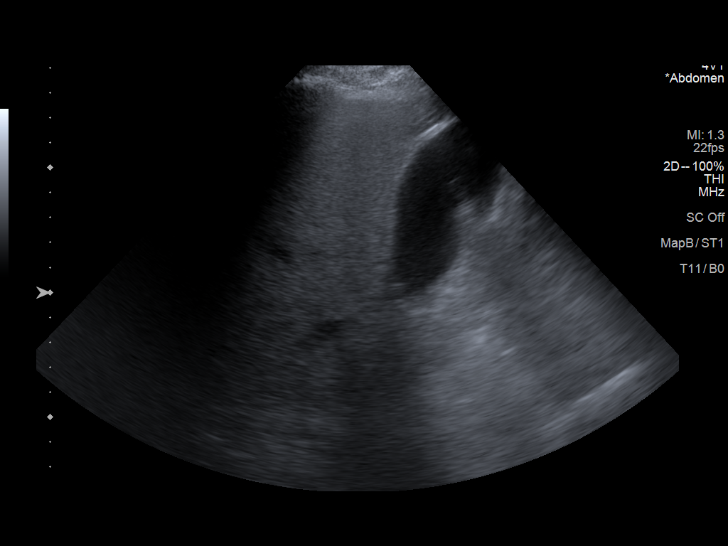
[im 47/57]
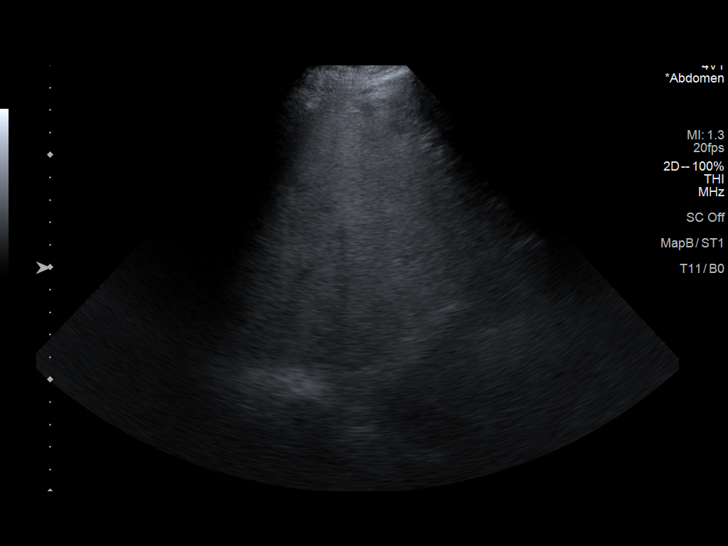
[im 52/57]
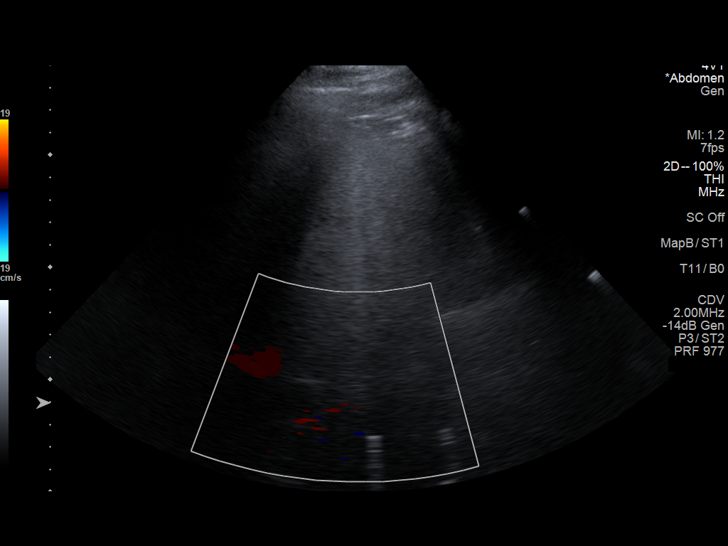
[im 57/57]
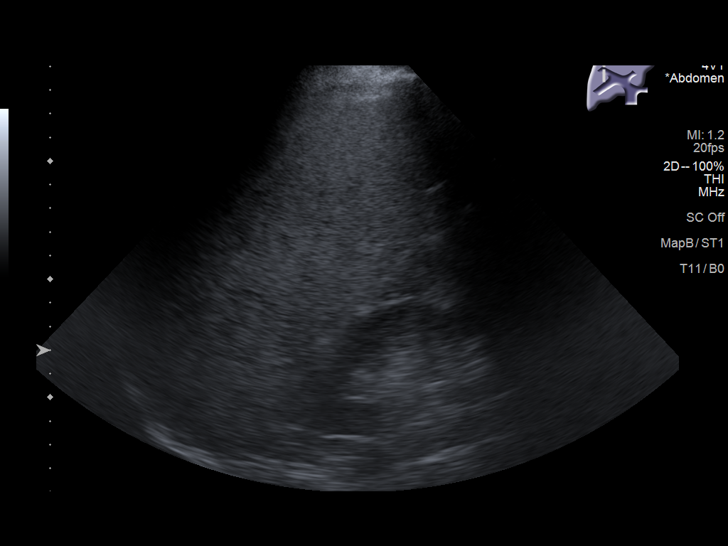

[14 of 25 positions shown; findings below may reference images not displayed]

FINDINGS: Gallbladder:

No gallstones or wall thickening visualized. There is no
pericholecystic fluid. No sonographic Murphy sign noted by
sonographer.

Common bile duct:

Diameter: 3 mm. No intrahepatic or extrahepatic biliary duct
dilatation.

Liver:

No focal lesion identified. Liver echogenicity is increased
diffusely. Portal vein is patent on color Doppler imaging with
normal direction of blood flow towards the liver.

Other: None.
IMPRESSION: 1. Diffuse increase in liver echogenicity, a finding indicative of
hepatic steatosis with potential underlying parenchymal liver
disease. No focal liver lesions are evident; it must be cautioned
that the sensitivity of ultrasound for detection of focal liver
lesions is diminished significantly in this circumstance.

2.  Study otherwise unremarkable.

## 2021-02-03 ENCOUNTER — Ambulatory Visit: Payer: Medicare Other | Admitting: Family Medicine

## 2021-03-05 ENCOUNTER — Telehealth: Payer: Self-pay

## 2021-03-05 NOTE — Telephone Encounter (Signed)
Contacted pt he wanted to verify dose of BP med.  States running low on his machine.  I offered an appt to address.  He states would call back to schedule.

## 2021-03-05 NOTE — Telephone Encounter (Signed)
Copied from [place] [id number]. Topic: General - Other >> Mar 04, 2021  4:31 PM Pawlus, Brayton Layman A wrote: Reason for CRM: Pt had requested a call back to go over his medication questions, please advise.

## 2021-06-26 ENCOUNTER — Ambulatory Visit: Payer: Medicare Other | Admitting: Family Medicine

## 2021-06-30 ENCOUNTER — Encounter: Payer: Self-pay | Admitting: Internal Medicine

## 2021-06-30 ENCOUNTER — Ambulatory Visit (INDEPENDENT_AMBULATORY_CARE_PROVIDER_SITE_OTHER): Payer: 59 | Admitting: Internal Medicine

## 2021-06-30 VITALS — BP 122/74 | HR 78 | Temp 98.2°F | Resp 16 | Ht 72.0 in | Wt 219.7 lb

## 2021-06-30 DIAGNOSIS — Z125 Encounter for screening for malignant neoplasm of prostate: Secondary | ICD-10-CM | POA: Diagnosis not present

## 2021-06-30 DIAGNOSIS — E782 Mixed hyperlipidemia: Secondary | ICD-10-CM

## 2021-06-30 DIAGNOSIS — Z13220 Encounter for screening for lipoid disorders: Secondary | ICD-10-CM | POA: Diagnosis not present

## 2021-06-30 DIAGNOSIS — R7989 Other specified abnormal findings of blood chemistry: Secondary | ICD-10-CM | POA: Diagnosis not present

## 2021-06-30 DIAGNOSIS — I10 Essential (primary) hypertension: Secondary | ICD-10-CM

## 2021-06-30 DIAGNOSIS — Z23 Encounter for immunization: Secondary | ICD-10-CM

## 2021-06-30 DIAGNOSIS — Z8739 Personal history of other diseases of the musculoskeletal system and connective tissue: Secondary | ICD-10-CM

## 2021-06-30 DIAGNOSIS — R7303 Prediabetes: Secondary | ICD-10-CM

## 2021-06-30 DIAGNOSIS — R3914 Feeling of incomplete bladder emptying: Secondary | ICD-10-CM

## 2021-06-30 DIAGNOSIS — Z5181 Encounter for therapeutic drug level monitoring: Secondary | ICD-10-CM

## 2021-06-30 NOTE — Progress Notes (Signed)
Established Patient Office Visit  Subjective:  Patient ID: James Medina, male    DOB: 01-21-1946  Age: 76 y.o. MRN: [id number]  CC:  Chief Complaint  Patient presents with   Follow-up   Hyperlipidemia   Hypertension    HPI ALTUS ZAINO presents for follow up on chronic medical conditions.   Hypertension/OSA: -Medications: Benazepril 20 -Patient is compliant with above medications and reports no side effects. -Checking BP at home (average): 120-134 -Denies any SOB, CP, vision changes, LE edema or symptoms of hypotension  HLD: -Medications: Lipitor 10 taking every other day -  -Patient is compliant with above medications and reports no side effects.  -Last lipid panel: 2/22: TC 174, HDL 80, triglycerides 80, LDL 78  Gout: -Not currently on Allopurinol 100, uses Colchicine as needed -Last flare: over a year ago, only 2 flares in life in hand -Last uric acid: 6.5 7/22  Pre-Diabetes: -Last A1c 5.5 4/21  Elevated LFTs: -on last CMP AST 64, ALT 59  Health Maintenance: -Blood work due -Colon cancer screening: colonoscopy in 3/22 - tubular adenoma with recommendations to repeat in 1 year -Lung caner screening: smoked 20 years, quit about 10 years ago -PNA vaccine due today   Past Medical History:  Diagnosis Date   Alcoholism ([place]) 10/18/2017   ED (erectile dysfunction)    Hyperlipidemia    Hypertension    Impotence    Marijuana use 01/25/2018   OA (osteoarthritis)    hand   Obstructive sleep apnea 07/14/2018   Managed by pulmonologist   Sleep apnea     Past Surgical History:  Procedure Laterality Date   APPENDECTOMY     broken ankle     COLONOSCOPY WITH PROPOFOL N/A 08/27/2020   Procedure: COLONOSCOPY WITH PROPOFOL;  Surgeon: Virgel Manifold, MD;  Location: [facility] ENDOSCOPY;  Service: Endoscopy;  Laterality: N/A;    Family History  Problem Relation Age of Onset   Cancer Mother        leukemia   Heart disease Father    Learning disabilities Maternal  Uncle    Diabetes Brother    Stroke Brother    Diabetes Brother    Cancer Maternal Grandmother    Emphysema Maternal Grandfather    Heart disease Sister        pacemaker   COPD Neg Hx    Hypertension Neg Hx     Social History   Socioeconomic History   Marital status: Married    Spouse name: Not on file   Number of children: 2   Years of education: Not on file   Highest education level: Some college, no degree  Occupational History   Not on file  Tobacco Use   Smoking status: Former    Packs/day: 1.00    Years: 15.00    Pack years: 15.00    Types: Cigars, Cigarettes    Quit date: 04/06/2012    Years since quitting: 9.2   Smokeless tobacco: Current    Types: Chew   Tobacco comments:    small amount per pt  Vaping Use   Vaping Use: Never used  Substance and Sexual Activity   Alcohol use: Yes    Alcohol/week: 3.0 - 4.0 standard drinks    Types: 3 - 4 Cans of beer per week   Drug use: Not Currently   Sexual activity: Yes  Other Topics Concern   Not on file  Social History Narrative   Married, still works as Museum/gallery conservator.  Social Determinants of Health   Financial Resource Strain: Low Risk    Difficulty of Paying Living Expenses: Not hard at all  Food Insecurity: No Food Insecurity   Worried About Charity fundraiser in the Last Year: Never true   [place] in the Last Year: Never true  Transportation Needs: No Transportation Needs   Lack of Transportation (Medical): No   Lack of Transportation (Non-Medical): No  Physical Activity: Insufficiently Active   Days of Exercise per Week: 2 days   Minutes of Exercise per Session: 20 min  Stress: Stress Concern Present   Feeling of Stress : To some extent  Social Connections: Moderately Isolated   Frequency of Communication with Friends and Family: More than three times a week   Frequency of Social Gatherings with Friends and Family: Three times a week   Attends Religious Services: Never   Active  Member of Clubs or Organizations: No   Attends Archivist Meetings: Never   Marital Status: Married  Human resources officer Violence: Not At Risk   Fear of Current or Ex-Partner: No   Emotionally Abused: No   Physically Abused: No   Sexually Abused: No    Outpatient Medications Prior to Visit  Medication Sig Dispense Refill   allopurinol (ZYLOPRIM) 100 MG tablet Take 1 tablet (100 mg total) by mouth daily. 90 tablet 3   atorvastatin (LIPITOR) 10 MG tablet Take 1 tablet (10 mg total) by mouth every other day. In the evening 45 tablet 3   benazepril (LOTENSIN) 20 MG tablet Take 1 tablet (20 mg total) by mouth daily. 30 tablet 11   colchicine 0.6 MG tablet Take two pills at the onset of a gout attack; take one more pill one hour later 3 tablet 5   tadalafil (CIALIS) 20 MG tablet Take 1 tablet (20 mg total) by mouth daily as needed for erectile dysfunction. Take 30 min prior to sexual activity 10 tablet 11   No facility-administered medications prior to visit.    No Known Allergies  ROS Review of Systems  Constitutional:  Negative for chills and fever.  Eyes:  Negative for visual disturbance.  Respiratory:  Negative for cough and shortness of breath.   Cardiovascular:  Negative for chest pain.  Musculoskeletal:  Positive for arthralgias.  Neurological:  Negative for dizziness and headaches.     Objective:    Physical Exam Constitutional:      Appearance: Normal appearance.  HENT:     Head: Normocephalic and atraumatic.  Eyes:     Conjunctiva/sclera: Conjunctivae normal.  Cardiovascular:     Rate and Rhythm: Normal rate and regular rhythm.  Pulmonary:     Effort: Pulmonary effort is normal.     Breath sounds: Normal breath sounds.  Musculoskeletal:     Right lower leg: No edema.     Left lower leg: No edema.  Skin:    General: Skin is warm and dry.  Neurological:     General: No focal deficit present.     Mental Status: He is alert. Mental status is at baseline.   Psychiatric:        Mood and Affect: Mood normal.        Behavior: Behavior normal.    BP 122/74    Pulse 78    Temp 98.2 F (36.8 C)    Resp 16    Ht 6' (1.829 m)    Wt 219 lb 11.2 oz (99.7 kg)    SpO2  97%    BMI 29.80 kg/m  Wt Readings from Last 3 Encounters:  01/06/21 222 lb (100.7 kg)  12/29/20 222 lb 9.6 oz (101 kg)  08/27/20 220 lb (99.8 kg)     Health Maintenance Due  Topic Date Due   Pneumonia Vaccine 84+ Years old (1 - PCV) Never done   COVID-19 Vaccine (4 - Booster for Moderna series) 07/22/2020   INFLUENZA VACCINE  01/19/2021    There are no preventive care reminders to display for this patient.  No results found for: TSH Lab Results  Component Value Date   WBC 10.3 12/29/2020   HGB 17.1 12/29/2020   HCT 48.1 12/29/2020   MCV 98.2 12/29/2020   PLT 245 12/29/2020   Lab Results  Component Value Date   NA 133 (L) 12/29/2020   K 3.9 12/29/2020   CO2 26 12/29/2020   GLUCOSE 103 (H) 12/29/2020   BUN 10 12/29/2020   CREATININE 0.87 12/29/2020   BILITOT 0.8 12/29/2020   ALKPHOS 103 10/17/2017   AST 64 (H) 12/29/2020   ALT 59 (H) 12/29/2020   PROT 7.4 12/29/2020   ALBUMIN 3.5 10/17/2017   CALCIUM 9.2 12/29/2020   ANIONGAP 11 10/17/2017   EGFR 92 12/29/2020   Lab Results  Component Value Date   CHOL 174 08/06/2020   Lab Results  Component Value Date   HDL 80 08/06/2020   Lab Results  Component Value Date   LDLCALC 78 08/06/2020   Lab Results  Component Value Date   TRIG 80 08/06/2020   Lab Results  Component Value Date   CHOLHDL 2.2 08/06/2020   Lab Results  Component Value Date   HGBA1C 5.5 09/20/2019      Assessment & Plan:   1. Essential hypertension/Medication monitoring encounter: Stable, BP at goal, continue ACEI. Follow up in 6 months.   2. Mixed hyperlipidemia/Lipid screening: Stable, reviewed last lipid panel with the patient, continue statin every other day as tolerated. Recheck lipid panel with other labs.   - Lipid  Profile  3. Prediabetes: Stable, recheck A1c. - HgB A1c  4. Elevated LFTs: Mild increase on last CMP, repeat.   - COMPLETE METABOLIC PANEL WITH GFR  5. Vaccine for streptococcus pneumoniae and influenza: Prevnar 20 vaccine administered today.  - Pneumococcal conjugate vaccine 20-valent (Prevnar 20)  6. Prostate cancer screening/Feeling of incomplete bladder emptying: PSA ordered today.   - PSA  7. History of gout: Stable, has colchicine if he needs it, not taking Allopurinol as he's only had 2 flares previously.    Follow-up: Return in about 6 months (around 12/28/2021).    Teodora Medici, DO

## 2021-06-30 NOTE — Patient Instructions (Addendum)
It was great seeing you today!  Plan discussed at today's visit: -Blood work ordered today, results will be uploaded to [facility]. Please obtain blood work whenever you have a chance -Please call GI office to schedule repeat colonoscopy at [facility] at [phone]. -Prevnar 20 vaccine given today for pneumonia   Follow up in: 6 months   Take care and let us know if you have any questions or concerns prior to your next visit.  Dr. Rosana Berger

## 2021-07-11 LAB — LIPID PANEL
Cholesterol: 159 mg/dL (ref <200)
HDL: 84 mg/dL (ref >=40)
LDL Cholesterol (Calc): 63 mg/dL (calc)
Non-HDL Cholesterol (Calc): 75 mg/dL (calc) (ref <130)
Total CHOL/HDL Ratio: 1.9 (calc) (ref <5.0)
Triglycerides: 50 mg/dL (ref <150)

## 2021-07-11 LAB — COMPLETE METABOLIC PANEL WITH GFR
AG Ratio: 1.3 (calc) (ref 1.0–2.5)
ALT: 31 U/L (ref 9–46)
AST: 49 U/L — ABNORMAL HIGH (ref 10–35)
Albumin: 4.2 g/dL (ref 3.6–5.1)
Alkaline phosphatase (APISO): 85 U/L (ref 35–144)
BUN: 7 mg/dL (ref 7–25)
CO2: 26 mmol/L (ref 20–32)
Calcium: 9.4 mg/dL (ref 8.6–10.3)
Chloride: 100 mmol/L (ref 98–110)
Creat: 0.79 mg/dL (ref 0.70–1.28)
Globulin: 3.3 g/dL (calc) (ref 1.9–3.7)
Glucose, Bld: 109 mg/dL — ABNORMAL HIGH (ref 65–99)
Potassium: 4 mmol/L (ref 3.5–5.3)
Sodium: 137 mmol/L (ref 135–146)
Total Bilirubin: 1.2 mg/dL (ref 0.2–1.2)
Total Protein: 7.5 g/dL (ref 6.1–8.1)
eGFR: 94 mL/min/{1.73_m2} (ref >=60)

## 2021-07-11 LAB — HEMOGLOBIN A1C
Hgb A1c MFr Bld: 5.4 % of total Hgb (ref <5.7)
Mean Plasma Glucose: 108 mg/dL
eAG (mmol/L): 6 mmol/L

## 2021-07-11 LAB — PSA: PSA: 1.56 ng/mL (ref <=4.00)

## 2021-10-08 ENCOUNTER — Other Ambulatory Visit: Payer: Self-pay | Admitting: Family Medicine

## 2021-10-08 DIAGNOSIS — N529 Male erectile dysfunction, unspecified: Secondary | ICD-10-CM

## 2021-10-26 DIAGNOSIS — Z20822 Contact with and (suspected) exposure to covid-19: Secondary | ICD-10-CM | POA: Diagnosis not present

## 2021-10-27 ENCOUNTER — Ambulatory Visit: Payer: Medicare Other

## 2021-11-03 ENCOUNTER — Ambulatory Visit (INDEPENDENT_AMBULATORY_CARE_PROVIDER_SITE_OTHER): Payer: 59

## 2021-11-03 DIAGNOSIS — Z0000 Encounter for general adult medical examination without abnormal findings: Secondary | ICD-10-CM | POA: Diagnosis not present

## 2021-11-03 NOTE — Patient Instructions (Signed)
James Medina , ?Thank you for taking time to come for your Medicare Wellness Visit. I appreciate your ongoing commitment to your health goals. Please review the following plan we discussed and let me know if I can assist you in the future.  ? ?Screening recommendations/referrals: ?Colonoscopy: done 08/27/20. Please call [facility] to schedule a repeat screening colonoscopy at [phone] ?Recommended yearly ophthalmology/optometry visit for glaucoma screening and checkup ?Recommended yearly dental visit for hygiene and checkup ? ?Vaccinations: ?Influenza vaccine: done 04/21/21 ?Pneumococcal vaccine: done 06/30/21 ?Tdap vaccine: done 10/17/17 ?Shingles vaccine: done 05/02/19 & 07/21/19   ?Covid-19: done 10/26/19, 11/23/19 & 05/27/20 ? ?Advanced directives: Please bring a copy of your health care power of attorney and living will to the office at your convenience.  ? ?Conditions/risks identified: Keep up the great work! ? ?Next appointment: Follow up in one year for your annual wellness visit.  ? ?Preventive Care 51 Years and Older, Male ?Preventive care refers to lifestyle choices and visits with your health care provider that can promote health and wellness. ?What does preventive care include? ?A yearly physical exam. This is also called an annual well check. ?Dental exams once or twice a year. ?Routine eye exams. Ask your health care provider how often you should have your eyes checked. ?Personal lifestyle choices, including: ?Daily care of your teeth and gums. ?Regular physical activity. ?Eating a healthy diet. ?Avoiding tobacco and drug use. ?Limiting alcohol use. ?Practicing safe sex. ?Taking low doses of aspirin every day. ?Taking vitamin and mineral supplements as recommended by your health care provider. ?What happens during an annual well check? ?The services and screenings done by your health care provider during your annual well check will depend on your age, overall health, lifestyle risk factors, and  family history of disease. ?Counseling  ?Your health care provider may ask you questions about your: ?Alcohol use. ?Tobacco use. ?Drug use. ?Emotional well-being. ?Home and relationship well-being. ?Sexual activity. ?Eating habits. ?History of falls. ?Memory and ability to understand (cognition). ?Work and work Statistician. ?Screening  ?You may have the following tests or measurements: ?Height, weight, and BMI. ?Blood pressure. ?Lipid and cholesterol levels. These may be checked every 5 years, or more frequently if you are over 83 years old. ?Skin check. ?Lung cancer screening. You may have this screening every year starting at age 24 if you have a 30-pack-year history of smoking and currently smoke or have quit within the past 15 years. ?Fecal occult blood test (FOBT) of the stool. You may have this test every year starting at age 34. ?Flexible sigmoidoscopy or colonoscopy. You may have a sigmoidoscopy every 5 years or a colonoscopy every 10 years starting at age 101. ?Prostate cancer screening. Recommendations will vary depending on your family history and other risks. ?Hepatitis C blood test. ?Hepatitis B blood test. ?Sexually transmitted disease (STD) testing. ?Diabetes screening. This is done by checking your blood sugar (glucose) after you have not eaten for a while (fasting). You may have this done every 1-3 years. ?Abdominal aortic aneurysm (AAA) screening. You may need this if you are a current or former smoker. ?Osteoporosis. You may be screened starting at age 37 if you are at high risk. ?Talk with your health care provider about your test results, treatment options, and if necessary, the need for more tests. ?Vaccines  ?Your health care provider may recommend certain vaccines, such as: ?Influenza vaccine. This is recommended every year. ?Tetanus, diphtheria, and acellular pertussis (Tdap, Td) vaccine. You may need a Td booster  every 10 years. ?Zoster vaccine. You may need this after age 36. ?Pneumococcal  13-valent conjugate (PCV13) vaccine. One dose is recommended after age 86. ?Pneumococcal polysaccharide (PPSV23) vaccine. One dose is recommended after age 9. ?Talk to your health care provider about which screenings and vaccines you need and how often you need them. ?This information is not intended to replace advice given to you by your health care provider. Make sure you discuss any questions you have with your health care provider. ?Document Released: 07/04/2015 Document Revised: 02/25/2016 Document Reviewed: 04/08/2015 ?Elsevier Interactive Patient Education ? 2017 [employer]. ? ?Fall Prevention in the Home ?Falls can cause injuries. They can happen to people of all ages. There are many things you can do to make your home safe and to help prevent falls. ?What can I do on the outside of my home? ?Regularly fix the edges of walkways and driveways and fix any cracks. ?Remove anything that might make you trip as you walk through a door, such as a raised step or threshold. ?Trim any bushes or trees on the path to your home. ?Use bright outdoor lighting. ?Clear any walking paths of anything that might make someone trip, such as rocks or tools. ?Regularly check to see if handrails are loose or broken. Make sure that both sides of any steps have handrails. ?Any raised decks and porches should have guardrails on the edges. ?Have any leaves, snow, or ice cleared regularly. ?Use sand or salt on walking paths during winter. ?Clean up any spills in your garage right away. This includes oil or grease spills. ?What can I do in the bathroom? ?Use night lights. ?Install grab bars by the toilet and in the tub and shower. Do not use towel bars as grab bars. ?Use non-skid mats or decals in the tub or shower. ?If you need to sit down in the shower, use a plastic, non-slip stool. ?Keep the floor dry. Clean up any water that spills on the floor as soon as it happens. ?Remove soap buildup in the tub or shower regularly. ?Attach  bath mats securely with double-sided non-slip rug tape. ?Do not have throw rugs and other things on the floor that can make you trip. ?What can I do in the bedroom? ?Use night lights. ?Make sure that you have a light by your bed that is easy to reach. ?Do not use any sheets or blankets that are too big for your bed. They should not hang down onto the floor. ?Have a firm chair that has side arms. You can use this for support while you get dressed. ?Do not have throw rugs and other things on the floor that can make you trip. ?What can I do in the kitchen? ?Clean up any spills right away. ?Avoid walking on wet floors. ?Keep items that you use a lot in easy-to-reach places. ?If you need to reach something above you, use a strong step stool that has a grab bar. ?Keep electrical cords out of the way. ?Do not use floor polish or wax that makes floors slippery. If you must use wax, use non-skid floor wax. ?Do not have throw rugs and other things on the floor that can make you trip. ?What can I do with my stairs? ?Do not leave any items on the stairs. ?Make sure that there are handrails on both sides of the stairs and use them. Fix handrails that are broken or loose. Make sure that handrails are as long as the stairways. ?Check any carpeting to  make sure that it is firmly attached to the stairs. Fix any carpet that is loose or worn. ?Avoid having throw rugs at the top or bottom of the stairs. If you do have throw rugs, attach them to the floor with carpet tape. ?Make sure that you have a light switch at the top of the stairs and the bottom of the stairs. If you do not have them, ask someone to add them for you. ?What else can I do to help prevent falls? ?Wear shoes that: ?Do not have high heels. ?Have rubber bottoms. ?Are comfortable and fit you well. ?Are closed at the toe. Do not wear sandals. ?If you use a stepladder: ?Make sure that it is fully opened. Do not climb a closed stepladder. ?Make sure that both sides of the  stepladder are locked into place. ?Ask someone to hold it for you, if possible. ?Clearly mark and make sure that you can see: ?Any grab bars or handrails. ?First and last steps. ?Where the edge of each ste

## 2021-11-03 NOTE — Progress Notes (Signed)
Subjective:   James Medina is a 76 y.o. male who presents for Medicare Annual/Subsequent preventive examination.  Virtual Visit via Telephone Note  I connected with  James Medina on 11/03/21 at 11:15 AM EDT by telephone and verified that I am speaking with the correct person using two identifiers.  Location: Patient: home Provider: Brooklyn Medina Persons participating in the virtual visit: James Medina   I discussed the limitations, risks, security and privacy concerns of performing an evaluation and management service by telephone and the availability of in person appointments. The patient expressed understanding and agreed to proceed.  Interactive audio and video telecommunications were attempted between this nurse and patient, however failed, due to patient having technical difficulties OR patient did not have access to video capability.  We continued and completed visit with audio only.  Some vital signs may be absent or patient reported.   James Marker, LPN   Review of Systems     Cardiac Risk Factors include: advanced age (>69mn, >>41women);dyslipidemia;male gender;hypertension     Objective:    There were no vitals filed for this visit. There is no height or weight on file to calculate BMI.     11/03/2021   11:20 AM 10/23/2020   10:21 AM 08/27/2020    9:14 AM 09/20/2019    8:42 AM 09/14/2018    2:06 PM 03/31/2018   12:59 PM 01/26/2018    2:37 PM  Advanced Directives  Does Patient Have a Medical Advance Directive? Yes Yes Yes Yes No No No  Type of AParamed[place]of [place] will [place] will Living will;Healthcare Power of [place]ela Bay[place] will     Copy of [place] Chart? No - copy requested No - copy requested No - copy requested No - copy requested     Would patient like information on creating a medical advance directive?     No - Patient declined       Current Medications (verified) Outpatient Encounter Medications as of 11/03/2021  Medication Sig   allopurinol (ZYLOPRIM) 100 MG tablet Take 1 tablet (100 mg total) by mouth daily.   atorvastatin (LIPITOR) 10 MG tablet Take 1 tablet (10 mg total) by mouth every other day. In the evening   benazepril (LOTENSIN) 20 MG tablet Take 1 tablet (20 mg total) by mouth daily.   colchicine 0.6 MG tablet Take two pills at the onset of a gout attack; take one more pill one hour later   tadalafil (CIALIS) 20 MG tablet TAKE 1 TABLET BY MOUTH DAILY AS NEEDED FOR ERECTILE DYSFUNCTION. TAKE 30 MINUTES PRIOR TO SEXUAL ACTIVITY   No facility-administered encounter medications on file as of 11/03/2021.    Allergies (verified) Patient has no known allergies.   History: Past Medical History:  Diagnosis Date   Alcoholism ([place] 10/18/2017   ED (erectile dysfunction)    Hyperlipidemia    Hypertension    Impotence    Marijuana use 01/25/2018   OA (osteoarthritis)    hand   Obstructive sleep apnea 07/14/2018   Managed by pulmonologist   Sleep apnea    Past Surgical History:  Procedure Laterality Date   APPENDECTOMY     broken ankle     COLONOSCOPY WITH PROPOFOL N/A 08/27/2020   Procedure: COLONOSCOPY WITH PROPOFOL;  Surgeon: TVirgel Manifold MD;  Location: [facility] ENDOSCOPY;  Service: Endoscopy;  Laterality: N/A;   Family History  Problem Relation Age of Onset  Cancer Mother        leukemia   Heart disease Father    Learning disabilities Maternal Uncle    Diabetes Brother    Stroke Brother    Diabetes Brother    Cancer Maternal Grandmother    Emphysema Maternal Grandfather    Heart disease Sister        pacemaker   COPD Neg Hx    Hypertension Neg Hx    Social History   Socioeconomic History   Marital status: Widowed    Spouse name: Not on file   Number of children: 2   Years of education: Not on file   Highest education level: Some college, no degree  Occupational History   Not on  file  Tobacco Use   Smoking status: Former    Packs/day: 1.00    Years: 15.00    Pack years: 15.00    Types: Cigars, Cigarettes    Quit date: 04/06/2012    Years since quitting: 9.5   Smokeless tobacco: Current    Types: Chew   Tobacco comments:    small amount per pt  Vaping Use   Vaping Use: Never used  Substance and Sexual Activity   Alcohol use: Yes    Alcohol/week: 3.0 - 4.0 standard drinks    Types: 3 - 4 Cans of beer per week   Drug use: Not Currently   Sexual activity: Yes  Other Topics Concern   Not on file  Social History Narrative   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 still works as Museum/gallery conservator.    Social Determinants of Health   Financial Resource Strain: Low Risk    Difficulty of Paying Living Expenses: Not hard at all  Food Insecurity: No Food Insecurity   Worried About Charity fundraiser in the Last Year: Never true   [place] in the Last Year: Never true  Transportation Needs: No Transportation Needs   Lack of Transportation (Medical): No   Lack of Transportation (Non-Medical): No  Physical Activity: Insufficiently Active   Days of Exercise per Week: 2 days   Minutes of Exercise per Session: 30 min  Stress: No Stress Concern Present   Feeling of Stress : Not at all  Social Connections: Socially Isolated   Frequency of Communication with Friends and Family: More than three times a week   Frequency of Social Gatherings with Friends and Family: Three times a week   Attends Religious Services: Never   Active Member of Clubs or Organizations: No   Attends Archivist Meetings: Never   Marital Status: Widowed    Tobacco Counseling Ready to quit: Not Answered Counseling given: Not Answered Tobacco comments: small amount per pt   Clinical Intake:  Pre-visit preparation completed: Yes  Pain : No/denies pain     Nutritional Risks: None Diabetes: No  How often do you need to have someone help you when you read instructions,  pamphlets, or other written materials from your doctor or pharmacy?: 1 - Never    Interpreter Needed?: No  Information entered by :: James Marker LPN   Activities of Daily Living    11/03/2021   11:23 AM 06/30/2021    8:38 AM  In your present state of health, do you have any difficulty performing the following activities:  Hearing? 0 0  Vision? 0 0  Difficulty concentrating or making decisions? 0 0  Walking or climbing stairs? 0 1  Dressing or bathing? 0 0  Doing  errands, shopping? 0 0  Preparing Food and eating ? N   Using the Toilet? N   In the past six months, have you accidently leaked urine? N   Do you have problems with loss of bowel control? N   Managing your Medications? N   Managing your Finances? N   Housekeeping or managing your Housekeeping? N     Patient Care Team: Delsa Grana, PA-C as PCP - General (Family Medicine)  Indicate any recent Medical Services you may have received from other than [facility] providers in the past year (date may be approximate).     Assessment:   This is a routine wellness examination for Sheena.  Hearing/Vision screen Hearing Screening - Comments:: Pt denies hearing difficulty. Yearly hearing evaluation provided at work. Wears ear plugs all day at work. Vision Screening - Comments:: Vision screening done at [place] issues and exercise activities discussed: Current Exercise Habits: Home exercise routine, Type of exercise: walking;Other - see comments (yard work), Time (Minutes): 30, Frequency (Times/Week): 2, Weekly Exercise (Minutes/Week): 60, Intensity: Moderate, Exercise limited by: None identified   Goals Addressed             This Visit's Progress    Patient Stated   On track    Patient states he plans to continue caring for his wife with recent cancer diagnosis.        Depression Screen    11/03/2021   11:19 AM 06/30/2021    8:37 AM 01/06/2021   10:17 AM 12/29/2020    3:03 PM 10/23/2020   10:19 AM  08/06/2020    9:48 AM 10/24/2019    8:10 AM  PHQ 2/9 Scores  PHQ - 2 Score 1 0 0 0 0 0 0  PHQ- 9 Score  0 0 0  0 0    Fall Risk    11/03/2021   11:22 AM 06/30/2021    8:35 AM 01/06/2021   10:17 AM 12/29/2020    3:03 PM 10/23/2020   10:21 AM  Fall Risk   Falls in the past year? 0 0 0 0 0  Number falls in past yr: 0 0 0 0 0  Injury with Fall? 0 0 0 0 0  Risk for fall due to : No Fall Risks    No Fall Risks  Follow up Falls prevention discussed    Falls prevention discussed    FALL RISK PREVENTION PERTAINING TO THE HOME:  Any stairs in or around the home? Yes  If so, are there any without handrails? No  Home free of loose throw rugs in walkways, pet beds, electrical cords, etc? Yes  Adequate lighting in your home to reduce risk of falls? Yes   ASSISTIVE DEVICES UTILIZED TO PREVENT FALLS:  Life alert? No  Use of a cane, walker or w/c? No  Grab bars in the bathroom? No  Shower chair or bench in shower? No  Elevated toilet seat or a handicapped toilet? Yes   TIMED UP AND GO:  Was the test performed? No . Telephonic visit.   Cognitive Function: Normal cognitive status assessed by direct observation by this Nurse Health Advisor. No abnormalities found.          09/20/2019    8:37 AM 09/14/2018    2:13 PM  6CIT Screen  What Year? 0 points 0 points  What month? 0 points 0 points  What time? 0 points 0 points  Count back from 20 0 points 0 points  Months in reverse 0 points 0 points  Repeat phrase 2 points 0 points  Total Score 2 points 0 points    Immunizations Immunization History  Administered Date(s) Administered   Fluad Quad(high Dose 65+) 03/22/2019, 03/13/2020   Influenza, High Dose Seasonal PF 03/25/2017   Influenza-Unspecified 04/21/2021   Moderna Sars-Covid-2 Vaccination 10/26/2019, 11/23/2019, 05/27/2020   PNEUMOCOCCAL CONJUGATE-20 06/30/2021   Tdap 06/21/2009, 10/17/2017   Zoster Recombinat (Shingrix) 05/02/2019, 07/21/2019    TDAP status: Up to  date  Flu Vaccine status: Up to date  Pneumococcal vaccine status: Up to date  Covid-19 vaccine status: Completed vaccines  Qualifies for Shingles Vaccine? Yes   Zostavax completed No   Shingrix Completed?: Yes  Screening Tests Health Maintenance  Topic Date Due   COVID-19 Vaccine (4 - Booster for Moderna series) 07/22/2020   COLONOSCOPY (Pts 45-48yr Insurance coverage will need to be confirmed)  08/27/2021   INFLUENZA VACCINE  01/19/2022   TETANUS/TDAP  10/18/2027   Pneumonia Vaccine 76 Years old  Completed   Hepatitis C Screening  Completed   Zoster Vaccines- Shingrix  Completed   HPV VACCINES  Aged Out    Health Maintenance  Health Maintenance Due  Topic Date Due   COVID-19 Vaccine (4 - Booster for Moderna series) 07/22/2020   COLONOSCOPY (Pts 45-486yrInsurance coverage will need to be confirmed)  08/27/2021    Colorectal cancer screening: Type of screening: Colonoscopy. Completed 08/27/20. Repeat every 1 years. Pt aware due for repeat screening; states he will need to schedule when he has someone available to take him for procedure.   Lung Cancer Screening: (Low Dose CT Chest recommended if Age 74-80ears, 30 pack-year currently smoking OR have quit w/in 15years.) does not qualify.   Additional Screening:  Hepatitis C Screening: does qualify; Completed 09/05/15  Vision Screening: Recommended annual ophthalmology exams for early detection of glaucoma and other disorders of the eye. Is the patient up to date with their annual eye exam?  Yes  Who is the provider or what is the name of the office in which the patient attends annual [facility] exams[facility]  Dental Screening: Recommended annual dental exams for proper oral hygiene  Community Resource Referral / Chronic Care Management: CRR required this visit?  No   CCM required this visit?  No      Plan:     I have personally reviewed and noted the following in the patient's chart:   Medical and  social history Use of alcohol, tobacco or illicit drugs  Current medications and supplements including opioid prescriptions. Patient is not currently taking opioid prescriptions. Functional ability and status Nutritional status Physical activity Advanced directives List of other physicians Hospitalizations, surgeries, and ER visits in previous 12 months Vitals Screenings to include cognitive, depression, and falls Referrals and appointments  In addition, I have reviewed and discussed with patient certain preventive protocols, quality metrics, and best practice recommendations. A written personalized care plan for preventive services as well as general preventive health recommendations were provided to patient.     KaClemetine MarkerLPN   10/19/00/7253 Nurse Notes: none

## 2021-12-30 NOTE — Progress Notes (Unsigned)
Established Patient Office Visit  Subjective:  Patient ID: James Medina, male    DOB: [birth year]  Age: 76 y.o. MRN: [id number]  CC:  No chief complaint on file.   HPI James Medina presents for follow up on chronic medical conditions.   Hypertension/OSA: -Medications: Benazepril 20 -Patient is compliant with above medications and reports no side effects. -Checking BP at home (average): 120-134 -Denies any SOB, CP, vision changes, LE edema or symptoms of hypotension  HLD: -Medications: Lipitor 10 taking every other day -  -Patient is compliant with above medications and reports no side effects.  -Last lipid panel: Lipid Panel     Component Value Date/Time   CHOL 159 07/10/2021 0821   CHOL 187 09/05/2015 0845   TRIG 50 07/10/2021 0821   HDL 84 07/10/2021 0821   HDL 88 09/05/2015 0845   CHOLHDL 1.9 07/10/2021 0821   VLDL 16 04/26/2016 1007   LDLCALC 63 07/10/2021 0821   LABVLDL 26 09/05/2015 0845   The 10-year ASCVD risk score (Arnett DK, et al., 2019) is: 17.8%   Values used to calculate the score:     Age: 39 years     Sex: Male     Is Non-Hispanic African American: No     Diabetic: No     Tobacco smoker: No     Systolic Blood Pressure: 616 mmHg     Is BP treated: Yes     HDL Cholesterol: 84 mg/dL     Total Cholesterol: 159 mg/dL  Gout: -Not currently on Allopurinol 100, uses Colchicine as needed -Last flare: over a year ago, only 2 flares in life in hand -Last uric acid: 6.5 7/22  Pre-Diabetes: -Last A1c 5.4 1/23  Elevated LFTs: -Last CMP in 1/23 showing AST elevated to 49, ALT normal   Health Maintenance: -Blood work up-to-date -Colon cancer screening: colonoscopy in 3/22 - tubular adenoma with recommendations to repeat in 1 year -due -Lung caner screening: smoked 20 years, quit about 10 years ago   Past Medical History:  Diagnosis Date   Alcoholism ([place]) 10/18/2017   ED (erectile dysfunction)    Hyperlipidemia    Hypertension    Impotence     Marijuana use 01/25/2018   OA (osteoarthritis)    hand   Obstructive sleep apnea 07/14/2018   Managed by pulmonologist   Sleep apnea     Past Surgical History:  Procedure Laterality Date   APPENDECTOMY     broken ankle     COLONOSCOPY WITH PROPOFOL N/A 08/27/2020   Procedure: COLONOSCOPY WITH PROPOFOL;  Surgeon: Virgel Manifold, MD;  Location: [facility] ENDOSCOPY;  Service: Endoscopy;  Laterality: N/A;    Family History  Problem Relation Age of Onset   Cancer Mother        leukemia   Heart disease Father    Learning disabilities Maternal Uncle    Diabetes Brother    Stroke Brother    Diabetes Brother    Cancer Maternal Grandmother    Emphysema Maternal Grandfather    Heart disease Sister        pacemaker   COPD Neg Hx    Hypertension Neg Hx     Social History   Socioeconomic History   Marital status: Widowed    Spouse name: Not on file   Number of children: 2   Years of education: Not on file   Highest education level: Some college, no degree  Occupational History   Not on file  Tobacco Use  Smoking status: Former    Packs/day: 1.00    Years: 15.00    Total pack years: 15.00    Types: Cigars, Cigarettes    Quit date: 04/06/2012    Years since quitting: 9.7   Smokeless tobacco: Current    Types: Chew   Tobacco comments:    small amount per pt  Vaping Use   Vaping Use: Never used  Substance and Sexual Activity   Alcohol use: Yes    Alcohol/week: 3.0 - 4.0 standard drinks of alcohol    Types: 3 - 4 Cans of beer per week   Drug use: Not Currently   Sexual activity: Yes  Other Topics Concern   Not on file  Social History Narrative   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 still works as Museum/gallery conservator.    Social Determinants of Health   Financial Resource Strain: Low Risk  (11/03/2021)   Overall Financial Resource Strain (CARDIA)    Difficulty of Paying Living Expenses: Not hard at all  Food Insecurity: No Food Insecurity (11/03/2021)   Hunger Vital Sign     Worried About Running Out of Food in the Last Year: Never true    Ran Out of Food in the Last Year: Never true  Transportation Needs: No Transportation Needs (11/03/2021)   PRAPARE - Hydrologist (Medical): No    Lack of Transportation (Non-Medical): No  Physical Activity: Insufficiently Active (11/03/2021)   Exercise Vital Sign    Days of Exercise per Week: 2 days    Minutes of Exercise per Session: 30 min  Stress: No Stress Concern Present (11/03/2021)   [place]    Feeling of Stress : Not at all  Social Connections: Socially Isolated (11/03/2021)   Social Connection and Isolation Panel [NHANES]    Frequency of Communication with Friends and Family: More than three times a week    Frequency of Social Gatherings with Friends and Family: Three times a week    Attends Religious Services: Never    Active Member of Clubs or Organizations: No    Attends Archivist Meetings: Never    Marital Status: Widowed  Intimate Partner Violence: Not At Risk (11/03/2021)   Humiliation, Afraid, Rape, and Kick questionnaire    Fear of Current or Ex-Partner: No    Emotionally Abused: No    Physically Abused: No    Sexually Abused: No    Outpatient Medications Prior to Visit  Medication Sig Dispense Refill   allopurinol (ZYLOPRIM) 100 MG tablet Take 1 tablet (100 mg total) by mouth daily. 90 tablet 3   atorvastatin (LIPITOR) 10 MG tablet Take 1 tablet (10 mg total) by mouth every other day. In the evening 45 tablet 3   benazepril (LOTENSIN) 20 MG tablet Take 1 tablet (20 mg total) by mouth daily. 30 tablet 11   colchicine 0.6 MG tablet Take two pills at the onset of a gout attack; take one more pill one hour later 3 tablet 5   tadalafil (CIALIS) 20 MG tablet TAKE 1 TABLET BY MOUTH DAILY AS NEEDED FOR ERECTILE DYSFUNCTION. TAKE 30 MINUTES PRIOR TO SEXUAL ACTIVITY 10 tablet 3   No  facility-administered medications prior to visit.    No Known Allergies  ROS Review of Systems  Constitutional:  Negative for chills and fever.  Eyes:  Negative for visual disturbance.  Respiratory:  Negative for cough and shortness of breath.   Cardiovascular:  Negative for chest pain.  Musculoskeletal:  Positive for arthralgias.  Neurological:  Negative for dizziness and headaches.      Objective:    Physical Exam Constitutional:      Appearance: Normal appearance.  HENT:     Head: Normocephalic and atraumatic.  Eyes:     Conjunctiva/sclera: Conjunctivae normal.  Cardiovascular:     Rate and Rhythm: Normal rate and regular rhythm.  Pulmonary:     Effort: Pulmonary effort is normal.     Breath sounds: Normal breath sounds.  Musculoskeletal:     Right lower leg: No edema.     Left lower leg: No edema.  Skin:    General: Skin is warm and dry.  Neurological:     General: No focal deficit present.     Mental Status: He is alert. Mental status is at baseline.  Psychiatric:        Mood and Affect: Mood normal.        Behavior: Behavior normal.     There were no vitals taken for this visit. Wt Readings from Last 3 Encounters:  06/30/21 219 lb 11.2 oz (99.7 kg)  01/06/21 222 lb (100.7 kg)  12/29/20 222 lb 9.6 oz (101 kg)     Health Maintenance Due  Topic Date Due   COVID-19 Vaccine (4 - Moderna series) 07/22/2020   COLONOSCOPY (Pts 45-18yr Insurance coverage will need to be confirmed)  08/27/2021    There are no preventive care reminders to display for this patient.  No results found for: "TSH" Lab Results  Component Value Date   WBC 10.3 12/29/2020   HGB 17.1 12/29/2020   HCT 48.1 12/29/2020   MCV 98.2 12/29/2020   PLT 245 12/29/2020   Lab Results  Component Value Date   NA 137 07/10/2021   K 4.0 07/10/2021   CO2 26 07/10/2021   GLUCOSE 109 (H) 07/10/2021   BUN 7 07/10/2021   CREATININE 0.79 07/10/2021   BILITOT 1.2 07/10/2021   ALKPHOS 103  10/17/2017   AST 49 (H) 07/10/2021   ALT 31 07/10/2021   PROT 7.5 07/10/2021   ALBUMIN 3.5 10/17/2017   CALCIUM 9.4 07/10/2021   ANIONGAP 11 10/17/2017   EGFR 94 07/10/2021   Lab Results  Component Value Date   CHOL 159 07/10/2021   Lab Results  Component Value Date   HDL 84 07/10/2021   Lab Results  Component Value Date   LDLCALC 63 07/10/2021   Lab Results  Component Value Date   TRIG 50 07/10/2021   Lab Results  Component Value Date   CHOLHDL 1.9 07/10/2021   Lab Results  Component Value Date   HGBA1C 5.4 07/10/2021      Assessment & Plan:   1. Essential hypertension/Medication monitoring encounter: Stable, BP at goal, continue ACEI. Follow up in 6 months.   2. Mixed hyperlipidemia/Lipid screening: Stable, reviewed last lipid panel with the patient, continue statin every other day as tolerated. Recheck lipid panel with other labs.   - Lipid Profile  3. Prediabetes: Stable, recheck A1c. - HgB A1c  4. Elevated LFTs: Mild increase on last CMP, repeat.   - COMPLETE METABOLIC PANEL WITH GFR  5. Vaccine for streptococcus pneumoniae and influenza: Prevnar 20 vaccine administered today.  - Pneumococcal conjugate vaccine 20-valent (Prevnar 20)  6. Prostate cancer screening/Feeling of incomplete bladder emptying: PSA ordered today.   - PSA  7. History of gout: Stable, has colchicine if he needs it, not taking Allopurinol as he's only  had 2 flares previously.    Follow-up: No follow-ups on file.    Teodora Medici, DO

## 2021-12-31 ENCOUNTER — Ambulatory Visit (INDEPENDENT_AMBULATORY_CARE_PROVIDER_SITE_OTHER): Payer: 59 | Admitting: Internal Medicine

## 2021-12-31 ENCOUNTER — Encounter: Payer: Self-pay | Admitting: Internal Medicine

## 2021-12-31 VITALS — BP 138/80 | HR 90 | Temp 98.4°F | Resp 16 | Ht 72.0 in | Wt 217.5 lb

## 2021-12-31 DIAGNOSIS — E782 Mixed hyperlipidemia: Secondary | ICD-10-CM

## 2021-12-31 DIAGNOSIS — I10 Essential (primary) hypertension: Secondary | ICD-10-CM

## 2021-12-31 DIAGNOSIS — Z8739 Personal history of other diseases of the musculoskeletal system and connective tissue: Secondary | ICD-10-CM | POA: Insufficient documentation

## 2021-12-31 DIAGNOSIS — R7989 Other specified abnormal findings of blood chemistry: Secondary | ICD-10-CM | POA: Diagnosis not present

## 2021-12-31 MED ORDER — COLCHICINE 0.6 MG PO TABS: ORAL_TABLET | ORAL | 5 refills | Status: DC

## 2021-12-31 MED ORDER — BENAZEPRIL HCL 20 MG PO TABS: 20.0000 mg | ORAL_TABLET | Freq: Every day | ORAL | 1 refills | Status: DC

## 2021-12-31 NOTE — Assessment & Plan Note (Signed)
Chronic, LFTs in 1/23 showing AST 49, ALT normal. Discussed decreasing Tylenol products and alcohol, plan to recheck at follow up.

## 2021-12-31 NOTE — Assessment & Plan Note (Signed)
Patient stopped statin due to cramps in hand; last lipid panel from January 2023 with LDL of 63. Will continue to hold for now, plan to recheck fasting lipid panel at follow up .

## 2021-12-31 NOTE — Assessment & Plan Note (Deleted)
Discontinue Allopurinol as he was taking it as needed, which can exacerbate flares. Continue Colchicine as needed, refills sent.

## 2021-12-31 NOTE — Patient Instructions (Addendum)
It was great seeing you today!  Plan discussed at today's visit: -Continue medications, refills sent to pharmacy -Amlodipine discontinued -Continue to check blood pressure at home, if >140/90 average please let me know so we can change your medicine -Let me know if you need anything to schedule your colonoscopy -Next time we will plan for labs, please be fasting   Follow up in: 6 months   Take care and let us know if you have any questions or concerns prior to your next visit.  Dr. Rosana Berger

## 2021-12-31 NOTE — Assessment & Plan Note (Signed)
Discontinue Allopurinol as he was taking it as needed, which can exacerbate flares. Continue Colchicine as needed, refills sent.

## 2021-12-31 NOTE — Assessment & Plan Note (Signed)
Blood pressure initially high here, repeat borderline. Continue Benazepril 20 mg, refilled today. Patient will start checking blood pressure more frequently at home and let me know if BP consistently >140/90 so we can add a new medication. Had been on Benazepril 40 mg previously but made blood pressure too low causing symptoms. Follow up in 6 months.

## 2022-03-16 ENCOUNTER — Telehealth: Payer: Self-pay | Admitting: Family Medicine

## 2022-03-16 NOTE — Telephone Encounter (Unsigned)
Tried to call pt to schedule him an appt. No answr and no option for VM     Copied from [place] [id number]. Topic: Referral - Request for Referral >> Mar 16, 2022 12:26 PM Erskine Squibb wrote: Has patient seen PCP for this complaint? Yes.   Referral for which specialty: Podiatry Preferred provider/office: Podiatrist that is close to him in [place] Reason for referral: Middle Toe on Right foot

## 2022-03-16 NOTE — Telephone Encounter (Signed)
Pt was seen in July with Dr.Andrews for gout. FYI

## 2022-03-17 NOTE — Telephone Encounter (Signed)
Pt requesting call back from missed call [phone]

## 2022-03-17 NOTE — Telephone Encounter (Signed)
Pt is scheduled °

## 2022-03-18 ENCOUNTER — Ambulatory Visit (INDEPENDENT_AMBULATORY_CARE_PROVIDER_SITE_OTHER): Payer: 59 | Admitting: Family Medicine

## 2022-03-18 ENCOUNTER — Encounter: Payer: Self-pay | Admitting: Family Medicine

## 2022-03-18 VITALS — BP 160/84 | HR 90 | Temp 98.5°F | Resp 16 | Ht 72.0 in | Wt 225.8 lb

## 2022-03-18 DIAGNOSIS — Z760 Encounter for issue of repeat prescription: Secondary | ICD-10-CM

## 2022-03-18 DIAGNOSIS — Z23 Encounter for immunization: Secondary | ICD-10-CM

## 2022-03-18 DIAGNOSIS — M7989 Other specified soft tissue disorders: Secondary | ICD-10-CM | POA: Diagnosis not present

## 2022-03-18 DIAGNOSIS — M79674 Pain in right toe(s): Secondary | ICD-10-CM

## 2022-03-18 DIAGNOSIS — E782 Mixed hyperlipidemia: Secondary | ICD-10-CM

## 2022-03-18 DIAGNOSIS — L84 Corns and callosities: Secondary | ICD-10-CM | POA: Diagnosis not present

## 2022-03-18 MED ORDER — SULFAMETHOXAZOLE-TRIMETHOPRIM 800-160 MG PO TABS: 1.0000 | ORAL_TABLET | Freq: Two times a day (BID) | ORAL | 0 refills | Status: AC

## 2022-03-18 MED ORDER — ATORVASTATIN CALCIUM 10 MG PO TABS: 10.0000 mg | ORAL_TABLET | ORAL | 3 refills | Status: DC

## 2022-03-18 MED ORDER — CEPHALEXIN 500 MG PO CAPS: 500.0000 mg | ORAL_CAPSULE | Freq: Two times a day (BID) | ORAL | 0 refills | Status: AC

## 2022-03-18 MED ORDER — SILDENAFIL CITRATE 25 MG PO TABS: 25.0000 mg | ORAL_TABLET | Freq: Every day | ORAL | 0 refills | Status: DC | PRN

## 2022-03-18 NOTE — Patient Instructions (Addendum)
Try to do some soaks with warm soapy water and hibiclens or other antiseptic soaps  Let me know if you do not get into podiatry right away or if its getting worse   [facility] -  [place] [address], [place] [address]  Phone: [phone] Fax: [phone]  Monday - Thursday 8:00 a.m. - 5:00 p.m.  Friday 7:30 a.m. - 4:00 p.m.

## 2022-03-18 NOTE — Progress Notes (Signed)
Patient ID: James Medina, male    DOB: [birth year], 76 y.o.   MRN: [id number]  PCP: Delsa Grana, PA-C  Chief Complaint  Patient presents with   Referral    Pt requesting referral to Podiatry, right foot pain on middle toe     Subjective:   James Medina is a 76 y.o. male, presents to clinic with CC of the following:  HPI   Patient presents for right middle toe wound/redness swelling pain for month and a half  He has a callus at the end of his toe, he states it has been this swollen and red for the entire time, pain is so bothersome that he cannot put weight on the toe and walk normally.  He has seen some scant drainage here and there but nothing that appeared purulent.  He does not have a history of diabetes does have history of high cholesterol and he has had evaluation by vascular specialist before but no documented severe PAD or venous insufficiency.  No prior wounds.  He denies any injury or known foreign body. He was walking on the beach last week in [place] and the sand did seem to connect remove some of the callus and it seemed a little bit better.  At first he thought it was gout since he has had gout before in his hands and fingers, he requested a referral to podiatrist for gout however this was not documented previously so he was asked to come in today.  He has not been seen for this in the last month and a half, he has not had any antibiotics prescribed, has not tried to remove the callus on his own or do any soaks at home.    Patient Active Problem List   Diagnosis Date Noted   History of gout 12/31/2021   Elevated LFTs 10/24/2019   Prediabetes 08/03/2019   Obstructive sleep apnea 07/14/2018   Pulmonary nodule 07/14/2018   Osteoarthritis of knee 04/04/2018   Thoracic aortic aneurysm without rupture ([place]) 01/25/2018   Alcohol dependence, daily use ([place]) 10/18/2017   Osteoarthritis of both hands 10/18/2017   Gout 03/25/2017   Elevated hemoglobin ([place]) 00/76/2263    Umbilical hernia without obstruction or gangrene 04/26/2016   Impaired fasting glucose 09/16/2015   Macrocytosis without anemia 09/16/2015   Hematospermia 09/05/2015   Abnormal CBC 06/08/2015   Mixed hyperlipidemia    Essential hypertension    ED (erectile dysfunction)    Impotence       Current Outpatient Medications:    atorvastatin (LIPITOR) 10 MG tablet, Take 1 tablet (10 mg total) by mouth every other day. In the evening, Disp: 45 tablet, Rfl: 3   benazepril (LOTENSIN) 20 MG tablet, Take 1 tablet (20 mg total) by mouth daily., Disp: 90 tablet, Rfl: 1   colchicine 0.6 MG tablet, Take two pills at the onset of a gout attack; take one more pill one hour later, Disp: 3 tablet, Rfl: 5   tadalafil (CIALIS) 20 MG tablet, TAKE 1 TABLET BY MOUTH DAILY AS NEEDED FOR ERECTILE DYSFUNCTION. TAKE 30 MINUTES PRIOR TO SEXUAL ACTIVITY, Disp: 10 tablet, Rfl: 3   No Known Allergies   Social History   Tobacco Use   Smoking status: Former    Packs/day: 1.00    Years: 15.00    Total pack years: 15.00    Types: Cigars, Cigarettes    Quit date: 04/06/2012    Years since quitting: 9.9   Smokeless tobacco: Current  Types: Chew   Tobacco comments:    small amount per pt  Vaping Use   Vaping Use: Never used  Substance Use Topics   Alcohol use: Yes    Alcohol/week: 3.0 - 4.0 standard drinks of alcohol    Types: 3 - 4 Cans of beer per week   Drug use: Not Currently      Chart Review Today: I personally reviewed active problem list, medication list, allergies, family history, social history, health maintenance, notes from last encounter, lab results, imaging with the patient/caregiver today.   Review of Systems  Constitutional: Negative.   HENT: Negative.    Eyes: Negative.   Respiratory: Negative.    Cardiovascular: Negative.   Gastrointestinal: Negative.   Endocrine: Negative.   Genitourinary: Negative.   Musculoskeletal: Negative.   Skin: Negative.   Allergic/Immunologic:  Negative.   Neurological: Negative.   Hematological: Negative.   Psychiatric/Behavioral: Negative.    All other systems reviewed and are negative.      Objective:   Vitals:   03/18/22 1413 03/18/22 1425  BP: (!) 162/84 (!) 160/84  Pulse: 90   Resp: 16   Temp: 98.5 F (36.9 C)   TempSrc: Oral   SpO2: 95%   Weight: 225 lb 12.8 oz (102.4 kg)   Height: 6' (1.829 m)     Body mass index is 30.62 kg/m.  Physical Exam Vitals and nursing note reviewed.  Constitutional:      General: He is not in acute distress.    Appearance: Normal appearance. He is well-developed. He is obese. He is not ill-appearing, toxic-appearing or diaphoretic.  HENT:     Head: Normocephalic and atraumatic.     Nose: Nose normal.  Eyes:     General:        Right eye: No discharge.        Left eye: No discharge.     Conjunctiva/sclera: Conjunctivae normal.  Neck:     Trachea: No tracheal deviation.  Cardiovascular:     Rate and Rhythm: Normal rate and regular rhythm.     Pulses:          Radial pulses are 2+ on the right side and 2+ on the left side.       Dorsalis pedis pulses are 2+ on the right side and 2+ on the left side.       Posterior tibial pulses are 1+ on the right side and 1+ on the left side.     Heart sounds: Normal heart sounds.     Comments: Pedal/ankle edema b/l making PT pulses slightly difficult to palpate Pulmonary:     Effort: Pulmonary effort is normal. No respiratory distress.     Breath sounds: No stridor.  Musculoskeletal:        General: Swelling present.     Right lower leg: Edema present.     Left lower leg: Edema present.  Feet:     Right foot:     Skin integrity: Erythema and callus present.     Toenail Condition: Right toenails are normal.     Comments: Right middle toe swollen, erythematous, with possible erythema noted going up foot All toes warm with normal brisk cap refill Thick round callus at distal right middle toe as seen in picture, no purulent  drainage, entire toe is ttp Skin:    General: Skin is warm and dry.     Capillary Refill: Capillary refill takes less than 2 seconds.     Findings: Erythema  present. No rash.  Neurological:     Mental Status: He is alert. Mental status is at baseline.     Motor: No abnormal muscle tone.     Coordination: Coordination normal.  Psychiatric:        Behavior: Behavior normal.           Results for orders placed or performed in visit on 06/30/21  COMPLETE METABOLIC PANEL WITH GFR  Result Value Ref Range   Glucose, Bld 109 (H) 65 - 99 mg/dL   BUN 7 7 - 25 mg/dL   Creat 0.79 0.70 - 1.28 mg/dL   eGFR 94 > OR = 60 mL/min/1.40m   BUN/Creatinine Ratio NOT APPLICABLE 6 - 22 (calc)   Sodium 137 135 - 146 mmol/L   Potassium 4.0 3.5 - 5.3 mmol/L   Chloride 100 98 - 110 mmol/L   CO2 26 20 - 32 mmol/L   Calcium 9.4 8.6 - 10.3 mg/dL   Total Protein 7.5 6.1 - 8.1 g/dL   Albumin 4.2 3.6 - 5.1 g/dL   Globulin 3.3 1.9 - 3.7 g/dL (calc)   AG Ratio 1.3 1.0 - 2.5 (calc)   Total Bilirubin 1.2 0.2 - 1.2 mg/dL   Alkaline phosphatase (APISO) 85 35 - 144 U/L   AST 49 (H) 10 - 35 U/L   ALT 31 9 - 46 U/L  Lipid Profile  Result Value Ref Range   Cholesterol 159 <200 mg/dL   HDL 84 > OR = 40 mg/dL   Triglycerides 50 <150 mg/dL   LDL Cholesterol (Calc) 63 mg/dL (calc)   Total CHOL/HDL Ratio 1.9 <5.0 (calc)   Non-HDL Cholesterol (Calc) 75 <130 mg/dL (calc)  HgB A1c  Result Value Ref Range   Hgb A1c MFr Bld 5.4 <5.7 % of total Hgb   Mean Plasma Glucose 108 mg/dL   eAG (mmol/L) 6.0 mmol/L  PSA  Result Value Ref Range   PSA 1.56 < OR = 4.00 ng/mL       Assessment & Plan:   Patient presents with a month of toe callus with fairly moderate to severe pain, redness and swelling he reports that it has not changed much in the past month and a half, and he is presented today requesting a podiatry referral.  I am concerned for infection, placing him on double coverage antibiotics which may need to be  changed Urgent referral placed to podiatry be helpful for them to evaluate and possibly work on the callus If there is delay getting into podiatry or no improvement with antibiotics he was to get the x-ray done and follow-up with me He has no history of diabetes or severe known peripheral artery disease, so hopefully this is all just secondary to this callus. He had good pedal pulses and good capillary refill, he also had no constitutional symptoms of concern He was in the information for contacting [facility] in [place]   ICD-10-CM   1. Pain and swelling of toe of right foot  M79.674 DG Foot Complete Right   M79.89 Ambulatory referral to Podiatry    sulfamethoxazole-trimethoprim (BACTRIM DS) 800-160 MG tablet    cephALEXin (KEFLEX) 500 MG capsule   double abx coverage for mild-moderate infection, if not improving may need change of meds to cover pseudomonas/anaerobic    2. Callus of toe  L84 DG Foot Complete Right    Ambulatory referral to Podiatry    3. Need for influenza vaccination  Z23 Flu Vaccine QUAD High Dose([facility])  4. Medication refill  Z76.0 atorvastatin (LIPITOR) 10 MG tablet    sildenafil (VIAGRA) 25 MG tablet     His blood pressure was elevated today -though I suspect infection and he is in considerable pain we will recheck later encouraged him to monitor at home He is asymptomatic Meds were refilled per his request   Delsa Grana, PA-C 03/18/22 2:34 PM

## 2022-03-22 ENCOUNTER — Ambulatory Visit (INDEPENDENT_AMBULATORY_CARE_PROVIDER_SITE_OTHER): Payer: 59

## 2022-03-22 ENCOUNTER — Encounter: Payer: Self-pay | Admitting: Podiatry

## 2022-03-22 ENCOUNTER — Ambulatory Visit (INDEPENDENT_AMBULATORY_CARE_PROVIDER_SITE_OTHER): Payer: 59 | Admitting: Podiatry

## 2022-03-22 DIAGNOSIS — M779 Enthesopathy, unspecified: Secondary | ICD-10-CM

## 2022-03-22 DIAGNOSIS — L84 Corns and callosities: Secondary | ICD-10-CM | POA: Diagnosis not present

## 2022-03-22 DIAGNOSIS — M2041 Other hammer toe(s) (acquired), right foot: Secondary | ICD-10-CM

## 2022-03-22 DIAGNOSIS — M7751 Other enthesopathy of right foot: Secondary | ICD-10-CM

## 2022-03-22 NOTE — Progress Notes (Signed)
Subjective:   Patient ID: James Medina, male   DOB: 76 y.o.   MRN: [id number]   HPI Patient presents stating had a lot of swelling in the right third toe was on an antibiotic which seems to help that but the toe is still very sore and he has a lesion at the end of the toe which has been there a long time but is worsened over the last month.  Patient does not smoke likes to be active   Review of Systems  All other systems reviewed and are negative.       Objective:  Physical Exam Vitals and nursing note reviewed.  Constitutional:      Appearance: He is well-developed.  Pulmonary:     Effort: Pulmonary effort is normal.  Musculoskeletal:        General: Normal range of motion.  Skin:    General: Skin is warm.  Neurological:     Mental Status: He is alert.     Neurovascular status intact muscle strength was found to be adequate range of motion adequate.  Patient has a slight elongation third digit right with distal keratotic lesion that is painful when pressed no drainage was noted currently with slight redness of the digit itself improved with antibiotics with the pain really at this point only in the distal portion of the toe where the lesion is present.  Good digital perfusion well oriented x3     Assessment:  Probability that this is keratotic tissue formation secondary to structure of the toe which probably broke down slightly creating a low-grade localized infection that did well with antibiotic     Plan:  H&P reviewed condition and sharp sterile debridement of the lesion accomplished with buttress pad dispensed to lift the toe and I did discuss surgical correction which may be necessary in this case.  I explained the procedure and risk and patient may have to have this done depending on response to conservative treatment rendered today  X-rays indicate no signs of osteolysis there is some distal contracture digit 3 right with elongation deformity

## 2022-04-02 ENCOUNTER — Telehealth (INDEPENDENT_AMBULATORY_CARE_PROVIDER_SITE_OTHER): Payer: 59 | Admitting: Family Medicine

## 2022-04-02 ENCOUNTER — Encounter: Payer: Self-pay | Admitting: Family Medicine

## 2022-04-02 VITALS — BP 140/72

## 2022-04-02 DIAGNOSIS — M7989 Other specified soft tissue disorders: Secondary | ICD-10-CM | POA: Diagnosis not present

## 2022-04-02 DIAGNOSIS — I10 Essential (primary) hypertension: Secondary | ICD-10-CM

## 2022-04-02 DIAGNOSIS — M79674 Pain in right toe(s): Secondary | ICD-10-CM

## 2022-04-02 MED ORDER — BENAZEPRIL HCL 40 MG PO TABS: 40.0000 mg | ORAL_TABLET | Freq: Every day | ORAL | 2 refills | Status: DC

## 2022-04-02 NOTE — Progress Notes (Signed)
Name: James Medina   MRN: [id number]    DOB: November 21, 1945   Date:04/02/2022       Progress Note  Subjective:    Chief Complaint  Chief Complaint  Patient presents with   Follow-up   Hypertension    Pt states readings at home average 010U and diastolic 72Z.   Foot Swelling    Pt has an appointment scheduled with podiatry by the end of this month, pt states still sore and red    I connected with  James Medina on 04/02/22 at  3:00 PM EDT by telephone and verified that I am speaking with the correct person using two identifiers.   I discussed the limitations, risks, security and privacy concerns of performing an evaluation and management service by telephone and the availability of in person appointments. Staff also discussed with the patient that there may be a patient responsible charge related to this service.  Patient verbalized understanding and agreed to proceed with encounter. Patient Location: home Provider Location: [facility] office  Additional Individuals present: none  Hypertension    Toe/foot pain is a little better after abx and podiatrist - but still hurts and still red, he has f/up on the 30th  Appt today was for BP recheck - he has been monitoring it at home about once every other day, most readings SBP 140-155, over the weekend BP's >160 and he took a second dose of his bp med - benazepril 20 mg Earlier today BP high, he again took a second dose (on his own) and bp Rechecked at 1:45 pm today then improved to 131/66  BP Readings from Last 3 Encounters:  04/02/22 (!) 140/72  03/18/22 (!) 160/84  12/31/21 138/80       Patient Active Problem List   Diagnosis Date Noted   History of gout 12/31/2021   Elevated LFTs 10/24/2019   Prediabetes 08/03/2019   Obstructive sleep apnea 07/14/2018   Pulmonary nodule 07/14/2018   Osteoarthritis of knee 04/04/2018   Thoracic aortic aneurysm without rupture ([place]) 01/25/2018   Alcohol dependence, daily use ([place])  10/18/2017   Osteoarthritis of both hands 10/18/2017   Gout 03/25/2017   Elevated hemoglobin ([place]) 36/64/4034   Umbilical hernia without obstruction or gangrene 04/26/2016   Impaired fasting glucose 09/16/2015   Macrocytosis without anemia 09/16/2015   Hematospermia 09/05/2015   Abnormal CBC 06/08/2015   Mixed hyperlipidemia    Essential hypertension    ED (erectile dysfunction)    Impotence     Social History   Tobacco Use   Smoking status: Former    Packs/day: 1.00    Years: 15.00    Total pack years: 15.00    Types: Cigars, Cigarettes    Quit date: 04/06/2012    Years since quitting: 9.9   Smokeless tobacco: Current    Types: Chew   Tobacco comments:    small amount per pt  Substance Use Topics   Alcohol use: Yes    Alcohol/week: 3.0 - 4.0 standard drinks of alcohol    Types: 3 - 4 Cans of beer per week     Current Outpatient Medications:    atorvastatin (LIPITOR) 10 MG tablet, Take 1 tablet (10 mg total) by mouth every other day. In the evening, Disp: 45 tablet, Rfl: 3   benazepril (LOTENSIN) 20 MG tablet, Take 1 tablet (20 mg total) by mouth daily., Disp: 90 tablet, Rfl: 1   colchicine 0.6 MG tablet, Take two pills at the onset of  a gout attack; take one more pill one hour later, Disp: 3 tablet, Rfl: 5   sildenafil (VIAGRA) 25 MG tablet, Take 1-2 tablets (25-50 mg total) by mouth daily as needed for erectile dysfunction., Disp: 10 tablet, Rfl: 0   tadalafil (CIALIS) 20 MG tablet, TAKE 1 TABLET BY MOUTH DAILY AS NEEDED FOR ERECTILE DYSFUNCTION. TAKE 30 MINUTES PRIOR TO SEXUAL ACTIVITY, Disp: 10 tablet, Rfl: 3  No Known Allergies  Chart Review: I personally reviewed active problem list, medication list, allergies, family history, social history, health maintenance, notes from last encounter, lab results, imaging with the patient/caregiver today.   Review of Systems  Constitutional: Negative.   HENT: Negative.    Eyes: Negative.   Respiratory: Negative.     Cardiovascular: Negative.   Gastrointestinal: Negative.   Endocrine: Negative.   Genitourinary: Negative.   Musculoskeletal: Negative.   Skin: Negative.   Allergic/Immunologic: Negative.   Neurological: Negative.   Hematological: Negative.   Psychiatric/Behavioral: Negative.    All other systems reviewed and are negative.    Objective:    Virtual encounter, vitals limited, only able to obtain the following Today's Vitals   04/02/22 1012  BP: (!) 140/72   There is no height or weight on file to calculate BMI. Nursing Note and Vital Signs reviewed.  Physical Exam Vitals and nursing note reviewed.  Neurological:     Mental Status: He is alert.  Psychiatric:        Mood and Affect: Mood normal.     PE limited by telephone encounter  No results found for this or any previous visit (from the past 72 hour(s)).  Assessment and Plan:     ICD-10-CM   1. Essential hypertension  I10 benazepril (LOTENSIN) 40 MG tablet   home BP monitoring still elevated almost all days he has checked it, increase benazepril to 40 mg once daily and f/up in office in 3-4 weeks    2. Pain and swelling of toe of right foot  M79.674    M79.89    still red and painful, after abx and seeing specialists it mildly improved, he has f/up on 10/30     Home BP readings all SBP >140 140-160's Increase med, continue DASH F/up in 3-4 weeks If not at goal will need to add additional med to benazepril - pt instructed to take 2 of his 20 mg benazepril once in the morning daily until out then pick up the new dose Rx  Pain and swelling toe- I reviewed his specialist note with him, he still has redness and pain, I am unsure if podiatry expected more improvement than that - encouraged him to call their office and ask if he should be improved or rechecked?  He does have f/up on 10/30   - I discussed the assessment and treatment plan with the patient. The patient was provided an opportunity to ask questions and  all were answered. The patient agreed with the plan and demonstrated an understanding of the instructions.  - The patient was advised to call back or seek an in-person evaluation if the symptoms worsen or if the condition fails to improve as anticipated.  I provided 15 minutes of non-face-to-face time during this encounter.  Delsa Grana, PA-C 04/02/22 2:40 PM

## 2022-04-16 ENCOUNTER — Other Ambulatory Visit: Payer: Self-pay | Admitting: Internal Medicine

## 2022-04-16 DIAGNOSIS — N529 Male erectile dysfunction, unspecified: Secondary | ICD-10-CM

## 2022-04-19 ENCOUNTER — Encounter: Payer: Self-pay | Admitting: Podiatry

## 2022-04-19 ENCOUNTER — Ambulatory Visit (INDEPENDENT_AMBULATORY_CARE_PROVIDER_SITE_OTHER): Payer: 59 | Admitting: Podiatry

## 2022-04-19 DIAGNOSIS — L84 Corns and callosities: Secondary | ICD-10-CM | POA: Diagnosis not present

## 2022-04-19 DIAGNOSIS — M2041 Other hammer toe(s) (acquired), right foot: Secondary | ICD-10-CM

## 2022-04-19 NOTE — Progress Notes (Signed)
Subjective:   Patient ID: James Medina, male   DOB: 76 y.o.   MRN: [id number]   HPI Patient states he has improvement but he knows he has a digital deformity and is wondering about surgery would like to review   ROS      Objective:  Physical Exam  Neurovascular status found to be intact with structural deformity and elongation deformity third digit right with distal keratotic lesion painful when pressed which make shoe gear difficult     Assessment:  Hammertoe deformity digit 3 right with keratotic lesion formation     Plan:  Reviewed condition discussed distal arthroplasty third right which I think would be in his best interest and he like to get it done but needs to hold off short-term.  I educated him on the surgery recovery and where we would do this at the surgical center and today I did a small amount of debridement he will be seen back prior to surgery I will schedule with the scheduling team

## 2022-04-19 NOTE — Telephone Encounter (Signed)
Requested Prescriptions  Pending Prescriptions Disp Refills  . tadalafil (CIALIS) 20 MG tablet [Pharmacy Med Name: tadalafil 20 mg tablet] 10 tablet 3    Sig: TAKE 1 TABLET BY MOUTH DAILY AS NEEDED FOR ERECTILE DYSFUNCTION. TAKE 30 MINUTES PRIOR TO SEXUAL ACTIVITY     Urology: Erectile Dysfunction Agents Failed - 04/16/2022  4:01 PM      Failed - AST in normal range and within 360 days    AST  Date Value Ref Range Status  07/10/2021 49 (H) 10 - 35 U/L Final         Failed - Last BP in normal range    BP Readings from Last 1 Encounters:  04/02/22 (!) 140/72         Passed - ALT in normal range and within 360 days    ALT  Date Value Ref Range Status  07/10/2021 31 9 - 46 U/L Final         Passed - Valid encounter within last 12 months    Recent Outpatient Visits          2 weeks ago Essential hypertension   [facility] Delsa Grana, PA-C   1 month ago Pain and swelling of toe of right foot   [facility] Delsa Grana, PA-C   3 months ago Essential hypertension   [place], DO   9 months ago Essential hypertension   [place], DO   1 year ago Elevated LFTs   [facility] Delsa Grana, PA-C      Future Appointments            In 1 week Delsa Grana, PA-C [facility], [place]   In 2 months Delsa Grana, PA-C [facility], [facility]

## 2022-04-30 ENCOUNTER — Encounter: Payer: Self-pay | Admitting: Family Medicine

## 2022-04-30 ENCOUNTER — Other Ambulatory Visit: Payer: Self-pay | Admitting: Family Medicine

## 2022-04-30 ENCOUNTER — Ambulatory Visit (INDEPENDENT_AMBULATORY_CARE_PROVIDER_SITE_OTHER): Payer: 59 | Admitting: Family Medicine

## 2022-04-30 VITALS — BP 132/70 | HR 96 | Temp 98.8°F | Resp 16 | Ht 72.0 in | Wt 222.0 lb

## 2022-04-30 DIAGNOSIS — Z1211 Encounter for screening for malignant neoplasm of colon: Secondary | ICD-10-CM | POA: Diagnosis not present

## 2022-04-30 DIAGNOSIS — F17220 Nicotine dependence, chewing tobacco, uncomplicated: Secondary | ICD-10-CM

## 2022-04-30 DIAGNOSIS — I10 Essential (primary) hypertension: Secondary | ICD-10-CM | POA: Diagnosis not present

## 2022-04-30 DIAGNOSIS — Z760 Encounter for issue of repeat prescription: Secondary | ICD-10-CM

## 2022-04-30 NOTE — Telephone Encounter (Signed)
Requested Prescriptions  Pending Prescriptions Disp Refills   sildenafil (VIAGRA) 25 MG tablet [Pharmacy Med Name: sildenafil 25 mg tablet] 10 tablet 2    Sig: Take 1-2 tablets by mouth daily as needed for erectile dysfunction.     Urology: Erectile Dysfunction Agents Failed - 04/30/2022  1:47 PM      Failed - AST in normal range and within 360 days    AST  Date Value Ref Range Status  07/10/2021 49 (H) 10 - 35 U/L Final         Passed - ALT in normal range and within 360 days    ALT  Date Value Ref Range Status  07/10/2021 31 9 - 46 U/L Final         Passed - Last BP in normal range    BP Readings from Last 1 Encounters:  04/30/22 132/70         Passed - Valid encounter within last 12 months    Recent Outpatient Visits           Today Essential hypertension   [facility] Delsa Grana, PA-C   4 weeks ago Essential hypertension   [facility] [place], Kristeen Miss, PA-C   1 month ago Pain and swelling of toe of right foot   [facility] Delsa Grana, PA-C   4 months ago Essential hypertension   [place], DO   10 months ago Essential hypertension   [place], DO       Future Appointments             In 2 months Delsa Grana, PA-C [facility], [facility]

## 2022-04-30 NOTE — Patient Instructions (Signed)
I can send in chantix or wellbutrin - they both help with nicotine use and stopping (cessation)  Blood Pressure Record Sheet  Blood pressure log Date: _______________________ a.m. _____________________(1st reading) _____________________(2nd reading) p.m. _____________________(1st reading) _____________________(2nd reading) Date: _______________________ a.m. _____________________(1st reading) _____________________(2nd reading) p.m. _____________________(1st reading) _____________________(2nd reading) Date: _______________________ a.m. _____________________(1st reading) _____________________(2nd reading) p.m. _____________________(1st reading) _____________________(2nd reading) Date: _______________________ a.m. _____________________(1st reading) _____________________(2nd reading) p.m. _____________________(1st reading) _____________________(2nd reading) Date: _______________________ a.m. _____________________(1st reading) _____________________(2nd reading) p.m. _____________________(1st reading) _____________________(2nd reading) Date: _______________________ a.m. _____________________(1st reading) _____________________(2nd reading) p.m. _____________________(1st reading) _____________________(2nd reading) Date: _______________________ a.m. _____________________(1st reading) _____________________(2nd reading) p.m. _____________________(1st reading) _____________________(2nd reading) Date: _______________________ a.m. _____________________(1st reading) _____________________(2nd reading) p.m. _____________________(1st reading) _____________________(2nd reading) Date: _______________________ a.m. _____________________(1st reading) _____________________(2nd reading) p.m. _____________________(1st reading) _____________________(2nd reading) Date: _______________________ a.m. _____________________(1st reading) _____________________(2nd reading) p.m. _____________________(1st reading)  _____________________(2nd reading)   How to Take Your Blood Pressure Blood pressure measures how strongly your blood is pressing against the walls of your arteries. Arteries are blood vessels that carry blood from your heart throughout your body. You can take your blood pressure at home with a machine. You may need to check your blood pressure at home: To check if you have high blood pressure (hypertension). To check your blood pressure over time. To make sure your blood pressure medicine is working. Supplies needed: Blood pressure machine, or monitor. A chair to sit in. This should be a chair where you can sit upright with your back supported. Do not sit on a soft couch or an armchair. Table or desk. Small notebook. Pencil or pen. How to prepare Avoid these things for 30 minutes before checking your blood pressure: Having drinks with caffeine in them, such as coffee or tea. Drinking alcohol. Eating. Smoking. Exercising. Do these things five minutes before checking your blood pressure: Go to the bathroom and pee (urinate). Sit in a chair. Be quiet. Do not talk. How to take your blood pressure Follow the instructions that came with your machine. If you have a digital blood pressure monitor, these may be the instructions: Sit up straight. Place your feet on the floor. Do not cross your ankles or legs. Rest your left arm at the level of your heart. You may rest it on a table, desk, or chair. Pull up your shirt sleeve. Wrap the blood pressure cuff around the upper part of your left arm. The cuff should be 1 inch (2.5 cm) above your elbow. It is best to wrap the cuff around bare skin. Fit the cuff snugly around your arm, but not too tightly. You should be able to place only one finger between the cuff and your arm. Place the cord so that it rests in the bend of your elbow. Press the power button. Sit quietly while the cuff fills with air and loses air. Write down the numbers on the  screen. Wait 2-3 minutes and then repeat steps 1-10. What do the numbers mean? Two numbers make up your blood pressure. The first number is called systolic pressure. The second is called diastolic pressure. An example of a blood pressure reading is "120 over 80" (or 120/80). If you are an adult and do not have a medical condition, use this guide to find out if your blood pressure is normal: Normal First number: below 120. Second number: below 80. Elevated First number: 120-129. Second number: below 80. Hypertension stage 1 First number: 130-139. Second number: 80-89. Hypertension stage 2 First number: 140 or above. Second number: 16  or above. Your blood pressure is above normal even if only the first or only the second number is above normal. Follow these instructions at home: Medicines Take over-the-counter and prescription medicines only as told by your doctor. Tell your doctor if your medicine is causing side effects. General instructions Check your blood pressure as often as your doctor tells you to. Check your blood pressure at the same time every day. Take your monitor to your next doctor's appointment. Your doctor will: Make sure you are using it correctly. Make sure it is working right. Understand what your blood pressure numbers should be. Keep all follow-up visits. General tips You will need a blood pressure machine or monitor. Your doctor can suggest a monitor. You can buy one at a drugstore or online. When choosing one: Choose one with an arm cuff. Choose one that wraps around your upper arm. Only one finger should fit between your arm and the cuff. Do not choose one that measures your blood pressure from your wrist or finger. Where to find more information American Heart Association: www.heart.org Contact a doctor if: Your blood pressure keeps being high. Your blood pressure is suddenly low. Get help right away if: Your first blood pressure number is higher than  180. Your second blood pressure number is higher than 120. These symptoms may be an emergency. Do not wait to see if the symptoms will go away. Get help right away. Call 911. Summary Check your blood pressure at the same time every day. Avoid caffeine, alcohol, smoking, and exercise for 30 minutes before checking your blood pressure. Make sure you understand what your blood pressure numbers should be. This information is not intended to replace advice given to you by your health care provider. Make sure you discuss any questions you have with your health care provider. Document Revised: 02/19/2021 Document Reviewed: 02/19/2021 Elsevier Patient Education  [place].    Nicotine Patches What is this medication? NICOTINE ([place] teen) helps you quit smoking. It reduces cravings for nicotine, the addictive substance found in tobacco. It may also help reduce symptoms of withdrawal. It is most effective when used in combination with a stop-smoking program. This medicine may be used for other purposes; ask your health care provider or pharmacist if you have questions. COMMON BRAND NAME(S): Habitrol, Nicoderm CQ, Nicotrol What should I tell my care team before I take this medication? They need to know if you have any of these conditions: Diabetes Heart disease, angina, irregular heartbeat or previous heart attack High blood pressure Lung disease, including asthma Overactive thyroid Pheochromocytoma Seizures or a history of seizures Skin problems, like eczema Stomach problems or ulcers An unusual or allergic reaction to nicotine, adhesives, other medications, foods, dyes, or preservatives Pregnant or trying to get pregnant Breast-feeding How should I use this medication? This medication is for external use only. Follow the directions on the label. Do not cut or trim the patch. After applying the patch, wash your hands. Change the patch every day in the morning, keeping to a regular  schedule. When you apply a new patch, use a new area of skin. Wait at least 1 week before using the same area again. This medication comes with INSTRUCTIONS FOR USE. Ask your pharmacist for directions on how to use this medication. Read the information carefully. Talk to your pharmacist or care team if you have questions. Talk to your care team about the use of this medication in children. Special care may be needed. Overdosage:  If you think you have taken too much of this medicine contact a poison control center or emergency room at once. NOTE: This medicine is only for you. Do not share this medicine with others. What if I miss a dose? If you forget to replace a patch, use it as soon as you can. Only use one patch at a time and do not leave on the skin for longer than directed. If a patch falls off, you can replace it, but keep to your schedule and remove the patch at the right time. What may interact with this medication? Certain medications for lung or breathing disease, like asthma Medications for blood pressure Medications for depression This list may not describe all possible interactions. Give your health care provider a list of all the medicines, herbs, non-prescription drugs, or dietary supplements you use. Also tell them if you smoke, drink alcohol, or use illegal drugs. Some items may interact with your medicine. What should I watch for while using this medication? Visit your care team for regular checks on your progress. Talk to your care team about what you can do to improve your chances of quitting. If you are going to need surgery, an MRI, CT scan, or other procedure, tell your care team that you are using this medication. You may need to remove the patch before the procedure. What side effects may I notice from receiving this medication? Side effects that you should report to your care team as soon as possible: Allergic reactions--skin rash, itching, hives, swelling of the face,  lips, tongue, or throat Heart palpitations--rapid, pounding, or irregular heartbeat Increase in blood pressure Side effects that usually do not require medical attention (report to your care team if they continue or are bothersome): Dizziness Headache Heartburn Hiccups Irritation at application site Trouble sleeping This list may not describe all possible side effects. Call your doctor for medical advice about side effects. You may report side effects to FDA at 1-800-FDA-1088. Where should I keep my medication? This product may have enough nicotine in it to make children and pets sick. Keep it away from children and pets. After using, throw away as directed on the package. Store at room temperature between 20 and 25 degrees C (68 and 77 degrees F). Protect from heat and light. Keep this medication in the original container until ready to use. Throw away unused medication after the expiration date. NOTE: This sheet is a summary. It may not cover all possible information. If you have questions about this medicine, talk to your doctor, pharmacist, or health care provider.  2023 Elsevier/Gold Standard (2020-08-11 00:00:00)

## 2022-04-30 NOTE — Progress Notes (Signed)
Patient ID: James Medina, male    DOB: 1946/04/12, 76 y.o.   MRN: [id number]  PCP: Delsa Grana, PA-C  Chief Complaint  Patient presents with   Follow-up   Hypertension    Pt states at home BP reading are lower than in office visits. Pt demonstrated memory reading for his machine its scattered everywhere.    Subjective:   James Medina is a 76 y.o. male, presents to clinic with CC of the following:  HPI   HTN no lotensin 40 mg daily BP Readings from Last 5 Encounters:  04/30/22 132/70  04/02/22 (!) 140/72  03/18/22 (!) 160/84  12/31/21 138/80  06/30/21 122/74   At home he's having readings 90/"sometime" 120, 130 150's lowest was 96/49 Today bp controlled and near goal No sx with different bp's  Nicotine - 2 oz dip one can a day - [employer]  Uses at work, habit interested in quitting     04/30/2022    9:21 AM 04/02/2022   10:11 AM 12/31/2021    8:14 AM  Depression screen PHQ 2/9  Decreased Interest 0 0 0  Down, Depressed, Hopeless 0 0 0  PHQ - 2 Score 0 0 0  Altered sleeping 0 0 0  Tired, decreased energy 0 0 0  Change in appetite 0 0 0  Feeling bad or failure about yourself  0 0 0  Trouble concentrating 0 0 0  Moving slowly or fidgety/restless 0 0 0  Suicidal thoughts 0 0 0  PHQ-9 Score 0 0 0  Difficult doing work/chores Not difficult at all Not difficult at all Not difficult at all   No hx of anxiety or insomnia       Patient Active Problem List   Diagnosis Date Noted   History of gout 12/31/2021   Elevated LFTs 10/24/2019   Prediabetes 08/03/2019   Obstructive sleep apnea 07/14/2018   Pulmonary nodule 07/14/2018   Osteoarthritis of knee 04/04/2018   Thoracic aortic aneurysm without rupture ([place]) 01/25/2018   Alcohol dependence, daily use ([place]) 10/18/2017   Osteoarthritis of both hands 10/18/2017   Gout 03/25/2017   Elevated hemoglobin ([place]) 67/67/2094   Umbilical hernia without obstruction or gangrene 04/26/2016   Impaired  fasting glucose 09/16/2015   Macrocytosis without anemia 09/16/2015   Hematospermia 09/05/2015   Abnormal CBC 06/08/2015   Mixed hyperlipidemia    Essential hypertension    ED (erectile dysfunction)    Impotence       Current Outpatient Medications:    atorvastatin (LIPITOR) 10 MG tablet, Take 1 tablet (10 mg total) by mouth every other day. In the evening, Disp: 45 tablet, Rfl: 3   benazepril (LOTENSIN) 40 MG tablet, Take 1 tablet (40 mg total) by mouth daily., Disp: 30 tablet, Rfl: 2   colchicine 0.6 MG tablet, Take two pills at the onset of a gout attack; take one more pill one hour later, Disp: 3 tablet, Rfl: 5   sildenafil (VIAGRA) 25 MG tablet, Take 1-2 tablets (25-50 mg total) by mouth daily as needed for erectile dysfunction., Disp: 10 tablet, Rfl: 0   tadalafil (CIALIS) 20 MG tablet, TAKE 1 TABLET BY MOUTH DAILY AS NEEDED FOR ERECTILE DYSFUNCTION. TAKE 30 MINUTES PRIOR TO SEXUAL ACTIVITY, Disp: 10 tablet, Rfl: 3   No Known Allergies   Social History   Tobacco Use   Smoking status: Former    Packs/day: 1.00    Years: 15.00    Total pack years: 15.00  Types: Cigars, Cigarettes    Quit date: 04/06/2012    Years since quitting: 10.0   Smokeless tobacco: Current    Types: Chew   Tobacco comments:    small amount per pt  Vaping Use   Vaping Use: Never used  Substance Use Topics   Alcohol use: Yes    Alcohol/week: 3.0 - 4.0 standard drinks of alcohol    Types: 3 - 4 Cans of beer per week   Drug use: Not Currently      Chart Review Today: I personally reviewed active problem list, medication list, allergies, family history, social history, health maintenance, notes from last encounter, lab results, imaging with the patient/caregiver today.   Review of Systems  Constitutional: Negative.   HENT: Negative.    Eyes: Negative.   Respiratory: Negative.    Cardiovascular: Negative.   Gastrointestinal: Negative.   Endocrine: Negative.   Genitourinary: Negative.    Musculoskeletal: Negative.   Skin: Negative.   Allergic/Immunologic: Negative.   Neurological: Negative.   Hematological: Negative.   Psychiatric/Behavioral: Negative.    All other systems reviewed and are negative.      Objective:   Vitals:   04/30/22 0921 04/30/22 0938  BP: (!) 152/74 132/70  Pulse: 96   Resp: 16   Temp: 98.8 F (37.1 C)   TempSrc: Oral   SpO2: 97%   Weight: 222 lb (100.7 kg)   Height: 6' (1.829 m)     Body mass index is 30.11 kg/m.  Physical Exam Vitals and nursing note reviewed.  Constitutional:      General: He is not in acute distress.    Appearance: Normal appearance. He is well-developed. He is not ill-appearing, toxic-appearing or diaphoretic.  HENT:     Head: Normocephalic and atraumatic.     Nose: Nose normal.  Eyes:     General:        Right eye: No discharge.        Left eye: No discharge.     Conjunctiva/sclera: Conjunctivae normal.  Neck:     Trachea: No tracheal deviation.  Cardiovascular:     Rate and Rhythm: Normal rate and regular rhythm.     Pulses: Normal pulses.     Heart sounds: Normal heart sounds.  Pulmonary:     Effort: Pulmonary effort is normal. No respiratory distress.     Breath sounds: No stridor.  Musculoskeletal:        General: Normal range of motion.  Skin:    General: Skin is warm and dry.     Findings: No rash.  Neurological:     Mental Status: He is alert.     Motor: No abnormal muscle tone.     Coordination: Coordination normal.  Psychiatric:        Behavior: Behavior normal.      Results for orders placed or performed in visit on 06/30/21  COMPLETE METABOLIC PANEL WITH GFR  Result Value Ref Range   Glucose, Bld 109 (H) 65 - 99 mg/dL   BUN 7 7 - 25 mg/dL   Creat 0.79 0.70 - 1.28 mg/dL   eGFR 94 > OR = 60 mL/min/1.25m   BUN/Creatinine Ratio NOT APPLICABLE 6 - 22 (calc)   Sodium 137 135 - 146 mmol/L   Potassium 4.0 3.5 - 5.3 mmol/L   Chloride 100 98 - 110 mmol/L   CO2 26 20 - 32 mmol/L    Calcium 9.4 8.6 - 10.3 mg/dL   Total Protein 7.5 6.1 - 8.1  g/dL   Albumin 4.2 3.6 - 5.1 g/dL   Globulin 3.3 1.9 - 3.7 g/dL (calc)   AG Ratio 1.3 1.0 - 2.5 (calc)   Total Bilirubin 1.2 0.2 - 1.2 mg/dL   Alkaline phosphatase (APISO) 85 35 - 144 U/L   AST 49 (H) 10 - 35 U/L   ALT 31 9 - 46 U/L  Lipid Profile  Result Value Ref Range   Cholesterol 159 <200 mg/dL   HDL 84 > OR = 40 mg/dL   Triglycerides 50 <150 mg/dL   LDL Cholesterol (Calc) 63 mg/dL (calc)   Total CHOL/HDL Ratio 1.9 <5.0 (calc)   Non-HDL Cholesterol (Calc) 75 <130 mg/dL (calc)  HgB A1c  Result Value Ref Range   Hgb A1c MFr Bld 5.4 <5.7 % of total Hgb   Mean Plasma Glucose 108 mg/dL   eAG (mmol/L) 6.0 mmol/L  PSA  Result Value Ref Range   PSA 1.56 < OR = 4.00 ng/mL       Assessment & Plan:   1. Essential hypertension Here for blood pressure recheck today on Lotensin 40 mg once daily He has wide range of readings at home he brings in his home blood pressure cuff today-90/49-150/90 Today blood pressure is well controlled For now we will continue the same medication and dose he was given information and education about how to take a blood pressure (see AVS) BP Readings from Last 3 Encounters:  04/30/22 132/70  04/02/22 (!) 140/72  03/18/22 (!) 160/84  Encouraged him to continue with good med compliance, work on diet and lifestyle, low-salt, cutting back on nicotine would also be helpful   2. Chewing tobacco nicotine dependence without complication Patient asked many questions about any medications he could take as a pill by mouth to help him to stop doing dip he uses about a can of tobacco a day we reviewed all kinds of medications including Wellbutrin, Chantix and nicotine patches and Nicorette gum explained how each of them work and how they could help him to gradually reduce the amount of dip which she is using Reviewed health risks from nicotine and we further reviewed withdrawal symptoms etc. After all of  that he declined to have me send in any patches or medications  Health Maintenance  Topic Date Due   Colon Cancer Screening  08/27/2021   COVID-19 Vaccine (4 - Moderna series) 05/16/2022*   Medicare Annual Wellness Visit  11/04/2022   Tetanus Vaccine  10/18/2027   Pneumonia Vaccine  Completed   Flu Shot  Completed   Hepatitis C Screening: USPSTF Recommendation to screen - Ages 18-79 yo.  Completed   Zoster (Shingles) Vaccine  Completed   HPV Vaccine  Aged Out  *Topic was postponed. The date shown is not the original due date.   Pt was due for 6 month f/up colonoscopy but lost to f/up with GI - referral entered   Return in about 6 months (around 10/29/2022) for Routine follow-up.     Delsa Grana, PA-C 04/30/22 9:38 AM

## 2022-07-08 NOTE — Progress Notes (Signed)
Established Patient Office Visit  Subjective:  Patient ID: James Medina, male    DOB: 1946/02/25  Age: 77 y.o. MRN: [id number]  CC:  Chief Complaint  Patient presents with   Follow-up   Hypertension   Hyperlipidemia    HPI James Medina presents for follow up on chronic medical conditions.   Hypertension/OSA: -Medications: Benazepril 40 mg -Patient is compliant with above medications and reports no side effects. -Checking BP at home (average): checking every once in awhile, average about 91-148/55-75 -Denies any SOB, CP, vision changes, LE edema or symptoms of hypotension  HLD: -Medications: Lipitor 10 mg- taking every other day -Last lipid panel: Lipid Panel     Component Value Date/Time   CHOL 159 07/10/2021 0821   CHOL 187 09/05/2015 0845   TRIG 50 07/10/2021 0821   HDL 84 07/10/2021 0821   HDL 88 09/05/2015 0845   CHOLHDL 1.9 07/10/2021 0821   VLDL 16 04/26/2016 1007   LDLCALC 63 07/10/2021 0821   LABVLDL 26 09/05/2015 0845   The 10-year ASCVD risk score (Arnett DK, et al., 2019) is: 22.5%   Values used to calculate the score:     Age: 41 years     Sex: Male     Is Non-Hispanic African American: No     Diabetic: No     Tobacco smoker: No     Systolic Blood Pressure: 562 mmHg     Is BP treated: Yes     HDL Cholesterol: 84 mg/dL     Total Cholesterol: 159 mg/dL  Gout: -Had been on Allopurinol but not taking anymore, also uses Colchicine as needed -Last flare: only 2 flares ever both in hand, last flare 2022 -Last uric acid: 6.5 7/22  Hx of Pre-Diabetes: -Last A1c 5.4 1/23  Elevated LFTs: -Last CMP in 1/23 showing AST elevated to 49, ALT normal   Health Maintenance: -Blood work due -Colon cancer screening: colonoscopy in 3/22 - tubular adenoma with recommendations to repeat in 1 year -due - patient will call and schedule. Wife passed away in Oct 09, 2022, may need help securing a ride, will let us know.  -Lung caner screening: smoked 20 years, quit  about 10 years ago  Past Medical History:  Diagnosis Date   Alcoholism ([place]) 10/18/2017   ED (erectile dysfunction)    Hyperlipidemia    Hypertension    Impotence    Marijuana use 01/25/2018   OA (osteoarthritis)    hand   Obstructive sleep apnea 07/14/2018   Managed by pulmonologist   Sleep apnea     Past Surgical History:  Procedure Laterality Date   APPENDECTOMY     broken ankle     COLONOSCOPY WITH PROPOFOL N/A 08/27/2020   Procedure: COLONOSCOPY WITH PROPOFOL;  Surgeon: Virgel Manifold, MD;  Location: [facility] ENDOSCOPY;  Service: Endoscopy;  Laterality: N/A;    Family History  Problem Relation Age of Onset   Cancer Mother        leukemia   Heart disease Father    Learning disabilities Maternal Uncle    Diabetes Brother    Stroke Brother    Diabetes Brother    Cancer Maternal Grandmother    Emphysema Maternal Grandfather    Heart disease Sister        pacemaker   COPD Neg Hx    Hypertension Neg Hx     Social History   Socioeconomic History   Marital status: Widowed    Spouse name: Not on file  Number of children: 2   Years of education: Not on file   Highest education level: Some college, no degree  Occupational History   Not on file  Tobacco Use   Smoking status: Former    Packs/day: 1.00    Years: 15.00    Total pack years: 15.00    Types: Cigars, Cigarettes    Quit date: 04/06/2012    Years since quitting: 10.2   Smokeless tobacco: Current    Types: Chew   Tobacco comments:    One can a day   Vaping Use   Vaping Use: Never used  Substance and Sexual Activity   Alcohol use: Yes    Alcohol/week: 3.0 - 4.0 standard drinks of alcohol    Types: 3 - 4 Cans of beer per week   Drug use: Not Currently   Sexual activity: Yes  Other Topics Concern   Not on file  Social History Narrative   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 still works as Museum/gallery conservator.    Social Determinants of Health   Financial Resource Strain: Low Risk  (11/03/2021)    Overall Financial Resource Strain (CARDIA)    Difficulty of Paying Living Expenses: Not hard at all  Food Insecurity: No Food Insecurity (11/03/2021)   Hunger Vital Sign    Worried About Running Out of Food in the Last Year: Never true    Ran Out of Food in the Last Year: Never true  Transportation Needs: No Transportation Needs (11/03/2021)   PRAPARE - Hydrologist (Medical): No    Lack of Transportation (Non-Medical): No  Physical Activity: Insufficiently Active (11/03/2021)   Exercise Vital Sign    Days of Exercise per Week: 2 days    Minutes of Exercise per Session: 30 min  Stress: No Stress Concern Present (11/03/2021)   [place]    Feeling of Stress : Not at all  Social Connections: Socially Isolated (11/03/2021)   Social Connection and Isolation Panel [NHANES]    Frequency of Communication with Friends and Family: More than three times a week    Frequency of Social Gatherings with Friends and Family: Three times a week    Attends Religious Services: Never    Active Member of Clubs or Organizations: No    Attends Archivist Meetings: Never    Marital Status: Widowed  Intimate Partner Violence: Not At Risk (11/03/2021)   Humiliation, Afraid, Rape, and Kick questionnaire    Fear of Current or Ex-Partner: No    Emotionally Abused: No    Physically Abused: No    Sexually Abused: No    Outpatient Medications Prior to Visit  Medication Sig Dispense Refill   atorvastatin (LIPITOR) 10 MG tablet Take 1 tablet (10 mg total) by mouth every other day. In the evening 45 tablet 3   benazepril (LOTENSIN) 40 MG tablet Take 1 tablet (40 mg total) by mouth daily. 30 tablet 2   colchicine 0.6 MG tablet Take two pills at the onset of a gout attack; take one more pill one hour later 3 tablet 5   sildenafil (VIAGRA) 25 MG tablet Take 1-2 tablets by mouth daily as needed for erectile dysfunction.  10 tablet 2   tadalafil (CIALIS) 20 MG tablet TAKE 1 TABLET BY MOUTH DAILY AS NEEDED FOR ERECTILE DYSFUNCTION. TAKE 30 MINUTES PRIOR TO SEXUAL ACTIVITY 10 tablet 3   No facility-administered medications prior to visit.  No Known Allergies  ROS Review of Systems  Constitutional:  Negative for chills and fever.  Eyes:  Negative for visual disturbance.  Respiratory:  Negative for cough and shortness of breath.   Cardiovascular:  Negative for chest pain.  Neurological:  Negative for dizziness and headaches.      Objective:    Physical Exam Constitutional:      Appearance: Normal appearance.  HENT:     Head: Normocephalic and atraumatic.  Eyes:     Conjunctiva/sclera: Conjunctivae normal.  Cardiovascular:     Rate and Rhythm: Normal rate and regular rhythm.  Pulmonary:     Effort: Pulmonary effort is normal.     Breath sounds: Normal breath sounds.  Musculoskeletal:     Right lower leg: No edema.     Left lower leg: No edema.  Skin:    General: Skin is warm and dry.  Neurological:     General: No focal deficit present.     Mental Status: He is alert. Mental status is at baseline.  Psychiatric:        Mood and Affect: Mood normal.        Behavior: Behavior normal.     BP 134/76   Pulse 88   Temp 99.1 F (37.3 C) (Oral)   Resp 16   Ht 6' (1.829 m)   Wt 218 lb 14.4 oz (99.3 kg)   SpO2 93%   BMI 29.69 kg/m  Wt Readings from Last 3 Encounters:  07/09/22 218 lb 14.4 oz (99.3 kg)  04/30/22 222 lb (100.7 kg)  03/18/22 225 lb 12.8 oz (102.4 kg)   Vitals:   07/09/22 1324  BP: 134/76      Health Maintenance Due  Topic Date Due   COLONOSCOPY (Pts 45-8yr Insurance coverage will need to be confirmed)  08/27/2021    There are no preventive care reminders to display for this patient.  No results found for: "TSH" Lab Results  Component Value Date   WBC 10.3 12/29/2020   HGB 17.1 12/29/2020   HCT 48.1 12/29/2020   MCV 98.2 12/29/2020   PLT 245 12/29/2020    Lab Results  Component Value Date   NA 137 07/10/2021   K 4.0 07/10/2021   CO2 26 07/10/2021   GLUCOSE 109 (H) 07/10/2021   BUN 7 07/10/2021   CREATININE 0.79 07/10/2021   BILITOT 1.2 07/10/2021   ALKPHOS 103 10/17/2017   AST 49 (H) 07/10/2021   ALT 31 07/10/2021   PROT 7.5 07/10/2021   ALBUMIN 3.5 10/17/2017   CALCIUM 9.4 07/10/2021   ANIONGAP 11 10/17/2017   EGFR 94 07/10/2021   Lab Results  Component Value Date   CHOL 159 07/10/2021   Lab Results  Component Value Date   HDL 84 07/10/2021   Lab Results  Component Value Date   LDLCALC 63 07/10/2021   Lab Results  Component Value Date   TRIG 50 07/10/2021   Lab Results  Component Value Date   CHOLHDL 1.9 07/10/2021   Lab Results  Component Value Date   HGBA1C 5.4 07/10/2021      Assessment & Plan:   1. Essential hypertension: Chronic, blood pressure stable. Continue Benazepril 40 mg daily, refilled. Labs due today. Follow up in 6 months to recheck.   - COMPLETE METABOLIC PANEL WITH GFR - CBC w/Diff/Platelet - benazepril (LOTENSIN) 40 MG tablet; Take 1 tablet (40 mg total) by mouth daily.  Dispense: 90 tablet; Refill: 1  2. Mixed hyperlipidemia: Chronic, recheck lipid  panel. Continue Lipitor 10 mg as he's been taking it for now.   - Lipid Profile  3. Prediabetes: Recheck A1c today.   - HgB A1c  4. Prostate cancer screening/Nocturia: Prostate cancer screening.   - PSA  5. Medication refill: Viagra refilled.   - sildenafil (VIAGRA) 25 MG tablet; Take 1-2 tablets by mouth daily as needed for erectile dysfunction.  Dispense: 10 tablet; Refill: 2  Follow-up: Return in about 6 months (around 01/07/2023).    Teodora Medici, DO

## 2022-07-09 ENCOUNTER — Ambulatory Visit (INDEPENDENT_AMBULATORY_CARE_PROVIDER_SITE_OTHER): Payer: 59 | Admitting: Internal Medicine

## 2022-07-09 ENCOUNTER — Encounter: Payer: Self-pay | Admitting: Internal Medicine

## 2022-07-09 VITALS — BP 134/76 | HR 88 | Temp 99.1°F | Resp 16 | Ht 72.0 in | Wt 218.9 lb

## 2022-07-09 DIAGNOSIS — R351 Nocturia: Secondary | ICD-10-CM

## 2022-07-09 DIAGNOSIS — Z125 Encounter for screening for malignant neoplasm of prostate: Secondary | ICD-10-CM

## 2022-07-09 DIAGNOSIS — E782 Mixed hyperlipidemia: Secondary | ICD-10-CM

## 2022-07-09 DIAGNOSIS — Z760 Encounter for issue of repeat prescription: Secondary | ICD-10-CM | POA: Diagnosis not present

## 2022-07-09 DIAGNOSIS — R7303 Prediabetes: Secondary | ICD-10-CM

## 2022-07-09 DIAGNOSIS — I10 Essential (primary) hypertension: Secondary | ICD-10-CM | POA: Diagnosis not present

## 2022-07-09 MED ORDER — SILDENAFIL CITRATE 25 MG PO TABS: ORAL_TABLET | ORAL | 2 refills | Status: AC

## 2022-07-09 MED ORDER — BENAZEPRIL HCL 40 MG PO TABS: 40.0000 mg | ORAL_TABLET | Freq: Every day | ORAL | 1 refills | Status: DC

## 2022-07-09 NOTE — Patient Instructions (Addendum)
It was great seeing you today!  Plan discussed at today's visit: -Blood work ordered today, results will be uploaded to [place].  -Medications refilled  -Please call to schedule colonoscopy   Follow up in: 6 months   Take care and let us know if you have any questions or concerns prior to your next visit.  Dr. Rosana Berger

## 2022-07-10 LAB — CBC WITH DIFFERENTIAL/PLATELET
Absolute Monocytes: 521 cells/uL (ref 200–950)
Basophils Absolute: 50 cells/uL (ref 0–200)
Basophils Relative: 0.6 %
Eosinophils Absolute: 160 cells/uL (ref 15–500)
Eosinophils Relative: 1.9 %
HCT: 49.8 % (ref 38.5–50.0)
Hemoglobin: 18.2 g/dL — ABNORMAL HIGH (ref 13.2–17.1)
Lymphs Abs: 1798 cells/uL (ref 850–3900)
MCH: 37.7 pg — ABNORMAL HIGH (ref 27.0–33.0)
MCHC: 36.5 g/dL — ABNORMAL HIGH (ref 32.0–36.0)
MCV: 103.1 fL — ABNORMAL HIGH (ref 80.0–100.0)
MPV: 8.9 fL (ref 7.5–12.5)
Monocytes Relative: 6.2 %
Neutro Abs: 5872 cells/uL (ref 1500–7800)
Neutrophils Relative %: 69.9 %
Platelets: 212 10*3/uL (ref 140–400)
RBC: 4.83 10*6/uL (ref 4.20–5.80)
RDW: 12.1 % (ref 11.0–15.0)
Total Lymphocyte: 21.4 %
WBC: 8.4 10*3/uL (ref 3.8–10.8)

## 2022-07-10 LAB — HEMOGLOBIN A1C
Hgb A1c MFr Bld: 5.7 % of total Hgb — ABNORMAL HIGH (ref <5.7)
Mean Plasma Glucose: 117 mg/dL
eAG (mmol/L): 6.5 mmol/L

## 2022-07-10 LAB — COMPLETE METABOLIC PANEL WITH GFR
AG Ratio: 1.2 (calc) (ref 1.0–2.5)
ALT: 20 U/L (ref 9–46)
AST: 37 U/L — ABNORMAL HIGH (ref 10–35)
Albumin: 3.9 g/dL (ref 3.6–5.1)
Alkaline phosphatase (APISO): 93 U/L (ref 35–144)
BUN/Creatinine Ratio: 11 (calc) (ref 6–22)
BUN: 7 mg/dL (ref 7–25)
CO2: 24 mmol/L (ref 20–32)
Calcium: 9 mg/dL (ref 8.6–10.3)
Chloride: 100 mmol/L (ref 98–110)
Creat: 0.65 mg/dL — ABNORMAL LOW (ref 0.70–1.28)
Globulin: 3.2 g/dL (calc) (ref 1.9–3.7)
Glucose, Bld: 110 mg/dL — ABNORMAL HIGH (ref 65–99)
Potassium: 3.6 mmol/L (ref 3.5–5.3)
Sodium: 136 mmol/L (ref 135–146)
Total Bilirubin: 0.9 mg/dL (ref 0.2–1.2)
Total Protein: 7.1 g/dL (ref 6.1–8.1)
eGFR: 99 mL/min/{1.73_m2} (ref >=60)

## 2022-07-10 LAB — LIPID PANEL
Cholesterol: 150 mg/dL (ref <200)
HDL: 65 mg/dL (ref >=40)
LDL Cholesterol (Calc): 69 mg/dL (calc)
Non-HDL Cholesterol (Calc): 85 mg/dL (calc) (ref <130)
Total CHOL/HDL Ratio: 2.3 (calc) (ref <5.0)
Triglycerides: 76 mg/dL (ref <150)

## 2022-07-10 LAB — PSA: PSA: 1.46 ng/mL (ref <=4.00)

## 2022-07-12 ENCOUNTER — Other Ambulatory Visit: Payer: Self-pay | Admitting: Family Medicine

## 2022-07-12 DIAGNOSIS — I10 Essential (primary) hypertension: Secondary | ICD-10-CM

## 2022-07-30 ENCOUNTER — Ambulatory Visit: Payer: Medicare Other | Admitting: Family Medicine

## 2022-08-04 ENCOUNTER — Telehealth: Payer: Self-pay

## 2022-08-04 DIAGNOSIS — Z86010 Personal history of colonic polyps: Secondary | ICD-10-CM

## 2022-08-04 NOTE — Telephone Encounter (Signed)
Pt would like to make appt for colonoscopy. James Medina sent letter on  nov.

## 2022-08-05 ENCOUNTER — Other Ambulatory Visit: Payer: Self-pay

## 2022-08-05 DIAGNOSIS — Z86010 Personal history of colonic polyps: Secondary | ICD-10-CM

## 2022-08-05 MED ORDER — GOLYTELY 236 G PO SOLR: 4000.0000 mL | Freq: Once | ORAL | 0 refills | Status: AC

## 2022-08-05 NOTE — Telephone Encounter (Signed)
Gastroenterology Pre-Procedure Review  Request Date: 08/30/22 Requesting Physician: Dr. Marius Ditch  PATIENT REVIEW QUESTIONS: The patient responded to the following health history questions as indicated:    1. Are you having any GI issues? no 2. Do you have a personal history of Polyps? yes (HISTORY OF COLON POLYPS LAST COLONOSCOPY 08/27/2020 DR. T. PERFORMED LAST COLONOSCOPY NOTED A 2 DAY PREP HOWEVER PATIENT IS HAVING REGULAR BOWEL MOVEMENTS NO ISSUES WITH CONSTIPATION SO WENT WITH GOLYTELY PREP W/OUT 2 DAY) 3. Do you have a family history of Colon Cancer or Polyps? no 4. Diabetes Mellitus? no 5. Joint replacements in the past 12 months?no 6. Major health problems in the past 3 months?no 7. Any artificial heart valves, MVP, or defibrillator?no    MEDICATIONS & ALLERGIES:    Patient reports the following regarding taking any anticoagulation/antiplatelet therapy:   Plavix, Coumadin, Eliquis, Xarelto, Lovenox, Pradaxa, Brilinta, or Effient? no Aspirin? no  Patient confirms/reports the following medications:  Current Outpatient Medications  Medication Sig Dispense Refill   atorvastatin (LIPITOR) 10 MG tablet Take 1 tablet (10 mg total) by mouth every other day. In the evening 45 tablet 3   benazepril (LOTENSIN) 40 MG tablet Take 1 tablet (40 mg total) by mouth daily. 90 tablet 1   colchicine 0.6 MG tablet Take two pills at the onset of a gout attack; take one more pill one hour later 3 tablet 5   sildenafil (VIAGRA) 25 MG tablet Take 1-2 tablets by mouth daily as needed for erectile dysfunction. 10 tablet 2   No current facility-administered medications for this visit.    Patient confirms/reports the following allergies:  No Known Allergies  No orders of the defined types were placed in this encounter.   AUTHORIZATION INFORMATION Primary Insurance: 1D#: Group #:  Secondary Insurance: 1D#: Group #:  SCHEDULE INFORMATION: Date: 08/30/22 Time: Location: [facility]

## 2022-08-05 NOTE — Addendum Note (Signed)
Addended by: Vanetta Mulders on: 08/05/2022 09:12 AM   Modules accepted: Orders

## 2022-08-27 ENCOUNTER — Encounter: Payer: Self-pay | Admitting: Gastroenterology

## 2022-08-30 ENCOUNTER — Ambulatory Visit
Admission: RE | Admit: 2022-08-30 | Discharge: 2022-08-30 | Disposition: A | Discharge status: Home / Self Care | Payer: 59 | Attending: Gastroenterology | Admitting: Gastroenterology

## 2022-08-30 ENCOUNTER — Encounter
Admission: RE | Disposition: A | Discharge status: Home / Self Care | Payer: Self-pay | Source: Home / Self Care | Attending: Gastroenterology

## 2022-08-30 ENCOUNTER — Ambulatory Visit: Payer: 59 | Admitting: Anesthesiology

## 2022-08-30 DIAGNOSIS — K635 Polyp of colon: Secondary | ICD-10-CM | POA: Diagnosis not present

## 2022-08-30 DIAGNOSIS — D124 Benign neoplasm of descending colon: Secondary | ICD-10-CM | POA: Diagnosis not present

## 2022-08-30 DIAGNOSIS — Z860100 Personal history of colon polyps, unspecified: Secondary | ICD-10-CM

## 2022-08-30 DIAGNOSIS — Z79899 Other long term (current) drug therapy: Secondary | ICD-10-CM | POA: Diagnosis not present

## 2022-08-30 DIAGNOSIS — D126 Benign neoplasm of colon, unspecified: Secondary | ICD-10-CM | POA: Diagnosis not present

## 2022-08-30 DIAGNOSIS — Z1211 Encounter for screening for malignant neoplasm of colon: Secondary | ICD-10-CM | POA: Diagnosis not present

## 2022-08-30 DIAGNOSIS — K621 Rectal polyp: Secondary | ICD-10-CM | POA: Diagnosis not present

## 2022-08-30 DIAGNOSIS — Z87891 Personal history of nicotine dependence: Secondary | ICD-10-CM | POA: Diagnosis not present

## 2022-08-30 DIAGNOSIS — I10 Essential (primary) hypertension: Secondary | ICD-10-CM | POA: Diagnosis not present

## 2022-08-30 DIAGNOSIS — E785 Hyperlipidemia, unspecified: Secondary | ICD-10-CM | POA: Insufficient documentation

## 2022-08-30 DIAGNOSIS — Z09 Encounter for follow-up examination after completed treatment for conditions other than malignant neoplasm: Secondary | ICD-10-CM | POA: Diagnosis not present

## 2022-08-30 HISTORY — PX: COLONOSCOPY WITH PROPOFOL: SHX5780

## 2022-08-30 SURGERY — COLONOSCOPY WITH PROPOFOL
Anesthesia: General

## 2022-08-30 MED ORDER — SODIUM CHLORIDE 0.9 % IV SOLN
INTRAVENOUS | Status: DC
  Administered 2022-08-30: 20 mL/h via INTRAVENOUS

## 2022-08-30 MED ORDER — PROPOFOL 500 MG/50ML IV EMUL
INTRAVENOUS | Status: DC | PRN
  Administered 2022-08-30: 150 ug/kg/min via INTRAVENOUS
  Administered 2022-08-30 (×2): 50 mg via INTRAVENOUS
  Administered 2022-08-30: 30 mg via INTRAVENOUS
  Administered 2022-08-30: 50 mg via INTRAVENOUS

## 2022-08-30 MED ORDER — SIMETHICONE 40 MG/0.6ML PO SUSP
ORAL | Status: DC | PRN
  Administered 2022-08-30: 180 mL

## 2022-08-30 MED ORDER — SODIUM CHLORIDE 0.9 % IV SOLN: INTRAVENOUS | Status: DC | PRN

## 2022-08-30 NOTE — Transfer of Care (Signed)
Immediate Anesthesia Transfer of Care Note  Patient: James Medina  Procedure(s) Performed: COLONOSCOPY WITH PROPOFOL  Patient Location: PACU  Anesthesia Type:MAC  Level of Consciousness: drowsy  Airway & Oxygen Therapy: Patient Spontanous Breathing  Post-op Assessment: Report given to RN and Post -op Vital signs reviewed and stable  Post vital signs: Reviewed  Last Vitals:  Vitals Value Taken Time  BP 97/59   Temp 92F   Pulse 72   Resp 15   SpO2 97%     Last Pain:  Vitals:   08/30/22 0838  TempSrc: Temporal  PainSc: 0-No pain         Complications: No notable events documented.

## 2022-08-30 NOTE — Anesthesia Preprocedure Evaluation (Signed)
Anesthesia Evaluation  Patient identified by MRN, date of birth, ID band Patient awake    Reviewed: Allergy & Precautions, NPO status , Patient's Chart, lab work & pertinent test results  Airway Mallampati: III  TM Distance: >3 FB Neck ROM: Full    Dental  (+) Partial Upper   Pulmonary neg pulmonary ROS, sleep apnea , Patient abstained from smoking., former smoker   Pulmonary exam normal  + decreased breath sounds      Cardiovascular Exercise Tolerance: Good hypertension, Pt. on medications negative cardio ROS Normal cardiovascular exam Rhythm:Regular Rate:Normal     Neuro/Psych negative neurological ROS  negative psych ROS   GI/Hepatic negative GI ROS, Neg liver ROS,,,  Endo/Other  negative endocrine ROS    Renal/GU negative Renal ROS  negative genitourinary   Musculoskeletal  (+) Arthritis ,    Abdominal  (+) + obese  Peds negative pediatric ROS (+)  Hematology negative hematology ROS (+)   Anesthesia Other Findings Past Medical History: 10/18/2017: Alcoholism ([place]) No date: ED (erectile dysfunction) No date: Hyperlipidemia No date: Hypertension No date: Impotence 01/25/2018: Marijuana use No date: OA (osteoarthritis)     Comment:  hand 07/14/2018: Obstructive sleep apnea     Comment:  Managed by pulmonologist No date: Sleep apnea  Past Surgical History: No date: APPENDECTOMY No date: broken ankle 08/27/2020: COLONOSCOPY WITH PROPOFOL; N/A     Comment:  Procedure: COLONOSCOPY WITH PROPOFOL;  Surgeon:               Virgel Manifold, MD;  Location: [facility] ENDOSCOPY;                Service: Endoscopy;  Laterality: N/A; No date: FRACTURE SURGERY  BMI    Body Mass Index: 29.70 kg/m      Reproductive/Obstetrics negative OB ROS                             Anesthesia Physical Anesthesia Plan  ASA: 2  Anesthesia Plan: General   Post-op Pain Management:    Induction:  Intravenous  PONV Risk Score and Plan: Propofol infusion and TIVA  Airway Management Planned: Natural Airway  Additional Equipment:   Intra-op Plan:   Post-operative Plan:   Informed Consent: I have reviewed the patients History and Physical, chart, labs and discussed the procedure including the risks, benefits and alternatives for the proposed anesthesia with the patient or authorized representative who has indicated his/her understanding and acceptance.     Dental Advisory Given  Plan Discussed with: CRNA and Surgeon  Anesthesia Plan Comments:        Anesthesia Quick Evaluation

## 2022-08-30 NOTE — H&P (Signed)
Cephas Darby, MD [address]  [place]  [place], [place] [address]  Main: [phone]  Fax: [phone] Pager: [phone]  Primary Care Physician:  Delsa Grana, PA-C Primary Gastroenterologist:  Dr. Cephas Darby  Pre-Procedure History & Physical: HPI:  James Medina is a 77 y.o. male is here for an colonoscopy.   Past Medical History:  Diagnosis Date   Alcoholism ([place]) 10/18/2017   ED (erectile dysfunction)    Hyperlipidemia    Hypertension    Impotence    Marijuana use 01/25/2018   OA (osteoarthritis)    hand   Obstructive sleep apnea 07/14/2018   Managed by pulmonologist   Sleep apnea     Past Surgical History:  Procedure Laterality Date   APPENDECTOMY     broken ankle     COLONOSCOPY WITH PROPOFOL N/A 08/27/2020   Procedure: COLONOSCOPY WITH PROPOFOL;  Surgeon: Virgel Manifold, MD;  Location: [facility] ENDOSCOPY;  Service: Endoscopy;  Laterality: N/A;   FRACTURE SURGERY      Prior to Admission medications   Medication Sig Start Date End Date Taking? Authorizing Provider  atorvastatin (LIPITOR) 10 MG tablet Take 1 tablet (10 mg total) by mouth every other day. In the evening 03/18/22  Yes Delsa Grana, PA-C  benazepril (LOTENSIN) 40 MG tablet Take 1 tablet (40 mg total) by mouth daily. 07/09/22  Yes Teodora Medici, DO  colchicine 0.6 MG tablet Take two pills at the onset of a gout attack; take one more pill one hour later 12/31/21  Yes Teodora Medici, DO  sildenafil (VIAGRA) 25 MG tablet Take 1-2 tablets by mouth daily as needed for erectile dysfunction. 07/09/22  Yes Teodora Medici, DO    Allergies as of 08/05/2022   (No Known Allergies)    Family History  Problem Relation Age of Onset   Cancer Mother        leukemia   Heart disease Father    Learning disabilities Maternal Uncle    Diabetes Brother    Stroke Brother    Diabetes Brother    Cancer Maternal Grandmother    Emphysema Maternal Grandfather    Heart disease Sister         pacemaker   COPD Neg Hx    Hypertension Neg Hx     Social History   Socioeconomic History   Marital status: Widowed    Spouse name: Not on file   Number of children: 2   Years of education: Not on file   Highest education level: Some college, no degree  Occupational History   Not on file  Tobacco Use   Smoking status: Former    Packs/day: 1.00    Years: 15.00    Total pack years: 15.00    Types: Cigars, Cigarettes    Quit date: 04/06/2012    Years since quitting: 10.4   Smokeless tobacco: Current    Types: Chew   Tobacco comments:    One can a day   Vaping Use   Vaping Use: Never used  Substance and Sexual Activity   Alcohol use: Yes    Alcohol/week: 3.0 - 4.0 standard drinks of alcohol    Types: 3 - 4 Cans of beer per week   Drug use: Not Currently   Sexual activity: Yes  Other Topics Concern   Not on file  Social History Narrative   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 still works as Museum/gallery conservator.    Social Determinants of Radio broadcast assistant  Strain: Low Risk  (11/03/2021)   Overall Financial Resource Strain (CARDIA)    Difficulty of Paying Living Expenses: Not hard at all  Food Insecurity: No Food Insecurity (11/03/2021)   Hunger Vital Sign    Worried About Running Out of Food in the Last Year: Never true    [place] in the Last Year: Never true  Transportation Needs: No Transportation Needs (11/03/2021)   PRAPARE - Hydrologist (Medical): No    Lack of Transportation (Non-Medical): No  Physical Activity: Insufficiently Active (11/03/2021)   Exercise Vital Sign    Days of Exercise per Week: 2 days    Minutes of Exercise per Session: 30 min  Stress: No Stress Concern Present (11/03/2021)   [place]    Feeling of Stress : Not at all  Social Connections: Socially Isolated (11/03/2021)   Social Connection and Isolation Panel [NHANES]    Frequency of  Communication with Friends and Family: More than three times a week    Frequency of Social Gatherings with Friends and Family: Three times a week    Attends Religious Services: Never    Active Member of Clubs or Organizations: No    Attends Archivist Meetings: Never    Marital Status: Widowed  Intimate Partner Violence: Not At Risk (11/03/2021)   Humiliation, Afraid, Rape, and Kick questionnaire    Fear of Current or Ex-Partner: No    Emotionally Abused: No    Physically Abused: No    Sexually Abused: No    Review of Systems: See HPI, otherwise negative ROS  Physical Exam: BP 134/77   Pulse 83   Temp (!) 96.3 F (35.7 C) (Temporal)   Resp 20   Ht 6' (1.829 m)   Wt 99.3 kg   SpO2 96%   BMI 29.70 kg/m  General:   Alert,  pleasant and cooperative in NAD Head:  Normocephalic and atraumatic. Neck:  Supple; no masses or thyromegaly. Lungs:  Clear throughout to auscultation.    Heart:  Regular rate and rhythm. Abdomen:  Soft, nontender and nondistended. Normal bowel sounds, without guarding, and without rebound.   Neurologic:  Alert and  oriented x4;  grossly normal neurologically.  Impression/Plan: James Medina is here for an colonoscopy to be performed for h/o colon adenoma, previous colonoscopy poor prep  Risks, benefits, limitations, and alternatives regarding  colonoscopy have been reviewed with the patient.  Questions have been answered.  All parties agreeable.   Sherri Sear, MD  08/30/2022, 8:53 AM

## 2022-08-30 NOTE — Anesthesia Postprocedure Evaluation (Signed)
Anesthesia Post Note  Patient: James Medina  Procedure(s) Performed: COLONOSCOPY WITH PROPOFOL  Patient location during evaluation: PACU Anesthesia Type: General Level of consciousness: awake and oriented Pain management: pain level controlled Vital Signs Assessment: post-procedure vital signs reviewed and stable Respiratory status: spontaneous breathing and nonlabored ventilation Cardiovascular status: stable Anesthetic complications: no   No notable events documented.   Last Vitals:  Vitals:   08/30/22 1014 08/30/22 1024  BP: 98/61 112/62  Pulse: 76 72  Resp: 19 (!) 21  Temp:    SpO2: 98% 100%    Last Pain:  Vitals:   08/30/22 1024  TempSrc:   PainSc: 0-No pain                 VAN STAVEREN,Lillian Tigges

## 2022-08-30 NOTE — Op Note (Signed)
[facility] Gastroenterology Patient Name: James Medina Procedure Date: 08/30/2022 9:29 AM MRN: [id number] Account #: [id number] Date of Birth: 1946-01-28 Admit Type: Outpatient Age: 77 Room: [facility] ENDO ROOM 4 Gender: Male Note Status: Finalized Instrument Name: Jasper Riling [id number] Procedure:             Colonoscopy Indications:           Surveillance: History of adenomatous polyps,                         inadequate prep on last exam (<13yr, Last colonoscopy:                         March 2022 Providers:             RLin LandsmanMD, MD Referring MD:          LDelsa Grana(Referring MD) Medicines:             General Anesthesia Complications:         No immediate complications. Estimated blood loss: None. Procedure:             Pre-Anesthesia Assessment:                        - Prior to the procedure, a History and Physical was                         performed, and patient medications and allergies were                         reviewed. The patient is competent. The risks and                         benefits of the procedure and the sedation options and                         risks were discussed with the patient. All questions                         were answered and informed consent was obtained.                         Patient identification and proposed procedure were                         verified by the physician, the nurse, the                         anesthesiologist, the anesthetist and the technician                         in the pre-procedure area in the procedure room in the                         endoscopy suite. Mental Status Examination: alert and                         oriented. Airway Examination: normal oropharyngeal  airway and neck mobility. Respiratory Examination:                         clear to auscultation. CV Examination: normal.                         Prophylactic Antibiotics: The patient does not  require                         prophylactic antibiotics. Prior Anticoagulants: The                         patient has taken no anticoagulant or antiplatelet                         agents. ASA Grade Assessment: II - A patient with mild                         systemic disease. After reviewing the risks and                         benefits, the patient was deemed in satisfactory                         condition to undergo the procedure. The anesthesia                         plan was to use general anesthesia. Immediately prior                         to administration of medications, the patient was                         re-assessed for adequacy to receive sedatives. The                         heart rate, respiratory rate, oxygen saturations,                         blood pressure, adequacy of pulmonary ventilation, and                         response to care were monitored throughout the                         procedure. The physical status of the patient was                         re-assessed after the procedure.                        After obtaining informed consent, the colonoscope was                         passed under direct vision. Throughout the procedure,                         the patient's blood pressure, pulse, and oxygen  saturations were monitored continuously. The                         Colonoscope was introduced through the anus and                         advanced to the the cecum, identified by appendiceal                         orifice and ileocecal valve. The colonoscopy was                         performed with moderate difficulty due to significant                         looping and the patient's body habitus. Successful                         completion of the procedure was aided by applying                         abdominal pressure. The patient tolerated the                         procedure well. The quality of the bowel  preparation                         was evaluated using the BBPS [facility] Bowel Preparation                         Scale) with scores of: Right Colon = 2 (minor amount                         of residual staining, small fragments of stool and/or                         opaque liquid, but mucosa seen well), Transverse Colon                         = 2 (minor amount of residual staining, small                         fragments of stool and/or opaque liquid, but mucosa                         seen well) and Left Colon = 2 (minor amount of                         residual staining, small fragments of stool and/or                         opaque liquid, but mucosa seen well). The total BBPS                         score equals 6. The ileocecal valve, appendiceal  orifice, and rectum were photographed. Findings:      The perianal and digital rectal examinations were normal. Pertinent       negatives include normal sphincter tone and no palpable rectal lesions.      Three sessile polyps were found in the descending colon. The polyps were       3 to 4 mm in size. These polyps were removed with a cold snare.       Resection and retrieval were complete. Estimated blood loss: none.      Two sessile polyps were found in the rectum. The polyps were 3 to 4 mm       in size. These polyps were removed with a cold snare. Resection and       retrieval were complete.      A diminutive polyp was found in the rectum. The polyp was sessile. The       polyp was removed with a cold biopsy forceps. Resection and retrieval       were complete.      The retroflexed view of the distal rectum and anal verge was normal and       showed no anal or rectal abnormalities.      Multiple small-mouthed diverticula were found in the recto-sigmoid colon       and sigmoid colon. Impression:            - Three 3 to 4 mm polyps in the descending colon,                         removed with a cold snare.  Resected and retrieved.                        - Two 3 to 4 mm polyps in the rectum, removed with a                         cold snare. Resected and retrieved.                        - One diminutive polyp in the rectum, removed with a                         cold biopsy forceps. Resected and retrieved.                        - The distal rectum and anal verge are normal on                         retroflexion view.                        - Diverticulosis in the recto-sigmoid colon and in the                         sigmoid colon. Recommendation:        - Discharge patient to home (with escort).                        - Resume previous diet today.                        - Continue  present medications.                        - Await pathology results.                        - Repeat colonoscopy in 2 years with 2 day prep                         because the bowel preparation was suboptimal and for                         surveillance of multiple polyps. Procedure Code(s):     --- Professional ---                        [phone], Colonoscopy, flexible; with removal of                         tumor(s), polyp(s), or other lesion(s) by snare                         technique                        45380, 52, Colonoscopy, flexible; with biopsy, single                         or multiple Diagnosis Code(s):     --- Professional ---                        Z86.010, Personal history of colonic polyps                        D12.4, Benign neoplasm of descending colon                        K57.30, Diverticulosis of large intestine without                         perforation or abscess without bleeding                        D12.8, Benign neoplasm of rectum CPT copyright 2022 American Medical Association. All rights reserved. The codes documented in this report are preliminary and upon coder review may  be revised to meet current compliance requirements. Dr. Ulyess Mort Lin Landsman MD,  MD 08/30/2022 10:10:21 AM This report has been signed electronically. Number of Addenda: 0 Note Initiated On: 08/30/2022 9:29 AM Scope Withdrawal Time: 0 hours 16 minutes 5 seconds  Total Procedure Duration: 0 hours 24 minutes 27 seconds  Estimated Blood Loss:  Estimated blood loss: none.      [facility]

## 2022-08-31 ENCOUNTER — Encounter: Payer: Self-pay | Admitting: Gastroenterology

## 2022-09-02 ENCOUNTER — Encounter: Payer: Self-pay | Admitting: Gastroenterology

## 2022-09-02 LAB — SURGICAL PATHOLOGY

## 2022-11-04 ENCOUNTER — Telehealth: Payer: Self-pay | Admitting: Family Medicine

## 2022-11-04 NOTE — Telephone Encounter (Signed)
Copied from CRM [id number]. Topic: Appointment Scheduling - Scheduling Inquiry for Clinic >> Nov 04, 2022 10:17 AM Haroldine Laws wrote: Reason for CRM: pt is scheduled for an annual medicare wellness vist at [facility].  He goes to [facility] Can this be verified and call the patient to let him know where to go.  He would prefer a phone visit.  CB#  [phone]

## 2022-11-25 ENCOUNTER — Ambulatory Visit (INDEPENDENT_AMBULATORY_CARE_PROVIDER_SITE_OTHER): Payer: 59

## 2022-11-25 VITALS — Ht 72.0 in | Wt 219.0 lb

## 2022-11-25 DIAGNOSIS — Z0000 Encounter for general adult medical examination without abnormal findings: Secondary | ICD-10-CM

## 2022-11-25 NOTE — Patient Instructions (Signed)
James Medina , Thank you for taking time to come for your Medicare Wellness Visit. I appreciate your ongoing commitment to your health goals. Please review the following plan we discussed and let me know if I can assist you in the future.   These are the goals we discussed:  Goals      Patient Stated     Patient states he plans to continue caring for his wife with recent cancer diagnosis.         This is a list of the screening recommended for you and due dates:  Health Maintenance  Topic Date Due   COVID-19 Vaccine (4 - 2023-24 season) 02/19/2022   Flu Shot  01/20/2023   Medicare Annual Wellness Visit  11/25/2023   Colon Cancer Screening  08/29/2024   DTaP/Tdap/Td vaccine (3 - Td or Tdap) 10/18/2027   Pneumonia Vaccine  Completed   Hepatitis C Screening  Completed   Zoster (Shingles) Vaccine  Completed   HPV Vaccine  Aged Out    Advanced directives: no  Conditions/risks identified: low falls risk  Next appointment: Follow up in one year for your annual wellness visit. 12/01/2023 @1 :30pm telephone  Preventive Care 65 Years and Older, Male  Preventive care refers to lifestyle choices and visits with your health care provider that can promote health and wellness. What does preventive care include? A yearly physical exam. This is also called an annual well check. Dental exams once or twice a year. Routine eye exams. Ask your health care provider how often you should have your eyes checked. Personal lifestyle choices, including: Daily care of your teeth and gums. Regular physical activity. Eating a healthy diet. Avoiding tobacco and drug use. Limiting alcohol use. Practicing safe sex. Taking low doses of aspirin every day. Taking vitamin and mineral supplements as recommended by your health care provider. What happens during an annual well check? The services and screenings done by your health care provider during your annual well check will depend on your age, overall  health, lifestyle risk factors, and family history of disease. Counseling  Your health care provider may ask you questions about your: Alcohol use. Tobacco use. Drug use. Emotional well-being. Home and relationship well-being. Sexual activity. Eating habits. History of falls. Memory and ability to understand (cognition). Work and work Astronomer. Screening  You may have the following tests or measurements: Height, weight, and BMI. Blood pressure. Lipid and cholesterol levels. These may be checked every 5 years, or more frequently if you are over 35 years old. Skin check. Lung cancer screening. You may have this screening every year starting at age 28 if you have a 30-pack-year history of smoking and currently smoke or have quit within the past 15 years. Fecal occult blood test (FOBT) of the stool. You may have this test every year starting at age 32. Flexible sigmoidoscopy or colonoscopy. You may have a sigmoidoscopy every 5 years or a colonoscopy every 10 years starting at age 76. Prostate cancer screening. Recommendations will vary depending on your family history and other risks. Hepatitis C blood test. Hepatitis B blood test. Sexually transmitted disease (STD) testing. Diabetes screening. This is done by checking your blood sugar (glucose) after you have not eaten for a while (fasting). You may have this done every 1-3 years. Abdominal aortic aneurysm (AAA) screening. You may need this if you are a current or former smoker. Osteoporosis. You may be screened starting at age 3 if you are at high risk. Talk with your health  care provider about your test results, treatment options, and if necessary, the need for more tests. Vaccines  Your health care provider may recommend certain vaccines, such as: Influenza vaccine. This is recommended every year. Tetanus, diphtheria, and acellular pertussis (Tdap, Td) vaccine. You may need a Td booster every 10 years. Zoster vaccine. You may  need this after age 77. Pneumococcal 13-valent conjugate (PCV13) vaccine. One dose is recommended after age 72. Pneumococcal polysaccharide (PPSV23) vaccine. One dose is recommended after age 25. Talk to your health care provider about which screenings and vaccines you need and how often you need them. This information is not intended to replace advice given to you by your health care provider. Make sure you discuss any questions you have with your health care provider. Document Released: 07/04/2015 Document Revised: 02/25/2016 Document Reviewed: 04/08/2015 Elsevier Interactive Patient Education  2017 [employer].  Fall Prevention in the Home Falls can cause injuries. They can happen to people of all ages. There are many things you can do to make your home safe and to help prevent falls. What can I do on the outside of my home? Regularly fix the edges of walkways and driveways and fix any cracks. Remove anything that might make you trip as you walk through a door, such as a raised step or threshold. Trim any bushes or trees on the path to your home. Use bright outdoor lighting. Clear any walking paths of anything that might make someone trip, such as rocks or tools. Regularly check to see if handrails are loose or broken. Make sure that both sides of any steps have handrails. Any raised decks and porches should have guardrails on the edges. Have any leaves, snow, or ice cleared regularly. Use sand or salt on walking paths during winter. Clean up any spills in your garage right away. This includes oil or grease spills. What can I do in the bathroom? Use night lights. Install grab bars by the toilet and in the tub and shower. Do not use towel bars as grab bars. Use non-skid mats or decals in the tub or shower. If you need to sit down in the shower, use a plastic, non-slip stool. Keep the floor dry. Clean up any water that spills on the floor as soon as it happens. Remove soap buildup in  the tub or shower regularly. Attach bath mats securely with double-sided non-slip rug tape. Do not have throw rugs and other things on the floor that can make you trip. What can I do in the bedroom? Use night lights. Make sure that you have a light by your bed that is easy to reach. Do not use any sheets or blankets that are too big for your bed. They should not hang down onto the floor. Have a firm chair that has side arms. You can use this for support while you get dressed. Do not have throw rugs and other things on the floor that can make you trip. What can I do in the kitchen? Clean up any spills right away. Avoid walking on wet floors. Keep items that you use a lot in easy-to-reach places. If you need to reach something above you, use a strong step stool that has a grab bar. Keep electrical cords out of the way. Do not use floor polish or wax that makes floors slippery. If you must use wax, use non-skid floor wax. Do not have throw rugs and other things on the floor that can make you trip. What can  I do with my stairs? Do not leave any items on the stairs. Make sure that there are handrails on both sides of the stairs and use them. Fix handrails that are broken or loose. Make sure that handrails are as long as the stairways. Check any carpeting to make sure that it is firmly attached to the stairs. Fix any carpet that is loose or worn. Avoid having throw rugs at the top or bottom of the stairs. If you do have throw rugs, attach them to the floor with carpet tape. Make sure that you have a light switch at the top of the stairs and the bottom of the stairs. If you do not have them, ask someone to add them for you. What else can I do to help prevent falls? Wear shoes that: Do not have high heels. Have rubber bottoms. Are comfortable and fit you well. Are closed at the toe. Do not wear sandals. If you use a stepladder: Make sure that it is fully opened. Do not climb a closed  stepladder. Make sure that both sides of the stepladder are locked into place. Ask someone to hold it for you, if possible. Clearly mark and make sure that you can see: Any grab bars or handrails. First and last steps. Where the edge of each step is. Use tools that help you move around (mobility aids) if they are needed. These include: Canes. Walkers. Scooters. Crutches. Turn on the lights when you go into a dark area. Replace any light bulbs as soon as they burn out. Set up your furniture so you have a clear path. Avoid moving your furniture around. If any of your floors are uneven, fix them. If there are any pets around you, be aware of where they are. Review your medicines with your doctor. Some medicines can make you feel dizzy. This can increase your chance of falling. Ask your doctor what other things that you can do to help prevent falls. This information is not intended to replace advice given to you by your health care provider. Make sure you discuss any questions you have with your health care provider. Document Released: 04/03/2009 Document Revised: 11/13/2015 Document Reviewed: 07/12/2014 Elsevier Interactive Patient Education  2017 [employer].

## 2022-11-25 NOTE — Progress Notes (Signed)
I connected with  Sharlotte Alamo on 11/25/22 by a audio enabled telemedicine application and verified that I am speaking with the correct person using two identifiers.  Patient Location: Home  Provider Location: Office/Clinic  I discussed the limitations of evaluation and management by telemedicine. The patient expressed understanding and agreed to proceed.  Subjective:   James Medina is a 77 y.o. male who presents for Medicare Annual/Subsequent preventive examination.  Review of Systems    Cardiac Risk Factors include: advanced age (>47men, >76 women);dyslipidemia;hypertension;male gender    Objective:    Today's Vitals   11/25/22 1330 11/25/22 1331  Weight: 219 lb (99.3 kg)   Height: 6' (1.829 m)   PainSc:  4    Body mass index is 29.7 kg/m.     11/25/2022    1:43 PM 08/30/2022    8:37 AM 11/03/2021   11:20 AM 10/23/2020   10:21 AM 08/27/2020    9:14 AM 09/20/2019    8:42 AM 09/14/2018    2:06 PM  Advanced Directives  Does Patient Have a Medical Advance Directive? No No Yes Yes Yes Yes No  Type of Best boy of [place];Living will Healthcare Power of [place];Living will Living will;Healthcare Power of [employer] Power of [place];Living will   Copy of Healthcare Power of Attorney in Chart?   No - copy requested No - copy requested No - copy requested No - copy requested   Would patient like information on creating a medical advance directive?       No - Patient declined    Current Medications (verified) Outpatient Encounter Medications as of 11/25/2022  Medication Sig   atorvastatin (LIPITOR) 10 MG tablet Take 1 tablet (10 mg total) by mouth every other day. In the evening   benazepril (LOTENSIN) 40 MG tablet Take 1 tablet (40 mg total) by mouth daily.   colchicine 0.6 MG tablet Take two pills at the onset of a gout attack; take one more pill one hour later   sildenafil (VIAGRA) 25 MG tablet Take 1-2 tablets by mouth daily as needed for  erectile dysfunction.   No facility-administered encounter medications on file as of 11/25/2022.    Allergies (verified) Patient has no known allergies.   History: Past Medical History:  Diagnosis Date   Alcoholism (HCC) 10/18/2017   ED (erectile dysfunction)    Hyperlipidemia    Hypertension    Impotence    Marijuana use 01/25/2018   OA (osteoarthritis)    hand   Obstructive sleep apnea 07/14/2018   Managed by pulmonologist   Sleep apnea    Past Surgical History:  Procedure Laterality Date   APPENDECTOMY     broken ankle     COLONOSCOPY WITH PROPOFOL N/A 08/27/2020   Procedure: COLONOSCOPY WITH PROPOFOL;  Surgeon: Pasty Spillers, MD;  Location: [facility] ENDOSCOPY;  Service: Endoscopy;  Laterality: N/A;   COLONOSCOPY WITH PROPOFOL N/A 08/30/2022   Procedure: COLONOSCOPY WITH PROPOFOL;  Surgeon: Toney Reil, MD;  Location[facility] END[facility]COPY;  Service: Gastroenterology;  Laterality: N/A;   FRACTURE SURGERY     Family History  Problem Relation Age of Onset   Cancer Mother        leukemia   Heart disease Father    Learning disabilities Maternal Uncle    Diabetes Brother    Stroke Brother    Diabetes Brother    Cancer Maternal Grandmother    Emphysema Maternal Grandfather    Heart disease Sister  pacemaker   COPD Neg Hx    Hypertension Neg Hx    Social History   Socioeconomic History   Marital status: Widowed    Spouse name: Not on file   Number of children: 2   Years of education: Not on file   Highest education level: Some college, no degree  Occupational History   Not on file  Tobacco Use   Smoking status: Former    Packs/day: 1.00    Years: 15.00    Additional pack years: 0.00    Total pack years: 15.00    Types: Cigars, Cigarettes    Quit date: 04/06/2012    Years since quitting: 10.6   Smokeless tobacco: Current    Types: Chew   Tobacco comments:    One can a day   Vaping Use   Vaping Use: Never used  Substance and Sexual Activity    Alcohol use: Yes    Alcohol/week: 3.0 - 4.0 standard drinks of alcohol    Types: 3 - 4 Cans of beer per week   Drug use: Not Currently   Sexual activity: Yes  Other Topics Concern   Not on file  Social History Narrative   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    Social Determinants of Health   Financial Resource Strain: Low Risk  (11/25/2022)   Overall Financial Resource Strain (CARDIA)    Difficulty of Paying Living Expenses: Not hard at all  Food Insecurity: No Food Insecurity (11/25/2022)   Hunger Vital Sign    Worried About Running Out of Food in the Last Year: Never true    Ran Out of Food in the Last Year: Never true  Transportation Needs: No Transportation Needs (11/25/2022)   PRAPARE - Administrator, Civil Service (Medical): No    Lack of Transportation (Non-Medical): No  Physical Activity: Sufficiently Active (11/25/2022)   Exercise Vital Sign    Days of Exercise per Week: 4 days    Minutes of Exercise per Session: 40 min  Stress: No Stress Concern Present (11/25/2022)   [employer] of Occupational Health - Occupational Stress Questionnaire    Feeling of Stress : Not at all  Social Connections: Socially Isolated (11/25/2022)   Social Connection and Isolation Panel [NHANES]    Frequency of Communication with Friends and Family: More than three times a week    Frequency of Social Gatherings with Friends and Family: Once a week    Attends Religious Services: Never    Database administrator or Organizations: No    Attends Banker Meetings: Never    Marital Status: Widowed    Tobacco Counseling Ready to quit: Not Answered Counseling given: Not Answered Tobacco comments: One can a day    Clinical Intake:  Pre-visit preparation completed: Yes  Pain : 0-10 Pain Score: 4  Pain Type: Chronic pain Pain Location: Knee (both) Pain Descriptors / Indicators: Aching Pain Onset: More than a month ago Pain Frequency:  Constant Pain Relieving Factors: IBU, injections,icy hot  Pain Relieving Factors: IBU, injections,icy hot  BMI - recorded: 29.7 Nutritional Status: BMI 25 -29 Overweight Nutritional Risks: None Diabetes: No  How often do you need to have someone help you when you read instructions, pamphlets, or other written materials from your doctor or pharmacy?: 1 - Never  Diabetic?no  Interpreter Needed?: No  Comments: lives alone;widow last April Information entered by :: B.Karen Kinnard,LPN   Activities of Daily Living  11/25/2022    1:44 PM 07/09/2022    1:24 PM  In your present state of health, do you have any difficulty performing the following activities:  Hearing? 1 0  Vision? 1 0  Difficulty concentrating or making decisions? 1 0  Walking or climbing stairs? 0 1  Dressing or bathing? 1 0  Doing errands, shopping? 1 0  Preparing Food and eating ? N   Using the Toilet? N   In the past six months, have you accidently leaked urine? N   Do you have problems with loss of bowel control? N   Managing your Medications? N   Managing your Finances? N   Housekeeping or managing your Housekeeping? N     Patient Care Team: Danelle Berry, PA-C as PCP - General (Family Medicine)  Indicate any recent Medical Services you may have received from other than [facility] providers in the past year (date may be approximate).     Assessment:   This is a routine wellness examination for Asiah.  Hearing/Vision screen Hearing Screening - Comments:: Adequate  hearing Vision Screening - Comments:: Inadequate vision w/glasses due to cataracts:blurry [place]   Dietary issues and exercise activities discussed: Current Exercise Habits: The patient has a physically strenuous job, but has no regular exercise apart from work., Exercise limited by: orthopedic condition(s)   Goals Addressed             This Visit's Progress    Patient Stated   On track    Patient states he plans to continue caring for  his wife with recent cancer diagnosis.        Depression Screen    11/25/2022    1:40 PM 07/09/2022    1:24 PM 04/30/2022    9:21 AM 04/02/2022   10:11 AM 12/31/2021    8:14 AM 11/03/2021   11:19 AM 06/30/2021    8:37 AM  PHQ 2/9 Scores  PHQ - 2 Score 0 0 0 0 0 1 0  PHQ- 9 Score  0 0 0 0  0    Fall Risk    11/25/2022    1:36 PM 07/09/2022    1:24 PM 04/30/2022    9:21 AM 04/02/2022   10:11 AM 03/18/2022    2:13 PM  Fall Risk   Falls in the past year? 0 0 0 0 0  Number falls in past yr: 0 0 0 0 0  Injury with Fall? 0 0 0 0 0  Risk for fall due to : No Fall Risks No Fall Risks No Fall Risks No Fall Risks No Fall Risks  Follow up Education provided;Falls prevention discussed Falls prevention discussed;Education provided;Falls evaluation completed Falls prevention discussed;Education provided;Falls evaluation completed Falls prevention discussed;Education provided Falls prevention discussed;Education provided    FALL RISK PREVENTION PERTAINING TO THE HOME:  Any stairs in or around the home? No  If so, are there any without handrails? No  Home free of loose throw rugs in walkways, pet beds, electrical cords, etc? Yes  Adequate lighting in your home to reduce risk of falls? Yes   ASSISTIVE DEVICES UTILIZED TO PREVENT FALLS:  Life alert? No  Use of a cane, walker or w/c? Yes  Grab bars in the bathroom? Yes  Shower chair or bench in shower? Yes  Elevated toilet seat or a handicapped toilet? Yes    Cognitive Function:        11/25/2022    1:47 PM 09/20/2019    8:37 AM 09/14/2018  2:13 PM  6CIT Screen  What Year? 0 points 0 points 0 points  What month? 0 points 0 points 0 points  What time? 0 points 0 points 0 points  Count back from 20 0 points 0 points 0 points  Months in reverse 0 points 0 points 0 points  Repeat phrase 0 points 2 points 0 points  Total Score 0 points 2 points 0 points    Immunizations Immunization History  Administered Date(s) Administered   Fluad  Quad(high Dose 65+) 03/22/2019, 03/13/2020, 03/18/2022   Influenza, High Dose Seasonal PF 03/25/2017   Influenza-Unspecified 04/21/2021   Moderna Sars-Covid-2 Vaccination 10/26/2019, 11/23/2019, 05/27/2020   PNEUMOCOCCAL CONJUGATE-20 06/30/2021   Tdap 06/21/2009, 10/17/2017   Zoster Recombinat (Shingrix) 05/02/2019, 07/21/2019    TDAP status: Up to date  Flu Vaccine status: Up to date  Pneumococcal vaccine status: Up to date  Covid-19 vaccine status: Completed vaccines  Qualifies for Shingles Vaccine? Yes   Zostavax completed Yes   Shingrix Completed?: Yes  Screening Tests Health Maintenance  Topic Date Due   COVID-19 Vaccine (4 - 2023-24 season) 02/19/2022   INFLUENZA VACCINE  01/20/2023   Medicare Annual Wellness (AWV)  11/25/2023   Colonoscopy  08/29/2024   DTaP/Tdap/Td (3 - Td or Tdap) 10/18/2027   Pneumonia Vaccine 26+ Years old  Completed   Hepatitis C Screening  Completed   Zoster Vaccines- Shingrix  Completed   HPV VACCINES  Aged Out    Health Maintenance  Health Maintenance Due  Topic Date Due   COVID-19 Vaccine (4 - 2023-24 season) 02/19/2022    Colorectal cancer screening: Type of screening: Colonoscopy. Completed yes. Repeat every 5 years  Lung Cancer Screening: (Low Dose CT Chest recommended if Age 46-80 years, 30 pack-year currently smoking OR have quit w/in 15years.) does qualify.   Lung Cancer Screening Referral: no pt declines  Additional Screening:  Hepatitis C Screening: does not qualify; Completed yes  Vision Screening: Recommended annual ophthalmology exams for early detection of glaucoma and other disorders of the eye. Is the patient up to date with their annual eye exam?  Yes  Who is the provider or what is the name of the office in which the patient attends annual eye exams? [facility] If pt is not established with a provider, would they like to be referred to a provider to establish care? No .   Dental Screening: Recommended annual  dental exams for proper oral hygiene  Community Resource Referral / Chronic Care Management: CRR required this visit?  No   CCM required this visit?  No      Plan:     I have personally reviewed and noted the following in the patient's chart:   Medical and social history Use of alcohol, tobacco or illicit drugs  Current medications and supplements including opioid prescriptions. Patient is not currently taking opioid prescriptions. Functional ability and status Nutritional status Physical activity Advanced directives List of other physicians Hospitalizations, surgeries, and ER visits in previous 12 months Vitals Screenings to include cognitive, depression, and falls Referrals and appointments  In addition, I have reviewed and discussed with patient certain preventive protocols, quality metrics, and best practice recommendations. A written personalized care plan for preventive services as well as general preventive health recommendations were provided to patient.    Sue Lush, LPN   4/0/9811   Nurse Notes: The patient states he is doing well and has no concerns or questions at this time.

## 2023-01-07 ENCOUNTER — Ambulatory Visit: Payer: Medicare Other | Admitting: Family Medicine

## 2023-01-11 ENCOUNTER — Other Ambulatory Visit: Payer: Self-pay | Admitting: Internal Medicine

## 2023-01-11 DIAGNOSIS — I10 Essential (primary) hypertension: Secondary | ICD-10-CM

## 2023-01-12 ENCOUNTER — Ambulatory Visit: Payer: Medicare Other | Admitting: Family Medicine

## 2023-01-12 NOTE — Telephone Encounter (Signed)
Requested Prescriptions  Pending Prescriptions Disp Refills   benazepril (LOTENSIN) 40 MG tablet [Pharmacy Med Name: benazepril 40 mg tablet] 90 tablet 0    Sig: TAKE 1 TABLET BY MOUTH DAILY     Cardiovascular:  ACE Inhibitors Failed - 01/11/2023  2:42 PM      Failed - Cr in normal range and within 180 days    Creat  Date Value Ref Range Status  07/09/2022 0.65 (L) 0.70 - 1.28 mg/dL Final         Failed - K in normal range and within 180 days    Potassium  Date Value Ref Range Status  07/09/2022 3.6 3.5 - 5.3 mmol/L Final         Passed - Patient is not pregnant      Passed - Last BP in normal range    BP Readings from Last 1 Encounters:  08/30/22 132/79         Passed - Valid encounter within last 6 months    Recent Outpatient Visits           6 months ago Essential hypertension   [facility] Margarita Mail, DO   8 months ago Essential hypertension   [facility] Danelle Berry, PA-C   9 months ago Essential hypertension   [facility] Danelle Berry, PA-C   10 months ago Pain and swelling of toe of right foot   [facility] Danelle Berry, PA-C   1 year ago Essential hypertension   [facility] Margarita Mail, DO       Future Appointments             In 6 days Danelle Berry, PA-C [facility], [facility]

## 2023-01-18 ENCOUNTER — Ambulatory Visit: Payer: Medicare Other | Admitting: Family Medicine

## 2023-01-18 DIAGNOSIS — R7303 Prediabetes: Secondary | ICD-10-CM

## 2023-01-18 DIAGNOSIS — I10 Essential (primary) hypertension: Secondary | ICD-10-CM

## 2023-01-18 DIAGNOSIS — E782 Mixed hyperlipidemia: Secondary | ICD-10-CM

## 2023-01-18 DIAGNOSIS — Z5181 Encounter for therapeutic drug level monitoring: Secondary | ICD-10-CM

## 2023-01-18 NOTE — Progress Notes (Unsigned)
Name: James Medina   MRN: [id number]    DOB: Oct 26, 1945   Date:01/19/2023       Progress Note  Subjective  Chief Complaint  Follow Up  HPI  Alcohol daily use: drinks vodka with sprite daily , liver enzymes slightly up, advised to cut down on amount  HTN: taking medication as prescribed, Benazepril, usually 130's occasionally goes to 140. No side effects. Denies chest pain, palpitation or SOB  Dyslipidemia:forgets to take medication at night, advised to take it during the day  Breast tenderness: going on for a few weeks, improving now, no masses, only when he pushes against his breast, no nipple discharge or bleeding, bilateral  Elevated HCT and also OSA: discussed importance of resuming CPAP , he cannot tolerate it, we will recheck level. He said seen by hematologist years ago  Controlled gout: takes colchicine prn   Patient Active Problem List   Diagnosis Date Noted   History of colonic polyps 08/30/2022   Rectal polyp 08/30/2022   Polyp of descending colon 08/30/2022   History of gout 12/31/2021   Elevated LFTs 10/24/2019   Prediabetes 08/03/2019   Obstructive sleep apnea 07/14/2018   Pulmonary nodule 07/14/2018   Osteoarthritis of knee 04/04/2018   Thoracic aortic aneurysm without rupture (HCC) 01/25/2018   Alcohol dependence, daily use (HCC) 10/18/2017   Osteoarthritis of both hands 10/18/2017   Gout 03/25/2017   Elevated hemoglobin (HCC) 07/13/2016   Umbilical hernia without obstruction or gangrene 04/26/2016   Impaired fasting glucose 09/16/2015   Macrocytosis without anemia 09/16/2015   Hematospermia 09/05/2015   Abnormal CBC 06/08/2015   Mixed hyperlipidemia    Essential hypertension    ED (erectile dysfunction)    Impotence     Past Surgical History:  Procedure Laterality Date   APPENDECTOMY     broken ankle     COLONOSCOPY WITH PROPOFOL N/A 08/27/2020   Procedure: COLONOSCOPY WITH PROPOFOL;  Surgeon: Pasty Spillers, MD;  Location: [facility]  ENDOSCOPY;  Service: Endoscopy;  Laterality: N/A;   COLONOSCOPY WITH PROPOFOL N/A 08/30/2022   Procedure: COLONOSCOPY WITH PROPOFOL;  Surgeon: Toney Reil, MD;  Location[facility] ENDOSCOPY;  Service: Gastroenterology;  Laterality: N/A;   FRACTURE SURGERY      Family History  Problem Relation Age of Onset   Cancer Mother        leukemia   Heart disease Father    Learning disabilities Maternal Uncle    Diabetes Brother    Stroke Brother    Diabetes Brother    Cancer Maternal Grandmother    Emphysema Maternal Grandfather    Heart disease Sister        pacemaker   COPD Neg Hx    Hypertension Neg Hx     Social History   Tobacco Use   Smoking status: Former    Current packs/day: 0.00    Average packs/day: 1 pack/day for 15.0 years (15.0 ttl pk-yrs)    Types: Cigars, Cigarettes    Start date: 04/06/1997    Quit date: 04/06/2012    Years since quitting: 10.7   Smokeless tobacco: Current    Types: Chew   Tobacco comments:    One can a day   Substance Use Topics   Alcohol use: Yes    Alcohol/week: 3.0 - 4.0 standard drinks of alcohol    Types: 3 - 4 Cans of beer per week     Current Outpatient Medications:    atorvastatin (LIPITOR) 10 MG tablet, Take 1 tablet (10  mg total) by mouth every other day. In the evening, Disp: 45 tablet, Rfl: 3   benazepril (LOTENSIN) 40 MG tablet, TAKE 1 TABLET BY MOUTH DAILY, Disp: 90 tablet, Rfl: 0   colchicine 0.6 MG tablet, Take two pills at the onset of a gout attack; take one more pill one hour later, Disp: 3 tablet, Rfl: 5   sildenafil (VIAGRA) 25 MG tablet, Take 1-2 tablets by mouth daily as needed for erectile dysfunction., Disp: 10 tablet, Rfl: 2  No Known Allergies  I personally reviewed active problem list, medication list, allergies, family history, social history, health maintenance with the patient/caregiver today.   ROS  Constitutional: Negative for fever or weight change.  Respiratory: Negative for cough and shortness of  breath.   Cardiovascular: Negative for chest pain or palpitations.  Gastrointestinal: Negative for abdominal pain, no bowel changes.  Musculoskeletal: Negative for gait problem or joint swelling.  Skin: Negative for rash.  Neurological: Negative for dizziness or headache.  No other specific complaints in a complete review of systems (except as listed in HPI above).   Objective  Vitals:   01/19/23 0854  BP: 132/70  Pulse: 84  Resp: 16  Temp: 98 F (36.7 C)  TempSrc: Oral  SpO2: 94%  Weight: 214 lb 1.6 oz (97.1 kg)  Height: 6' (1.829 m)    Body mass index is 29.04 kg/m.  Physical Exam  Constitutional: Patient appears well-developed and well-nourished. Obese  No distress.  HEENT: head atraumatic, normocephalic, pupils equal and reactive to light, neck supple Breast: no gynecomastia noticed, slightly tender to touch, no redness or masses  Cardiovascular: Normal rate, regular rhythm and normal heart sounds.  No murmur heard. No BLE edema. Pulmonary/Chest: Effort normal and breath sounds normal. No respiratory distress. Abdominal: Soft.  There is no tenderness. Psychiatric: Patient has a normal mood and affect. behavior is normal. Judgment and thought content normal.    PHQ2/9:    01/19/2023    8:56 AM 11/25/2022    1:40 PM 07/09/2022    1:24 PM 04/30/2022    9:21 AM 04/02/2022   10:11 AM  Depression screen PHQ 2/9  Decreased Interest 0 0 0 0 0  Down, Depressed, Hopeless 0 0 0 0 0  PHQ - 2 Score 0 0 0 0 0  Altered sleeping 0  0 0 0  Tired, decreased energy 0  0 0 0  Change in appetite 0  0 0 0  Feeling bad or failure about yourself  0  0 0 0  Trouble concentrating 0  0 0 0  Moving slowly or fidgety/restless 0  0 0 0  Suicidal thoughts 0  0 0 0  PHQ-9 Score 0  0 0 0  Difficult doing work/chores   Not difficult at all Not difficult at all Not difficult at all    phq 9 is negative   Fall Risk:    01/19/2023    8:56 AM 11/25/2022    1:36 PM 07/09/2022    1:24 PM  04/30/2022    9:21 AM 04/02/2022   10:11 AM  Fall Risk   Falls in the past year? 0 0 0 0 0  Number falls in past yr:  0 0 0 0  Injury with Fall?  0 0 0 0  Risk for fall due to : No Fall Risks No Fall Risks No Fall Risks No Fall Risks No Fall Risks  Follow up Falls prevention discussed Education provided;Falls prevention discussed Falls prevention discussed;Education provided;Falls  evaluation completed Falls prevention discussed;Education provided;Falls evaluation completed Falls prevention discussed;Education provided      Functional Status Survey: Is the patient deaf or have difficulty hearing?: No Does the patient have difficulty seeing, even when wearing glasses/contacts?: Yes Does the patient have difficulty concentrating, remembering, or making decisions?: No Does the patient have difficulty walking or climbing stairs?: Yes Does the patient have difficulty dressing or bathing?: No Does the patient have difficulty doing errands alone such as visiting a doctor's office or shopping?: No    Assessment & Plan  1. Essential hypertension  - benazepril (LOTENSIN) 40 MG tablet; Take 1 tablet (40 mg total) by mouth daily.  Dispense: 90 tablet; Refill: 1 - COMPLETE METABOLIC PANEL WITH GFR  2. Alcohol dependence, daily use (HCC)  - Vitamin B1  3. Elevated LFTs  - COMPLETE METABOLIC PANEL WITH GFR  4. Prediabetes  Discussed low carb diet  5. Mixed hyperlipidemia  Advised to take it with bp medication to increase compliance  6. Controlled gout  Takes colchicine prn  7. OSA (obstructive sleep apnea)  Non compliant,seen by pulmonologist years ago   8. Elevated hemoglobin (HCC)  - CBC with Differential/Platelet - B12 and Folate Panel  9. Breast tenderness in male  We will monitor for now  10. Hyperglycemia  - Hemoglobin A1c

## 2023-01-19 ENCOUNTER — Ambulatory Visit (INDEPENDENT_AMBULATORY_CARE_PROVIDER_SITE_OTHER): Payer: 59 | Admitting: Family Medicine

## 2023-01-19 ENCOUNTER — Encounter: Payer: Self-pay | Admitting: Family Medicine

## 2023-01-19 VITALS — BP 132/70 | HR 84 | Temp 98.0°F | Resp 16 | Ht 72.0 in | Wt 214.1 lb

## 2023-01-19 DIAGNOSIS — E782 Mixed hyperlipidemia: Secondary | ICD-10-CM

## 2023-01-19 DIAGNOSIS — R739 Hyperglycemia, unspecified: Secondary | ICD-10-CM

## 2023-01-19 DIAGNOSIS — F1020 Alcohol dependence, uncomplicated: Secondary | ICD-10-CM | POA: Diagnosis not present

## 2023-01-19 DIAGNOSIS — M109 Gout, unspecified: Secondary | ICD-10-CM

## 2023-01-19 DIAGNOSIS — R7989 Other specified abnormal findings of blood chemistry: Secondary | ICD-10-CM

## 2023-01-19 DIAGNOSIS — N644 Mastodynia: Secondary | ICD-10-CM

## 2023-01-19 DIAGNOSIS — R7303 Prediabetes: Secondary | ICD-10-CM

## 2023-01-19 DIAGNOSIS — I10 Essential (primary) hypertension: Secondary | ICD-10-CM | POA: Diagnosis not present

## 2023-01-19 DIAGNOSIS — G4733 Obstructive sleep apnea (adult) (pediatric): Secondary | ICD-10-CM

## 2023-01-19 DIAGNOSIS — D582 Other hemoglobinopathies: Secondary | ICD-10-CM

## 2023-01-19 MED ORDER — BENAZEPRIL HCL 40 MG PO TABS: 40.0000 mg | ORAL_TABLET | Freq: Every day | ORAL | 1 refills | Status: DC

## 2023-01-20 LAB — CBC WITH DIFFERENTIAL/PLATELET
Absolute Monocytes: 545 cells/uL (ref 200–950)
Basophils Absolute: 81 cells/uL (ref 0–200)
Basophils Relative: 0.8 %
Eosinophils Absolute: 232 cells/uL (ref 15–500)
Eosinophils Relative: 2.3 %
HCT: 57 % — ABNORMAL HIGH (ref 38.5–50.0)
Hemoglobin: 20.7 g/dL — ABNORMAL HIGH (ref 13.2–17.1)
Lymphs Abs: 1697 cells/uL (ref 850–3900)
MCH: 41.2 pg — ABNORMAL HIGH (ref 27.0–33.0)
MCHC: 36.3 g/dL — ABNORMAL HIGH (ref 32.0–36.0)
MCV: 113.3 fL — ABNORMAL HIGH (ref 80.0–100.0)
MPV: 9 fL (ref 7.5–12.5)
Monocytes Relative: 5.4 %
Neutro Abs: 7545 cells/uL (ref 1500–7800)
Neutrophils Relative %: 74.7 %
Platelets: 243 10*3/uL (ref 140–400)
RBC: 5.03 10*6/uL (ref 4.20–5.80)
RDW: 13.8 % (ref 11.0–15.0)
Total Lymphocyte: 16.8 %
WBC: 10.1 10*3/uL (ref 3.8–10.8)

## 2023-01-20 LAB — COMPLETE METABOLIC PANEL WITH GFR
AG Ratio: 1.2 (calc) (ref 1.0–2.5)
ALT: 23 U/L (ref 9–46)
AST: 45 U/L — ABNORMAL HIGH (ref 10–35)
Albumin: 3.7 g/dL (ref 3.6–5.1)
Alkaline phosphatase (APISO): 137 U/L (ref 35–144)
BUN/Creatinine Ratio: 8 (calc) (ref 6–22)
BUN: 5 mg/dL — ABNORMAL LOW (ref 7–25)
CO2: 30 mmol/L (ref 20–32)
Calcium: 8.8 mg/dL (ref 8.6–10.3)
Chloride: 95 mmol/L — ABNORMAL LOW (ref 98–110)
Creat: 0.65 mg/dL — ABNORMAL LOW (ref 0.70–1.28)
Globulin: 3.2 g/dL (calc) (ref 1.9–3.7)
Glucose, Bld: 134 mg/dL — ABNORMAL HIGH (ref 65–99)
Potassium: 3.4 mmol/L — ABNORMAL LOW (ref 3.5–5.3)
Sodium: 136 mmol/L (ref 135–146)
Total Bilirubin: 1.1 mg/dL (ref 0.2–1.2)
Total Protein: 6.9 g/dL (ref 6.1–8.1)
eGFR: 99 mL/min/{1.73_m2} (ref >=60)

## 2023-01-20 LAB — URIC ACID: Uric Acid, Serum: 6.7 mg/dL (ref 4.0–8.0)

## 2023-03-24 ENCOUNTER — Telehealth: Payer: Self-pay | Admitting: *Deleted

## 2023-03-24 ENCOUNTER — Other Ambulatory Visit: Payer: Self-pay | Admitting: Internal Medicine

## 2023-03-24 DIAGNOSIS — D582 Other hemoglobinopathies: Secondary | ICD-10-CM

## 2023-03-24 NOTE — Telephone Encounter (Signed)
Dr.Sowles last seen him on July. Could you place referral?

## 2023-03-24 NOTE — Telephone Encounter (Signed)
Patient called for test results and reviewed:  We need to place referral if wiling to go.   I recommend evaluation by hematologist due to high hemoglobin levels Please start taking Vitamin 1 and also folic acid otc daily A1C is in the pre diabetes range Increase potassium in your diet Uric acid also slightly higher, goal is below 6 , we can send rx for allopurinol if you are willing to take another medication daily   *Patient states he will need a referral to hematology- it has been a couple years since seen.  *Patient asked for dosing of recommended vitamins- vs Multivitamin ( he asked Centrum over 48 would give what is needed) *Patient states he has not had Gout flare and has colchicine if needed- will discuss need for Allopurinol at next appointment

## 2023-03-25 ENCOUNTER — Inpatient Hospital Stay: Payer: 59

## 2023-03-25 ENCOUNTER — Inpatient Hospital Stay: Payer: 59 | Attending: Oncology | Admitting: Oncology

## 2023-03-25 ENCOUNTER — Encounter: Payer: Self-pay | Admitting: Oncology

## 2023-03-25 VITALS — BP 94/61 | HR 85 | Temp 98.3°F | Resp 18 | Ht 72.0 in | Wt 214.0 lb

## 2023-03-25 DIAGNOSIS — D751 Secondary polycythemia: Secondary | ICD-10-CM

## 2023-03-25 DIAGNOSIS — G4730 Sleep apnea, unspecified: Secondary | ICD-10-CM | POA: Diagnosis not present

## 2023-03-25 DIAGNOSIS — Z79899 Other long term (current) drug therapy: Secondary | ICD-10-CM | POA: Insufficient documentation

## 2023-03-25 DIAGNOSIS — G4733 Obstructive sleep apnea (adult) (pediatric): Secondary | ICD-10-CM | POA: Diagnosis not present

## 2023-03-25 DIAGNOSIS — D7589 Other specified diseases of blood and blood-forming organs: Secondary | ICD-10-CM | POA: Insufficient documentation

## 2023-03-25 LAB — CBC WITH DIFFERENTIAL/PLATELET
Abs Immature Granulocytes: 0.04 10*3/uL (ref 0.00–0.07)
Basophils Absolute: 0.1 10*3/uL (ref 0.0–0.1)
Basophils Relative: 1 %
Eosinophils Absolute: 0.2 10*3/uL (ref 0.0–0.5)
Eosinophils Relative: 2 %
HCT: 46.7 % (ref 39.0–52.0)
Hemoglobin: 16.9 g/dL (ref 13.0–17.0)
Immature Granulocytes: 0 %
Lymphocytes Relative: 19 %
Lymphs Abs: 2 10*3/uL (ref 0.7–4.0)
MCH: 40.8 pg — ABNORMAL HIGH (ref 26.0–34.0)
MCHC: 36.2 g/dL — ABNORMAL HIGH (ref 30.0–36.0)
MCV: 112.8 fL — ABNORMAL HIGH (ref 80.0–100.0)
Monocytes Absolute: 0.6 10*3/uL (ref 0.1–1.0)
Monocytes Relative: 5 %
Neutro Abs: 7.7 10*3/uL (ref 1.7–7.7)
Neutrophils Relative %: 73 %
Platelets: 158 10*3/uL (ref 150–400)
RBC: 4.14 MIL/uL — ABNORMAL LOW (ref 4.22–5.81)
RDW: 13.5 % (ref 11.5–15.5)
WBC: 10.5 10*3/uL (ref 4.0–10.5)
nRBC: 0 % (ref 0.0–0.2)

## 2023-03-25 MED ORDER — FOLIC ACID 1 MG PO TABS: 1.0000 mg | ORAL_TABLET | Freq: Every day | ORAL | 0 refills | Status: DC

## 2023-03-25 NOTE — Addendum Note (Signed)
Addended by: Rickard Patience on: 03/25/2023 08:37 PM   Modules accepted: Orders

## 2023-03-25 NOTE — Assessment & Plan Note (Signed)
Repeat cbc today showed improved hemoglobin and hct.  No need for phlebotomy.

## 2023-03-25 NOTE — Assessment & Plan Note (Addendum)
Folate level is low in July. Macrocytosis is likely due to folate deficiency and alcohol use Recommend patient to take folic acid supplementation.  Repeat cbc in 3 months

## 2023-03-25 NOTE — Assessment & Plan Note (Signed)
Recommend CPAP utilization. Refer to pulmonology.

## 2023-03-25 NOTE — Progress Notes (Addendum)
Hematology/Oncology Consult note [phone] [phone]        REFERRING PROVIDER: Margarita Mail, DO   CHIEF COMPLAINTS/REASON FOR VISIT:  Evaluation of erythrocytosis   ASSESSMENT & PLAN:   Secondary erythrocytosis Repeat cbc today showed improved hemoglobin and hct.  No need for phlebotomy.    Macrocytosis without anemia Folate level is low in July. Macrocytosis is likely due to folate deficiency and alcohol use Recommend patient to take folic acid supplementation.  Repeat cbc in 3 months   Obstructive sleep apnea Recommend CPAP utilization. Refer to pulmonology.    Orders Placed This Encounter  Procedures   CBC with Differential/Platelet    Standing Status:   Future    Number of Occurrences:   1    Standing Expiration Date:   03/24/2024   Ambulatory referral to Pulmonology    Referral Priority:   Routine    Referral Type:   Consultation    Referral Reason:   Specialty Services Required    Requested Specialty:   Pulmonary Disease    Number of Visits Requested:   1   Follow up in 3 months.  All questions were answered. The patient knows to call the clinic with any problems, questions or concerns.  Rickard Patience, MD, PhD [facility] Hematology Oncology 03/25/2023   HISTORY OF PRESENTING ILLNESS:   James Medina is a  77 y.o.  male with PMH listed below was seen in consultation at the request of  Margarita Mail, DO  for evaluation of erythrocytosis Patient was last seen in 2019, erythrocytosis work up showed JAK2 V617F mutation negative, with reflex to other mutations CALR, MPL, JAK 2 Ex 12-15 mutations negative, negative BCR-ABL1. Erythrocytosis was felt to be due to obstructive OSA. He lost follow up after his appointment on 03/31/2018 .  He was referred back to our clinic to establish care of work up and erythrocytosis.  Patient reports that he has not been using CPAP machine.  01/19/23 cbc showed hemoglobin of 20.7, Hct 57.  He  denies dizziness, focal deficit, chest pain, SOB.      MEDICAL HISTORY:  Past Medical History:  Diagnosis Date   Alcoholism (HCC) 10/18/2017   ED (erectile dysfunction)    Hyperlipidemia    Hypertension    Impotence    Marijuana use 01/25/2018   OA (osteoarthritis)    hand   Obstructive sleep apnea 07/14/2018   Managed by pulmonologist   Sleep apnea     SURGICAL HISTORY: Past Surgical History:  Procedure Laterality Date   APPENDECTOMY     broken ankle     COLONOSCOPY WITH PROPOFOL N/A 08/27/2020   Procedure: COLONOSCOPY WITH PROPOFOL;  Surgeon: Pasty Spillers, MD;  Location: [facility] ENDOSCOPY;  Service: Endoscopy;  Laterality: N/A;   COLONOSCOPY WITH PROPOFOL N/A 08/30/2022   Procedure: COLONOSCOPY WITH PROPOFOL;  Surgeon: Toney Reil, MD;  Location: [facility] ENDOSCOPY;  Service: Gastroenterology;  Laterality: N/A;   FRACTURE SURGERY      SOCIAL HISTORY: Social History   Socioeconomic History   Marital status: Widowed    Spouse name: Not on file   Number of children: 2   Years of education: Not on file   Highest education level: Some college, no degree  Occupational History   Not on file  Tobacco Use   Smoking status: Former    Current packs/day: 0.00    Average packs/day: 1 pack/day for 15.0 years (15.0 ttl pk-yrs)    Types: Cigars, Cigarettes  Start date: 04/06/1997    Quit date: 04/06/2012    Years since quitting: 10.9   Smokeless tobacco: Current    Types: Chew   Tobacco comments:    One can a day   Vaping Use   Vaping status: Never Used  Substance and Sexual Activity   Alcohol use: Not Currently    Comment: 2-3 drinks a night (vodka and sprite)   Drug use: Not Currently   Sexual activity: Yes  Other Topics Concern   Not on file  Social History Narrative   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    Social Determinants of Health   Financial Resource Strain: Low Risk  (11/25/2022)   Overall Financial Resource Strain  (CARDIA)    Difficulty of Paying Living Expenses: Not hard at all  Food Insecurity: No Food Insecurity (11/25/2022)   Hunger Vital Sign    Worried About Running Out of Food in the Last Year: Never true    Ran Out of Food in the Last Year: Never true  Transportation Needs: No Transportation Needs (11/25/2022)   PRAPARE - Administrator, Civil Service (Medical): No    Lack of Transportation (Non-Medical): No  Physical Activity: Sufficiently Active (11/25/2022)   Exercise Vital Sign    Days of Exercise per Week: 4 days    Minutes of Exercise per Session: 40 min  Stress: No Stress Concern Present (11/25/2022)   [employer] of Occupational Health - Occupational Stress Questionnaire    Feeling of Stress : Not at all  Social Connections: Socially Isolated (11/25/2022)   Social Connection and Isolation Panel [NHANES]    Frequency of Communication with Friends and Family: More than three times a week    Frequency of Social Gatherings with Friends and Family: Once a week    Attends Religious Services: Never    Database administrator or Organizations: No    Attends Banker Meetings: Never    Marital Status: Widowed  Intimate Partner Violence: Not At Risk (11/25/2022)   Humiliation, Afraid, Rape, and Kick questionnaire    Fear of Current or Ex-Partner: No    Emotionally Abused: No    Physically Abused: No    Sexually Abused: No    FAMILY HISTORY: Family History  Problem Relation Age of Onset   Cancer Mother        leukemia   Leukemia Mother    Heart disease Father    Heart disease Sister        pacemaker   Diabetes Brother    Stroke Brother    Diabetes Brother    Learning disabilities Maternal Uncle    Cancer Maternal Grandmother    Breast cancer Maternal Grandmother    Emphysema Maternal Grandfather    COPD Neg Hx    Hypertension Neg Hx     ALLERGIES:  has No Known Allergies.  MEDICATIONS:  Current Outpatient Medications  Medication Sig Dispense Refill    atorvastatin (LIPITOR) 10 MG tablet Take 1 tablet (10 mg total) by mouth every other day. In the evening 45 tablet 3   benazepril (LOTENSIN) 40 MG tablet Take 1 tablet (40 mg total) by mouth daily. 90 tablet 1   colchicine 0.6 MG tablet Take two pills at the onset of a gout attack; take one more pill one hour later 3 tablet 5   sildenafil (VIAGRA) 25 MG tablet Take 1-2 tablets by mouth daily as needed for erectile dysfunction. 10 tablet  2   No current facility-administered medications for this visit.    Review of Systems  Constitutional:  Positive for fatigue. Negative for appetite change, chills, fever and unexpected weight change.  HENT:   Negative for hearing loss and voice change.   Eyes:  Negative for eye problems and icterus.  Respiratory:  Negative for chest tightness, cough and shortness of breath.   Cardiovascular:  Negative for chest pain and leg swelling.  Gastrointestinal:  Negative for abdominal distention and abdominal pain.  Endocrine: Negative for hot flashes.  Genitourinary:  Negative for difficulty urinating, dysuria and frequency.   Musculoskeletal:  Negative for arthralgias.  Skin:  Negative for itching and rash.  Neurological:  Negative for light-headedness and numbness.  Hematological:  Negative for adenopathy. Does not bruise/bleed easily.  Psychiatric/Behavioral:  Negative for confusion.    PHYSICAL EXAMINATION: Vitals:   03/25/23 1115  BP: 94/61  Pulse: 85  Resp: 18  Temp: 98.3 F (36.8 C)   Filed Weights   03/25/23 1115  Weight: 214 lb (97.1 kg)    Physical Exam Constitutional:      General: He is not in acute distress. HENT:     Head: Normocephalic and atraumatic.  Eyes:     General: No scleral icterus. Cardiovascular:     Rate and Rhythm: Normal rate and regular rhythm.     Heart sounds: Normal heart sounds.  Pulmonary:     Effort: Pulmonary effort is normal. No respiratory distress.     Breath sounds: No wheezing.  Abdominal:      General: Bowel sounds are normal. There is no distension.     Palpations: Abdomen is soft.  Musculoskeletal:        General: No deformity. Normal range of motion.     Cervical back: Normal range of motion and neck supple.  Lymphadenopathy:     Cervical: No cervical adenopathy.  Skin:    General: Skin is warm and dry.     Findings: No rash.  Neurological:     Mental Status: He is alert and oriented to person, place, and time. Mental status is at baseline.     Cranial Nerves: No cranial nerve deficit.  Psychiatric:        Mood and Affect: Mood normal.     LABORATORY DATA:  I have reviewed the data as listed    Latest Ref Rng & Units 03/25/2023   11:37 AM 01/19/2023    9:29 AM 07/09/2022    1:53 PM  CBC  WBC 4.0 - 10.5 K/uL 10.5  10.1  8.4   Hemoglobin 13.0 - 17.0 g/dL 16.1  09.6  04.5   Hematocrit 39.0 - 52.0 % 46.7  57.0  49.8   Platelets 150 - 400 K/uL 158  243  212       Latest Ref Rng & Units 01/19/2023    9:29 AM 07/09/2022    1:53 PM 07/10/2021    8:21 AM  CMP  Glucose 65 - 99 mg/dL 409  811  914   BUN 7 - 25 mg/dL 5  7  7    Creatinine 0.70 - 1.28 mg/dL 7.82  9.56  2.13   Sodium 135 - 146 mmol/L 136  136  137   Potassium 3.5 - 5.3 mmol/L 3.4  3.6  4.0   Chloride 98 - 110 mmol/L 95  100  100   CO2 20 - 32 mmol/L 30  24  26    Calcium 8.6 - 10.3 mg/dL 8.8  9.0  9.4   Total Protein 6.1 - 8.1 g/dL 6.9  7.1  7.5   Total Bilirubin 0.2 - 1.2 mg/dL 1.1  0.9  1.2   AST 10 - 35 U/L 45  37  49   ALT 9 - 46 U/L 23  20  31        RADIOGRAPHIC STUDIES: I have personally reviewed the radiological images as listed and agreed with the findings in the report. No results found.

## 2023-03-28 ENCOUNTER — Telehealth: Payer: Self-pay

## 2023-03-28 NOTE — Telephone Encounter (Signed)
Called and spoke to pt. Infomed him of MD recommendation and follow up plan.   Please schedule and inform pt of appt details:   lab/MD/ +/- phleb in 3 months (cbc, B12, folate)

## 2023-03-28 NOTE — Telephone Encounter (Signed)
-----   Message from Rickard Patience sent at 03/25/2023  8:35 PM EDT ----- Please let patient know that his red blood cell has improved, currently no need to remove blood.  His folate level is low, recommend him to take folic acid. I have sent Rx to his mail order pharmacy.  Follow up 3 months lab MD cbc b12, folate +/- phlebotomy labs are ordered.

## 2023-05-02 ENCOUNTER — Institutional Professional Consult (permissible substitution): Payer: 59 | Admitting: Internal Medicine

## 2023-06-06 ENCOUNTER — Other Ambulatory Visit: Payer: Self-pay | Admitting: Family Medicine

## 2023-06-06 DIAGNOSIS — Z760 Encounter for issue of repeat prescription: Secondary | ICD-10-CM

## 2023-06-27 ENCOUNTER — Telehealth (INDEPENDENT_AMBULATORY_CARE_PROVIDER_SITE_OTHER): Payer: Self-pay | Admitting: Physician Assistant

## 2023-06-27 ENCOUNTER — Ambulatory Visit: Payer: Self-pay

## 2023-06-27 DIAGNOSIS — Z91199 Patient's noncompliance with other medical treatment and regimen due to unspecified reason: Secondary | ICD-10-CM

## 2023-06-27 NOTE — Telephone Encounter (Signed)
 Summary: Diarrhea Advice   Pt is calling to report diarrhea for one week with cough & congestion. There are appts this afternoon with Erin Mecum.     Chief Complaint: Cough, congestion, diarrhea.Fever on and off. Symptoms: Above Frequency: 1 week ago Pertinent Negatives: Patient denies SOB Disposition: [] ED /[] Urgent Care (no appt availability in office) / [x] Appointment(In office/virtual)/ []  [facility]/ [] Home Care/ [] Refused Recommended Disposition /[] [place] [facility]/ []  Follow-up with PCP Additional Notes: Agrees with appointment.  Reason for Disposition  [1] Continuous (nonstop) coughing interferes with work or school AND [2] no improvement using cough treatment per Care Advice  Answer Assessment - Initial Assessment Questions 1. ONSET: When did the cough begin?      1 week ago 2. SEVERITY: How bad is the cough today?      Severe 3. SPUTUM: Describe the color of your sputum (none, dry cough; clear, white, yellow, green)     Dark 4. HEMOPTYSIS: Are you coughing up any blood? If so ask: How much? (flecks, streaks, tablespoons, etc.)     Dark 5. DIFFICULTY BREATHING: Are you having difficulty breathing? If Yes, ask: How bad is it? (e.g., mild, moderate, severe)    - MILD: No SOB at rest, mild SOB with walking, speaks normally in sentences, can lie down, no retractions, pulse < 100.    - MODERATE: SOB at rest, SOB with minimal exertion and prefers to sit, cannot lie down flat, speaks in phrases, mild retractions, audible wheezing, pulse 100-120.    - SEVERE: Very SOB at rest, speaks in single words, struggling to breathe, sitting hunched forward, retractions, pulse > 120      No 6. FEVER: Do you have a fever? If Yes, ask: What is your temperature, how was it measured, and when did it start?     Last week 7. CARDIAC HISTORY: Do you have any history of heart disease? (e.g., heart attack, congestive heart failure)      No 8. LUNG HISTORY: Do you  have any history of lung disease?  (e.g., pulmonary embolus, asthma, emphysema)     No 9. PE RISK FACTORS: Do you have a history of blood clots? (or: recent major surgery, recent prolonged travel, bedridden)     No 10. OTHER SYMPTOMS: Do you have any other symptoms? (e.g., runny nose, wheezing, chest pain)       Diarrhea, sinus 11. PREGNANCY: Is there any chance you are pregnant? When was your last menstrual period?       N/a 12. TRAVEL: Have you traveled out of the country in the last month? (e.g., travel history, exposures)       No  Protocols used: Cough - Acute Productive-A-AH

## 2023-06-28 ENCOUNTER — Inpatient Hospital Stay: Payer: Medicare Other

## 2023-06-28 ENCOUNTER — Inpatient Hospital Stay: Payer: Medicare Other | Admitting: Oncology

## 2023-06-28 NOTE — Progress Notes (Signed)
 Patient did not present for apt

## 2023-06-30 ENCOUNTER — Ambulatory Visit (INDEPENDENT_AMBULATORY_CARE_PROVIDER_SITE_OTHER): Payer: Commercial Managed Care - HMO | Admitting: Physician Assistant

## 2023-06-30 VITALS — BP 122/70 | HR 86 | Resp 16 | Ht 72.0 in | Wt 212.0 lb

## 2023-06-30 DIAGNOSIS — J069 Acute upper respiratory infection, unspecified: Secondary | ICD-10-CM | POA: Diagnosis not present

## 2023-06-30 MED ORDER — AMOXICILLIN-POT CLAVULANATE 875-125 MG PO TABS
1.0000 | ORAL_TABLET | Freq: Two times a day (BID) | ORAL | 0 refills | Status: DC
Start: 2023-06-30 — End: 2023-07-21

## 2023-06-30 NOTE — Progress Notes (Signed)
 Acute Office Visit   Patient: James Medina   DOB: May 03, 1946   78 y.o. Male  MRN: [id number] Visit Date: 06/30/2023  Today's healthcare provider: Rocky BRAVO Bernardino Dowell, PA-C  Introduced myself to the patient as a PA-C and provided education on APPs in clinical practice.    Chief Complaint  Patient presents with   Cough    Productive, x2 weeks   Sore Throat   Diarrhea    X2 weeks   Subjective    HPI HPI     Cough    Additional comments: Productive, x2 weeks        Diarrhea    Additional comments: X2 weeks      Last edited by Dann Kirsch, CMA on 06/30/2023  8:58 AM.       Patient reports ongoing coughing, sore throat and diarrhea for the past 2 weeks   Interventions: OTC cough/cold medication and lozenges   Recent sick contacts: Unsure, grandchildren did not appear sick when he was there  Recent Travel: went to [place] 2 weeks ago- thinks his grandchildren may have given him something  COVID testing at home: he has not tested   Result: NA    Medications: Outpatient Medications Prior to Visit  Medication Sig   atorvastatin  (LIPITOR) 10 MG tablet TAKE 1 TABLET BY MOUTH EVERY OTHER DAY IN THE EVENING   benazepril  (LOTENSIN ) 40 MG tablet Take 1 tablet (40 mg total) by mouth daily.   colchicine  0.6 MG tablet Take two pills at the onset of a gout attack; take one more pill one hour later   folic acid  (FOLVITE ) 1 MG tablet Take 1 tablet (1 mg total) by mouth daily.   sildenafil  (VIAGRA ) 25 MG tablet Take 1-2 tablets by mouth daily as needed for erectile dysfunction.   No facility-administered medications prior to visit.    Review of Systems  Constitutional:  Positive for chills, fatigue and fever (subjective).  HENT:  Positive for congestion, ear pain, sore throat and voice change. Negative for postnasal drip, sinus pressure and sinus pain.   Respiratory:  Positive for cough (intermittently productive). Negative for shortness of breath and wheezing.    Gastrointestinal:  Positive for diarrhea. Negative for nausea and vomiting.  Musculoskeletal:  Negative for myalgias.  Skin:  Negative for rash.  Neurological:  Negative for dizziness, light-headedness and headaches.        Objective    BP 122/70   Pulse 86   Resp 16   Ht 6' (1.829 m)   Wt 212 lb (96.2 kg)   SpO2 95%   BMI 28.75 kg/m     Physical Exam Vitals reviewed.  Constitutional:      General: He is awake.     Appearance: He is well-developed and well-groomed. He is ill-appearing.  HENT:     Head: Normocephalic and atraumatic.     Right Ear: Tympanic membrane and ear canal normal.     Left Ear: Tympanic membrane and ear canal normal.     Mouth/Throat:     Mouth: Mucous membranes are moist. No oral lesions.     Pharynx: Uvula midline. Posterior oropharyngeal erythema present. No pharyngeal swelling, oropharyngeal exudate or uvula swelling.     Tonsils: No tonsillar exudate or tonsillar abscesses.  Eyes:     General: Lids are normal. Allergic shiner present.  Cardiovascular:     Rate and Rhythm: Normal rate and regular rhythm.     Heart sounds:  Normal heart sounds.  Pulmonary:     Effort: Pulmonary effort is normal.     Breath sounds: Normal breath sounds. No decreased air movement. No decreased breath sounds, wheezing, rhonchi or rales.  Musculoskeletal:     Cervical back: Normal range of motion and neck supple.  Lymphadenopathy:     Head:     Right side of head: Submandibular adenopathy present. No submental or preauricular adenopathy.     Left side of head: Submandibular adenopathy present. No submental or preauricular adenopathy.     Cervical: Cervical adenopathy present.     Right cervical: Superficial cervical adenopathy present.     Left cervical: Superficial cervical adenopathy present.     Upper Body:     Right upper body: No supraclavicular adenopathy.     Left upper body: No supraclavicular adenopathy.  Neurological:     General: No focal deficit  present.     Mental Status: He is alert and oriented to person, place, and time.  Psychiatric:        Mood and Affect: Mood normal.        Behavior: Behavior normal. Behavior is cooperative.        Thought Content: Thought content normal.       No results found for any visits on 06/30/23.  Assessment & Plan      No follow-ups on file.     Problem List Items Addressed This Visit   None Visit Diagnoses       Upper respiratory tract infection, unspecified type    -  Primary   Relevant Medications   amoxicillin -clavulanate (AUGMENTIN ) 875-125 MG tablet      Acute, new concern Patient reports ongoing URI- symptoms comprised of chills, subjective fever,congestion, sore throat, cough that are not resolving with home measures Suspect bacterial URI/ sinusitis at this time given HPI and physical exam Will send in script for Augmentin  PO BID x 10 days to assist with resolution.  Reviewed using OTC medications for symptomatic management Reviewed ED and return precautions Follow up as needed for persistent or progressing symptoms     No follow-ups on file.   I, Eliga Arvie E Chantry Headen, PA-C, have reviewed all documentation for this visit. The documentation on 07/01/23 for the exam, diagnosis, procedures, and orders are all accurate and complete.   Rocky Mt, MHS, PA-C [facility]

## 2023-06-30 NOTE — Patient Instructions (Signed)
 Based on your symptoms and duration of illness, I believe you may have a bacterial sinus infection  These typically resolve with antibiotic therapy along with at-home comfort measures  Today I have sent in a prescription for Augmentin  875-125 mg to be taken by mouth twice per day for 10 days FINISH THE ENTIRE COURSE unless you develop an allergic reaction or are instructed to discontinue.  It can take a few days for the antibiotic to kick in so I recommend symptomatic relief with over the counter medication such as the following: Dayquil/ Nyquil Theraflu Alkaseltzer   If you have high blood pressure you can take regular Mucinex and regular Robitussin instead of the combo medications   These medications typically have Tylenol  in them already so you can take Ibuprofen as needed for further pain/ discomfort and fever management/ do not need to supplement with more outside of those medications  I also recommend adding an antihistamine to your daily regimen This includes medications like Claritin, Allegra, Zyrtec- the generics of these work very well and are usually less expensive I recommend using Flonase nasal spray - 2 puffs twice per day to help with your nasal congestion The antihistamines and Flonase can take a few weeks to provide significant relief from allergy symptoms but should start to provide some benefit soon.  Stay well hydrated with at least 75 oz of water per day to help with recovery  If you notice any of the following please let us  know: increased fever not responding to Tylenol  or Ibuprofen, swelling around your nose or eyes, difficulty seeing,

## 2023-07-12 ENCOUNTER — Other Ambulatory Visit: Payer: Self-pay | Admitting: Family Medicine

## 2023-07-12 DIAGNOSIS — Z760 Encounter for issue of repeat prescription: Secondary | ICD-10-CM

## 2023-07-14 ENCOUNTER — Other Ambulatory Visit: Payer: Self-pay | Admitting: Family Medicine

## 2023-07-14 DIAGNOSIS — Z760 Encounter for issue of repeat prescription: Secondary | ICD-10-CM

## 2023-07-14 NOTE — Telephone Encounter (Signed)
Requested medication (s) are due for refill today: yes  Requested medication (s) are on the active medication list: yes  Last refill:  06/06/23  Future visit scheduled: yes  Notes to clinic:  Unable to refill per protocol, courtesy refill already given, routing for provider approval.      Requested Prescriptions  Pending Prescriptions Disp Refills   atorvastatin (LIPITOR) 10 MG tablet 15 tablet 0     Cardiovascular:  Antilipid - Statins Failed - 07/14/2023  1:31 PM      Failed - Lipid Panel in normal range within the last 12 months    Cholesterol, Total  Date Value Ref Range Status  09/05/2015 187 100 - 199 mg/dL Final   Cholesterol  Date Value Ref Range Status  07/09/2022 150 <200 mg/dL Final   LDL Cholesterol (Calc)  Date Value Ref Range Status  07/09/2022 69 mg/dL (calc) Final    Comment:    Reference range: <100 . Desirable range <100 mg/dL for primary prevention;   <70 mg/dL for patients with CHD or diabetic patients  with > or = 2 CHD risk factors. Marland Kitchen LDL-C is now calculated using the Martin-Hopkins  calculation, which is a validated novel method providing  better accuracy than the Friedewald equation in the  estimation of LDL-C.  Horald Pollen et al. Lenox Ahr. 1062;694(85): 2061-2068  (http://education.[employer].com/faq/FAQ164)    HDL  Date Value Ref Range Status  07/09/2022 65 > OR = 40 mg/dL Final  46/27/0350 88 >09 mg/dL Final   Triglycerides  Date Value Ref Range Status  07/09/2022 76 <150 mg/dL Final         Passed - Patient is not pregnant      Passed - Valid encounter within last 12 months    Recent Outpatient Visits           2 weeks ago Upper respiratory tract infection, unspecified type   [facility] Mecum, Oswaldo Conroy, PA-C   2 weeks ago No-show for appointment   [facility] Mecum, Oswaldo Conroy, PA-C   5 months ago Essential hypertension   [facility] Al[facility] Cory, MD   1 year ago Essential hypertension   [facility] Margarita Mail, DO   1 year ago Essential hypertension   [facility] Danelle Berry, PA-C       Future Appointments             In 1 week Danelle Berry, PA-C [facility]

## 2023-07-14 NOTE — Telephone Encounter (Signed)
Medication Refill -  Most Recent Primary Care Visit:  Provider: Providence Crosby  Department: [facility]  Visit Type: OFFICE VISIT  Date: 06/30/2023  Medication: atorvastatin (LIPITOR) 10 MG tablet  Has the patient contacted their pharmacy? No (Agent: If no, request that the patient contact the pharmacy for the refill. If patient does not wish to contact the pharmacy document the reason why and proceed with request.) (Agent: If yes, when and what did the pharmacy advise?)  Is this the correct pharmacy for this prescription? Yes If no, delete pharmacy and type the correct one.  This is the patient's preferred pharmacy:  [facility] - [place], [place] - [address] [place] [address] Phone: [phone] Fax: [phone]   Has the prescription been filled recently? Yes  Is the patient out of the medication? Yes  Has the patient been seen for an appointment in the last year OR does the patient have an upcoming appointment? Yes Next OV 07/21/23  Can we respond through MyChart? Yes  Agent: Please be advised that Rx refills may take up to 3 business days. We ask that you follow-up with your pharmacy.

## 2023-07-15 MED ORDER — ATORVASTATIN CALCIUM 10 MG PO TABS
ORAL_TABLET | ORAL | 0 refills | Status: DC
Start: 2023-07-15 — End: 2023-07-20

## 2023-07-21 ENCOUNTER — Ambulatory Visit (INDEPENDENT_AMBULATORY_CARE_PROVIDER_SITE_OTHER): Payer: Commercial Managed Care - HMO | Admitting: Family Medicine

## 2023-07-21 ENCOUNTER — Encounter: Payer: Self-pay | Admitting: Family Medicine

## 2023-07-21 VITALS — BP 132/70 | HR 86 | Resp 16 | Ht 72.0 in | Wt 215.0 lb

## 2023-07-21 DIAGNOSIS — I10 Essential (primary) hypertension: Secondary | ICD-10-CM | POA: Diagnosis not present

## 2023-07-21 DIAGNOSIS — E782 Mixed hyperlipidemia: Secondary | ICD-10-CM

## 2023-07-21 DIAGNOSIS — F1020 Alcohol dependence, uncomplicated: Secondary | ICD-10-CM

## 2023-07-21 DIAGNOSIS — D582 Other hemoglobinopathies: Secondary | ICD-10-CM | POA: Diagnosis not present

## 2023-07-21 DIAGNOSIS — E519 Thiamine deficiency, unspecified: Secondary | ICD-10-CM

## 2023-07-21 DIAGNOSIS — D7589 Other specified diseases of blood and blood-forming organs: Secondary | ICD-10-CM

## 2023-07-21 DIAGNOSIS — E538 Deficiency of other specified B group vitamins: Secondary | ICD-10-CM

## 2023-07-21 DIAGNOSIS — R7303 Prediabetes: Secondary | ICD-10-CM

## 2023-07-21 MED ORDER — ATORVASTATIN CALCIUM 10 MG PO TABS
ORAL_TABLET | ORAL | 3 refills | Status: DC
Start: 2023-07-21 — End: 2024-01-30

## 2023-07-21 MED ORDER — BENAZEPRIL HCL 40 MG PO TABS
40.0000 mg | ORAL_TABLET | Freq: Every day | ORAL | 1 refills | Status: DC
Start: 2023-07-21 — End: 2024-01-30

## 2023-07-21 NOTE — Assessment & Plan Note (Signed)
Still drinking several ounces/shots of vodka daily Flowsheet Row Office Visit from 07/21/2023 in [facility]  AUDIT-C Score 5     Educated pt on moderate ETOH use and diseases/cancers etc that he is at risk for with current ETOH use

## 2023-07-21 NOTE — Patient Instructions (Signed)
Recommend taking folic acid and thiamine and reducing how much liquor you drink to one drink 4 days a week or less to reduce your deficiencies and several other health risks (cancers, heart failure, liver disease/liver failure).

## 2023-07-21 NOTE — Assessment & Plan Note (Signed)
Pt managed on benazepril 40 mg with BP near goal today BP Readings from Last 3 Encounters:  07/21/23 132/70  06/30/23 122/70  03/25/23 94/61

## 2023-07-21 NOTE — Progress Notes (Signed)
Name: James Medina   MRN: [id number]    DOB: 1945-09-23   Date:07/21/2023       Progress Note  Chief Complaint  Patient presents with   Medical Management of Chronic Issues    6 months   Hypertension     Subjective:   James Medina is a 78 y.o. male, presents to clinic for routine f/up on chronic conditions  Hyperlipidemia: Currently treated with atorvastatin 3 d a week, cannot tolerate higher doses, pt reports good med compliance Last Lipids: Lab Results  Component Value Date   CHOL 150 07/09/2022   HDL 65 07/09/2022   LDLCALC 69 07/09/2022   TRIG 76 07/09/2022   CHOLHDL 2.3 07/09/2022   - Denies: Chest pain, shortness of breath, myalgias, claudication  Hypertension:  Currently managed on benazepril   Pt reports good med compliance and denies any SE.   Blood pressure today is near well controlled. BP Readings from Last 3 Encounters:  07/21/23 132/70  06/30/23 122/70  03/25/23 94/61   Pt denies CP, SOB, exertional sx, LE edema, palpitation, Ha's, visual disturbances, lightheadedness, hypotension, syncope.   Gout - colchicine one hand, no recent flares  Lab Results  Component Value Date   LABURIC 6.7 01/19/2023   Flowsheet Row Clinical Support from 11/25/2022 in [facility]  AUDIT-C Score 0      Macrocytosis/elevated Hgb - lost to f/up with Hematology    Current Outpatient Medications:    atorvastatin (LIPITOR) 10 MG tablet, TAKE 1 TABLET BY MOUTH EVERY OTHER DAY IN THE EVENING, Disp: 15 tablet, Rfl: 0   benazepril (LOTENSIN) 40 MG tablet, Take 1 tablet (40 mg total) by mouth daily., Disp: 90 tablet, Rfl: 1   colchicine 0.6 MG tablet, Take two pills at the onset of a gout attack; take one more pill one hour later, Disp: 3 tablet, Rfl: 5   folic acid (FOLVITE) 1 MG tablet, Take 1 tablet (1 mg total) by mouth daily., Disp: 90 tablet, Rfl: 0   sildenafil (VIAGRA) 25 MG tablet, Take 1-2 tablets by mouth daily as needed for  erectile dysfunction., Disp: 10 tablet, Rfl: 2  Patient Active Problem List   Diagnosis Date Noted   Secondary erythrocytosis 03/25/2023   History of colonic polyps 08/30/2022   Rectal polyp 08/30/2022   Polyp of descending colon 08/30/2022   History of gout 12/31/2021   Elevated LFTs 10/24/2019   Prediabetes 08/03/2019   Obstructive sleep apnea 07/14/2018   Pulmonary nodule 07/14/2018   Osteoarthritis of knee 04/04/2018   Thoracic aortic aneurysm without rupture (HCC) 01/25/2018   Alcohol dependence, daily use (HCC) 10/18/2017   Osteoarthritis of both hands 10/18/2017   Gout 03/25/2017   Elevated hemoglobin (HCC) 07/13/2016   Umbilical hernia without obstruction or gangrene 04/26/2016   Impaired fasting glucose 09/16/2015   Macrocytosis without anemia 09/16/2015   Hematospermia 09/05/2015   Abnormal CBC 06/08/2015   Mixed hyperlipidemia    Essential hypertension    ED (erectile dysfunction)    Impotence     Past Surgical History:  Procedure Laterality Date   APPENDECTOMY     broken ankle     COLONOSCOPY WITH PROPOFOL N/A 08/27/2020   Procedure: COLONOSCOPY WITH PROPOFOL;  Surgeon: Pasty Spillers, MD;  Location: [facility] ENDOSCOPY;  Service: Endoscopy;  Laterality: N/A;   COLONOSCOPY WITH PROPOFOL N/A 08/30/2022   Procedure: COLONOSCOPY WITH PROPOFOL;  Surgeon: Toney Reil, MD;  Location: [facility] ENDOSCOPY;  Service: Gastroenterology;  Laterality: N/A;  FRACTURE SURGERY      Family History  Problem Relation Age of Onset   Cancer Mother        leukemia   Leukemia Mother    Heart disease Father    Heart disease Sister        pacemaker   Diabetes Brother    Stroke Brother    Diabetes Brother    Learning disabilities Maternal Uncle    Cancer Maternal Grandmother    Breast cancer Maternal Grandmother    Emphysema Maternal Grandfather    COPD Neg Hx    Hypertension Neg Hx     Social History   Tobacco Use   Smoking status: Former    Current packs/day:  0.00    Average packs/day: 1 pack/day for 15.0 years (15.0 ttl pk-yrs)    Types: Cigars, Cigarettes    Start date: 04/06/1997    Quit date: 04/06/2012    Years since quitting: 11.2   Smokeless tobacco: Current    Types: Chew   Tobacco comments:    One can a day   Vaping Use   Vaping status: Never Used  Substance Use Topics   Alcohol use: Not Currently    Comment: 2-3 drinks a night (vodka and sprite)   Drug use: Not Currently     No Known Allergies  Health Maintenance  Topic Date Due   COVID-19 Vaccine (4 - 2024-25 season) 08/05/2023 (Originally 02/20/2023)   INFLUENZA VACCINE  09/19/2023 (Originally 01/20/2023)   Medicare Annual Wellness (AWV)  11/25/2023   Colonoscopy  08/29/2024   DTaP/Tdap/Td (3 - Td or Tdap) 10/18/2027   Pneumonia Vaccine 53+ Years old  Completed   Hepatitis C Screening  Completed   Zoster Vaccines- Shingrix  Completed   HPV VACCINES  Aged Out    Chart Review Today: I personally reviewed active problem list, medication list, allergies, family history, social history, health maintenance, notes from last encounter, lab results, imaging with the patient/caregiver today.   Review of Systems  Constitutional: Negative.   HENT: Negative.    Eyes: Negative.   Respiratory: Negative.    Cardiovascular: Negative.   Gastrointestinal: Negative.   Endocrine: Negative.   Genitourinary: Negative.   Musculoskeletal: Negative.   Skin: Negative.   Allergic/Immunologic: Negative.   Neurological: Negative.   Hematological: Negative.   Psychiatric/Behavioral: Negative.    All other systems reviewed and are negative.    Objective:   Vitals:   07/21/23 0856  BP: 132/70  Pulse: 86  Resp: 16  SpO2: 98%  Weight: 215 lb (97.5 kg)  Height: 6' (1.829 m)    Body mass index is 29.16 kg/m.  Physical Exam Vitals and nursing note reviewed.  Constitutional:      General: He is not in acute distress.    Appearance: Normal appearance. He is well-developed. He is  not ill-appearing, toxic-appearing or diaphoretic.  HENT:     Head: Normocephalic and atraumatic.     Nose: Nose normal.  Eyes:     General:        Right eye: No discharge.        Left eye: No discharge.     Conjunctiva/sclera: Conjunctivae normal.  Neck:     Trachea: No tracheal deviation.  Cardiovascular:     Rate and Rhythm: Normal rate and regular rhythm.     Pulses: Normal pulses.     Heart sounds: Normal heart sounds.  Pulmonary:     Effort: Pulmonary effort is normal. No respiratory distress.  Breath sounds: Normal breath sounds. No stridor. No wheezing, rhonchi or rales.  Skin:    General: Skin is warm and dry.     Findings: No rash.  Neurological:     Mental Status: He is alert.     Motor: No abnormal muscle tone.     Coordination: Coordination normal.  Psychiatric:        Mood and Affect: Mood normal.        Behavior: Behavior normal.         Assessment & Plan:   Essential hypertension Assessment & Plan: Pt managed on benazepril 40 mg with BP near goal today BP Readings from Last 3 Encounters:  07/21/23 132/70  06/30/23 122/70  03/25/23 94/61    Orders: -     Atorvastatin Calcium; TAKE 1 TABLET BY MOUTH EVERY OTHER DAY IN THE EVENING  Dispense: 45 tablet; Refill: 3 -     Benazepril HCl; Take 1 tablet (40 mg total) by mouth daily.  Dispense: 90 tablet; Refill: 1 -     Lipid panel -     COMPLETE METABOLIC PANEL WITH GFR  Elevated hemoglobin (HCC) Assessment & Plan: See above/macrocytosis   Alcohol dependence, daily use (HCC) Assessment & Plan: Still drinking several ounces/shots of vodka daily Flowsheet Row Office Visit from [facility]/21/20[facility] in [facility]  AUDIT-C Score 5     Educated pt on moderate ETOH use and diseases/cancers etc that he is at risk for with current ETOH use   Orders: -     CBC with Differential/Platelet -     COMPLETE METABOLIC PANEL WITH GFR  Macrocytosis without anemia Assessment & Plan: He  missed his f/up with Dr. Cathie Hoops, has not been on supplements or decreased ETOH intake I will order the repeat labs Reviewed supplements/deficiencies, possible sx and diseases from ETOH and deficiencies  He was encouraged to get his f/up appt rescheduled  Orders: -     CBC with Differential/Platelet -     B12 and Folate Panel -     Vitamin B1  Mixed hyperlipidemia Assessment & Plan: Pt only able to tolerate lower dose statin 3 d a week and has myalgias and SE when he have tried to increase in the past Lab Results  Component Value Date   CHOL 150 07/09/2022   HDL 65 07/09/2022   LDLCALC 69 07/09/2022   TRIG 76 07/09/2022   CHOLHDL 2.3 07/09/2022  Lipids due   Orders: -     Atorvastatin Calcium; TAKE 1 TABLET BY MOUTH EVERY OTHER DAY IN THE EVENING  Dispense: 45 tablet; Refill: 3 -     Lipid panel -     COMPLETE METABOLIC PANEL WITH GFR       Return in about 6 months (around 01/18/2024) for HTN.   Danelle Berry, PA-C 07/21/23 9:20 AM

## 2023-07-21 NOTE — Assessment & Plan Note (Signed)
Pt only able to tolerate lower dose statin 3 d a week and has myalgias and SE when he have tried to increase in the past Lab Results  Component Value Date   CHOL 150 07/09/2022   HDL 65 07/09/2022   LDLCALC 69 07/09/2022   TRIG 76 07/09/2022   CHOLHDL 2.3 07/09/2022  Lipids due

## 2023-07-21 NOTE — Assessment & Plan Note (Addendum)
See above/macrocytosis

## 2023-07-21 NOTE — Assessment & Plan Note (Signed)
He missed his f/up with Dr. Cathie Hoops, has not been on supplements or decreased ETOH intake I will order the repeat labs Reviewed supplements/deficiencies, possible sx and diseases from ETOH and deficiencies  He was encouraged to get his f/up appt rescheduled

## 2023-07-22 DIAGNOSIS — E538 Deficiency of other specified B group vitamins: Secondary | ICD-10-CM | POA: Insufficient documentation

## 2023-07-22 DIAGNOSIS — E519 Thiamine deficiency, unspecified: Secondary | ICD-10-CM | POA: Insufficient documentation

## 2023-07-22 MED ORDER — THIAMINE HCL 100 MG PO TABS: 100.0000 mg | ORAL_TABLET | Freq: Every day | ORAL | 0 refills | Status: AC

## 2023-07-22 MED ORDER — FOLIC ACID 1 MG PO TABS: 1.0000 mg | ORAL_TABLET | Freq: Every day | ORAL | 0 refills | Status: DC

## 2023-07-22 NOTE — Progress Notes (Signed)
His blood counts and cell indices are worse than his last labs - His folate was low, b12 was adequate and I'm waiting for the B1/thiamine which is likely to be low as well.  I'm sending into his pharmacy those two supplements, which will have a better chance of getting absorbed if he cuts back on how much liquor he drinks. He may want to work on this and take the supplements and then f/up with hematology in 3 months or else I expect no different plan or labs.  For his high RBC/hemoglobin -  Is he using/tolerating CPAP?  There are notes in chart that says he cannot tolerate it Untreated OSA can make blood counts high and if he wants to connect again with sleep medicine we should refer him to work on that.    Cholesterol levels were good - no change to meds or dosing He had some elevated liver enzymes - which may also be from alcohol.   Glucose was high, I will add on the A1C to recheck.   (I ordered A1C and please see if leslie/quest can add it on)  Recommend 3 month f/up with hem/onc Dr. Cathie Hoops and f/up here again in 6 months, thanks

## 2023-07-22 NOTE — Addendum Note (Signed)
Addended by: Danelle Berry on: 07/22/2023 10:49 AM   Modules accepted: Orders

## 2023-07-27 LAB — CBC WITH DIFFERENTIAL/PLATELET
Absolute Lymphocytes: 2138 {cells}/uL (ref 850–3900)
Absolute Monocytes: 437 {cells}/uL (ref 200–950)
Basophils Absolute: 49 {cells}/uL (ref 0–200)
Basophils Relative: 0.6 %
Eosinophils Absolute: 227 {cells}/uL (ref 15–500)
Eosinophils Relative: 2.8 %
HCT: 50.4 % — ABNORMAL HIGH (ref 38.5–50.0)
Hemoglobin: 18.2 g/dL — ABNORMAL HIGH (ref 13.2–17.1)
MCH: 41.2 pg — ABNORMAL HIGH (ref 27.0–33.0)
MCHC: 36.1 g/dL — ABNORMAL HIGH (ref 32.0–36.0)
MCV: 114 fL — ABNORMAL HIGH (ref 80.0–100.0)
MPV: 9.5 fL (ref 7.5–12.5)
Monocytes Relative: 5.4 %
Neutro Abs: 5249 {cells}/uL (ref 1500–7800)
Neutrophils Relative %: 64.8 %
Platelets: 191 10*3/uL (ref 140–400)
RBC: 4.42 10*6/uL (ref 4.20–5.80)
RDW: 13.8 % (ref 11.0–15.0)
Total Lymphocyte: 26.4 %
WBC: 8.1 10*3/uL (ref 3.8–10.8)

## 2023-07-27 LAB — COMPLETE METABOLIC PANEL WITH GFR
AG Ratio: 1.2 (calc) (ref 1.0–2.5)
ALT: 27 U/L (ref 9–46)
AST: 52 U/L — ABNORMAL HIGH (ref 10–35)
Albumin: 3.6 g/dL (ref 3.6–5.1)
Alkaline phosphatase (APISO): 148 U/L — ABNORMAL HIGH (ref 35–144)
BUN: 9 mg/dL (ref 7–25)
CO2: 29 mmol/L (ref 20–32)
Calcium: 8.4 mg/dL — ABNORMAL LOW (ref 8.6–10.3)
Chloride: 98 mmol/L (ref 98–110)
Creat: 0.72 mg/dL (ref 0.70–1.28)
Globulin: 3 g/dL (ref 1.9–3.7)
Glucose, Bld: 134 mg/dL — ABNORMAL HIGH (ref 65–99)
Potassium: 3.5 mmol/L (ref 3.5–5.3)
Sodium: 139 mmol/L (ref 135–146)
Total Bilirubin: 1.4 mg/dL — ABNORMAL HIGH (ref 0.2–1.2)
Total Protein: 6.6 g/dL (ref 6.1–8.1)
eGFR: 95 mL/min/{1.73_m2} (ref >=60)

## 2023-07-27 LAB — TEST AUTHORIZATION

## 2023-07-27 LAB — LIPID PANEL
Cholesterol: 136 mg/dL (ref <200)
HDL: 42 mg/dL (ref >=40)
LDL Cholesterol (Calc): 77 mg/dL
Non-HDL Cholesterol (Calc): 94 mg/dL (ref <130)
Total CHOL/HDL Ratio: 3.2 (calc) (ref <5.0)
Triglycerides: 86 mg/dL (ref <150)

## 2023-07-27 LAB — HEMOGLOBIN A1C
Hgb A1c MFr Bld: 6.5 %{Hb} — ABNORMAL HIGH (ref <5.7)
Mean Plasma Glucose: 140 mg/dL
eAG (mmol/L): 7.7 mmol/L

## 2023-07-27 LAB — B12 AND FOLATE PANEL
Folate: 2.6 ng/mL — ABNORMAL LOW
Vitamin B-12: 548 pg/mL (ref 200–1100)

## 2023-07-27 LAB — VITAMIN B1: Vitamin B1 (Thiamine): <6 nmol/L — ABNORMAL LOW (ref 8–30)

## 2023-08-22 ENCOUNTER — Encounter: Payer: Self-pay | Admitting: Family Medicine

## 2023-08-22 ENCOUNTER — Ambulatory Visit (INDEPENDENT_AMBULATORY_CARE_PROVIDER_SITE_OTHER): Admitting: Family Medicine

## 2023-08-22 VITALS — BP 126/74 | HR 78 | Resp 16 | Ht 72.0 in | Wt 218.0 lb

## 2023-08-22 DIAGNOSIS — M25551 Pain in right hip: Secondary | ICD-10-CM

## 2023-08-22 DIAGNOSIS — M170 Bilateral primary osteoarthritis of knee: Secondary | ICD-10-CM | POA: Diagnosis not present

## 2023-08-22 DIAGNOSIS — M79604 Pain in right leg: Secondary | ICD-10-CM | POA: Diagnosis not present

## 2023-08-22 DIAGNOSIS — I712 Thoracic aortic aneurysm, without rupture, unspecified: Secondary | ICD-10-CM | POA: Diagnosis not present

## 2023-08-22 MED ORDER — PREGABALIN 25 MG PO CAPS: 25.0000 mg | ORAL_CAPSULE | Freq: Two times a day (BID) | ORAL | 0 refills | Status: DC | PRN

## 2023-08-22 MED ORDER — MELOXICAM 15 MG PO TABS: 15.0000 mg | ORAL_TABLET | Freq: Every day | ORAL | 1 refills | Status: DC

## 2023-08-22 NOTE — Progress Notes (Unsigned)
 Patient ID: James Medina, male    DOB: 1946-06-12, 78 y.o.   MRN: [id number]  PCP: Danelle Berry, PA-C  Chief Complaint  Patient presents with   Leg Pain    B/L, can't put a lot of weight on legs.    Subjective:   James Medina is a 78 y.o. male, presents to clinic with CC of the following:  Leg Pain      Bilateral knee pain, left knee has been bothersomefor 2 decades, he reports getting shots in left knee several times  Right leg is also      Patient Active Problem List   Diagnosis Date Noted   Folate deficiency 07/22/2023   Thiamine deficiency 07/22/2023   Secondary erythrocytosis 03/25/2023   History of colonic polyps 08/30/2022   Rectal polyp 08/30/2022   Polyp of descending colon 08/30/2022   History of gout 12/31/2021   Elevated LFTs 10/24/2019   Prediabetes 08/03/2019   Obstructive sleep apnea 07/14/2018   Pulmonary nodule 07/14/2018   Osteoarthritis of knee 04/04/2018   Thoracic aortic aneurysm without rupture (HCC) 01/25/2018   Alcohol dependence, daily use (HCC) 10/18/2017   Osteoarthritis of both hands 10/18/2017   Gout 03/25/2017   Elevated hemoglobin (HCC) 07/13/2016   Umbilical hernia without obstruction or gangrene 04/26/2016   Impaired fasting glucose 09/16/2015   Macrocytosis without anemia 09/16/2015   Hematospermia 09/05/2015   Abnormal CBC 06/08/2015   Mixed hyperlipidemia    Essential hypertension    ED (erectile dysfunction)    Impotence       Current Outpatient Medications:    atorvastatin (LIPITOR) 10 MG tablet, TAKE 1 TABLET BY MOUTH EVERY OTHER DAY IN THE EVENING, Disp: 45 tablet, Rfl: 3   benazepril (LOTENSIN) 40 MG tablet, Take 1 tablet (40 mg total) by mouth daily., Disp: 90 tablet, Rfl: 1   colchicine 0.6 MG tablet, Take two pills at the onset of a gout attack; take one more pill one hour later, Disp: 3 tablet, Rfl: 5   folic acid (FOLVITE) 1 MG tablet, Take 1 tablet (1 mg total) by mouth daily., Disp: 90 tablet, Rfl:  0   sildenafil (VIAGRA) 25 MG tablet, Take 1-2 tablets by mouth daily as needed for erectile dysfunction., Disp: 10 tablet, Rfl: 2   thiamine (VITAMIN B1) 100 MG tablet, Take 1 tablet (100 mg total) by mouth daily., Disp: 90 tablet, Rfl: 0   No Known Allergies   Social History   Tobacco Use   Smoking status: Former    Current packs/day: 0.00    Average packs/day: 1 pack/day for 15.0 years (15.0 ttl pk-yrs)    Types: Cigars, Cigarettes    Start date: 04/06/1997    Quit date: 04/06/2012    Years since quitting: 11.3   Smokeless tobacco: Current    Types: Chew   Tobacco comments:    One can a day   Vaping Use   Vaping status: Never Used  Substance Use Topics   Alcohol use: Not Currently    Comment: 2-3 drinks a night (vodka and sprite)   Drug use: Not Currently      Chart Review Today: ***  Review of Systems     Objective:   Vitals:   08/22/23 1441  BP: 126/74  Pulse: 78  Resp: 16  SpO2: 99%  Weight: 218 lb (98.9 kg)  Height: 6' (1.829 m)    Body mass index is 29.57 kg/m.  Physical Exam Vitals and nursing note reviewed.  Constitutional:      Appearance: He is well-developed.  HENT:     Head: Normocephalic and atraumatic.     Nose: Nose normal.  Eyes:     General:        Right eye: No discharge.        Left eye: No discharge.     Conjunctiva/sclera: Conjunctivae normal.  Neck:     Trachea: No tracheal deviation.  Cardiovascular:     Rate and Rhythm: Normal rate and regular rhythm.  Pulmonary:     Effort: Pulmonary effort is normal. No respiratory distress.     Breath sounds: No stridor.  Musculoskeletal:     Lumbar back: Tenderness present. No bony tenderness.     Right hip: No tenderness or bony tenderness. Decreased range of motion.     Right knee: Swelling present.     Left knee: Swelling present.     Comments: Unable to do SLR pt unable to get up on exam table  Skin:    General: Skin is warm and dry.     Findings: No rash.  Neurological:      Mental Status: He is alert.     Motor: No abnormal muscle tone.     Coordination: Coordination normal.     Gait: Gait abnormal.  Psychiatric:        Behavior: Behavior normal.      Results for orders placed or performed in visit on 07/21/23  Lipid Profile   Collection Time: 07/21/23  9:41 AM  Result Value Ref Range   Cholesterol 136 <200 mg/dL   HDL 42 > OR = 40 mg/dL   Triglycerides 86 <409 mg/dL   LDL Cholesterol (Calc) 77 mg/dL (calc)   Total CHOL/HDL Ratio 3.2 <5.0 (calc)   Non-HDL Cholesterol (Calc) 94 <811 mg/dL (calc)  CBC with Differential/Platelet   Collection Time: 07/21/23  9:41 AM  Result Value Ref Range   WBC 8.1 3.8 - 10.8 Thousand/uL   RBC 4.42 4.20 - 5.80 Million/uL   Hemoglobin 18.2 (H) 13.2 - 17.1 g/dL   HCT 91.4 (H) 78.2 - 95.6 %   MCV 114.0 (H) 80.0 - 100.0 fL   MCH 41.2 (H) 27.0 - 33.0 pg   MCHC 36.1 (H) 32.0 - 36.0 g/dL   RDW 21.3 08.6 - 57.8 %   Platelets 191 140 - 400 Thousand/uL   MPV 9.5 7.5 - 12.5 fL   Neutro Abs 5,249 1,500 - 7,800 cells/uL   Absolute Lymphocytes 2,138 850 - 3,900 cells/uL   Absolute Monocytes 437 200 - 950 cells/uL   Eosinophils Absolute 227 15 - 500 cells/uL   Basophils Absolute 49 0 - 200 cells/uL   Neutrophils Relative % 64.8 %   Total Lymphocyte 26.4 %   Monocytes Relative 5.4 %   Eosinophils Relative 2.8 %   Basophils Relative 0.6 %  COMPLETE METABOLIC PANEL WITH GFR   Collection Time: 07/21/23  9:41 AM  Result Value Ref Range   Glucose, Bld 134 (H) 65 - 99 mg/dL   BUN 9 7 - 25 mg/dL   Creat 4.69 6.29 - 5.28 mg/dL   eGFR 95 > OR = 60 UX/LKG/4.01U2   BUN/Creatinine Ratio SEE NOTE: 6 - 22 (calc)   Sodium 139 135 - 146 mmol/L   Potassium 3.5 3.5 - 5.3 mmol/L   Chloride 98 98 - 110 mmol/L   CO2 29 20 - 32 mmol/L   Calcium 8.4 (L) 8.6 - 10.3 mg/dL   Total Protein 6.6  6.1 - 8.1 g/dL   Albumin 3.6 3.6 - 5.1 g/dL   Globulin 3.0 1.9 - 3.7 g/dL (calc)   AG Ratio 1.2 1.0 - 2.5 (calc)   Total Bilirubin 1.4 (H) 0.2  - 1.2 mg/dL   Alkaline phosphatase (APISO) 148 (H) 35 - 144 U/L   AST 52 (H) 10 - 35 U/L   ALT 27 9 - 46 U/L  B12 and Folate Panel   Collection Time: 07/21/23  9:41 AM  Result Value Ref Range   Vitamin B-12 548 200 - 1,100 pg/mL   Folate 2.6 (L) ng/mL  Vitamin B1   Collection Time: 07/21/23  9:41 AM  Result Value Ref Range   Vitamin B1 (Thiamine) <6 (L) 8 - 30 nmol/L  Hemoglobin A1c   Collection Time: 07/21/23  9:41 AM  Result Value Ref Range   Hgb A1c MFr Bld 6.5 (H) <5.7 % of total Hgb   Mean Plasma Glucose 140 mg/dL   eAG (mmol/L) 7.7 mmol/L  TEST AUTHORIZATION   Collection Time: 07/21/23  9:41 AM  Result Value Ref Range   TEST NAME: HEMOGLOBIN A1c WITH eAG    TEST CODE: [id number]    CLIENT CONTACT: DR.Marymargaret Kirker    REPORT ALWAYS MESSAGE SIGNATURE         Assessment & Plan:   ***     Danelle Berry, PA-C 08/22/23 3:08 PM

## 2023-08-22 NOTE — Patient Instructions (Addendum)
 You can try tylenol and ibuprofen together (1000 mg and 400 mg) three to four times a day  Or you can try meloxicam (that is once daily do not take with ibuprofen - choose one or the other) Try the lyrica  James Medina - [place]  [address] [place], [place] [address] Phone: [phone]

## 2023-08-25 ENCOUNTER — Ambulatory Visit
Admission: RE | Admit: 2023-08-25 | Discharge: 2023-08-25 | Disposition: A | Discharge status: Home / Self Care | Source: Ambulatory Visit | Attending: Family Medicine | Admitting: Family Medicine

## 2023-08-25 DIAGNOSIS — M79604 Pain in right leg: Secondary | ICD-10-CM | POA: Diagnosis present

## 2023-09-01 NOTE — Addendum Note (Signed)
 Addended by: Danelle Berry on: 09/01/2023 12:54 PM   Modules accepted: Orders

## 2023-09-07 ENCOUNTER — Other Ambulatory Visit: Payer: Self-pay | Admitting: Orthopedic Surgery

## 2023-09-07 DIAGNOSIS — M4807 Spinal stenosis, lumbosacral region: Secondary | ICD-10-CM

## 2023-09-09 ENCOUNTER — Ambulatory Visit
Admission: RE | Admit: 2023-09-09 | Discharge: 2023-09-09 | Disposition: A | Discharge status: Home / Self Care | Source: Ambulatory Visit | Attending: Orthopedic Surgery | Admitting: Orthopedic Surgery

## 2023-09-09 DIAGNOSIS — M4807 Spinal stenosis, lumbosacral region: Secondary | ICD-10-CM | POA: Insufficient documentation

## 2023-09-27 ENCOUNTER — Encounter (INDEPENDENT_AMBULATORY_CARE_PROVIDER_SITE_OTHER): Payer: Self-pay | Admitting: Nurse Practitioner

## 2023-09-27 ENCOUNTER — Ambulatory Visit (INDEPENDENT_AMBULATORY_CARE_PROVIDER_SITE_OTHER): Admitting: Nurse Practitioner

## 2023-09-27 VITALS — BP 133/74 | HR 71 | Resp 16 | Ht 72.0 in | Wt 223.0 lb

## 2023-09-27 DIAGNOSIS — I712 Thoracic aortic aneurysm, without rupture, unspecified: Secondary | ICD-10-CM | POA: Diagnosis not present

## 2023-09-27 DIAGNOSIS — I10 Essential (primary) hypertension: Secondary | ICD-10-CM | POA: Diagnosis not present

## 2023-09-27 NOTE — Progress Notes (Signed)
 Subjective:    Patient ID: James Medina, male    DOB: May 26, 1946, 78 y.o.   MRN: [id number] Chief Complaint  Patient presents with   New Patient (Initial Visit)    Ref Angelica Chessman consult TAA    James Medina is a 78 y.o. male.  Previous  CT scan of the chest and he has a 3.96 cm ascending aorta which just qualifies to be called aneurysmal.  Since his last visit the patient was lost to follow-up.  He denies any chest pain or shortness of breath.  His largest issue is musculoskeletal in nature.  He has significant arthritis in his knees.  In addition he has been having pain from his right buttock that is shooting down his leg.  His primary care ordered ABIs which were noncompressible but noted normal tibial vessel waveforms bilaterally.  He currently denies any open wounds or ulcerations.    Review of Systems  Cardiovascular:  Negative for leg swelling.  Musculoskeletal:  Positive for arthralgias, back pain, gait problem and joint swelling.  All other systems reviewed and are negative.      Objective:   Physical Exam Vitals reviewed.  HENT:     Head: Normocephalic.  Cardiovascular:     Rate and Rhythm: Normal rate.  Pulmonary:     Effort: Pulmonary effort is normal.  Skin:    General: Skin is warm and dry.  Neurological:     Mental Status: He is alert and oriented to person, place, and time.  Psychiatric:        Mood and Affect: Mood normal.        Behavior: Behavior normal.        Thought Content: Thought content normal.        Judgment: Judgment normal.     BP 133/74   Pulse 71   Resp 16   Ht 6' (1.829 m)   Wt 223 lb (101.2 kg)   BMI 30.24 kg/m   Past Medical History:  Diagnosis Date   Alcoholism (HCC) 10/18/2017   ED (erectile dysfunction)    Hyperlipidemia    Hypertension    Impotence    Marijuana use 01/25/2018   OA (osteoarthritis)    hand   Obstructive sleep apnea 07/14/2018   Managed by pulmonologist   Sleep apnea     Social History    Socioeconomic History   Marital status: Widowed    Spouse name: Not on file   Number of children: 2   Years of education: Not on file   Highest education level: Some college, no degree  Occupational History   Not on file  Tobacco Use   Smoking status: Former    Current packs/day: 0.00    Average packs/day: 1 pack/day for 15.0 years (15.0 ttl pk-yrs)    Types: Cigars, Cigarettes    Start date: 04/06/1997    Quit date: 04/06/2012    Years since quitting: 11.4   Smokeless tobacco: Current    Types: Chew   Tobacco comments:    One can a day   Vaping Use   Vaping status: Never Used  Substance and Sexual Activity   Alcohol use: Not Currently    Comment: 2-3 drinks a night (vodka and sprite)   Drug use: Not Currently   Sexual activity: Yes  Other Topics Concern   Not on file  Social History Narrative   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    Social Drivers of  Health   Financial Resource Strain: Low Risk  (09/07/2023)   Received from [facility]   Overall Financial Resource Strain (CARDIA)    Difficulty of Paying Living Expenses: Not hard at all  Food Insecurity: No Food Insecurity (09/07/2023)   Received from [facility]   Hunger Vital Sign    Worried About Running Out of Food in the Last Year: Never true    Ran Out of Food in the Last Year: Never true  Transportation Needs: No Transportation Needs (09/07/2023)   Received from [facility] - Transportation    In the past 12 months, has lack of transportation kept you from medical appointments or from getting medications?: No    Lack of Transportation (Non-Medical): No  Physical Activity: Sufficiently Active (11/25/2022)   Exercise Vital Sign    Days of Exercise per Week: 4 days    Minutes of Exercise per Session: 40 min  Stress: No Stress Concern Present (11/25/2022)   [employer] of Occupational Health - Occupational Stress  Questionnaire    Feeling of Stress : Not at all  Social Connections: Socially Isolated (11/25/2022)   Social Connection and Isolation Panel [NHANES]    Frequency of Communication with Friends and Family: More than three times a week    Frequency of Social Gatherings with Friends and Family: Once a week    Attends Religious Services: Never    Database administrator or Organizations: No    Attends Banker Meetings: Never    Marital Status: Widowed  Intimate Partner Violence: Not At Risk (11/25/2022)   Humiliation, Afraid, Rape, and Kick questionnaire    Fear of Current or Ex-Partner: No    Emotionally Abused: No    Physically Abused: No    Sexually Abused: No    Past Surgical History:  Procedure Laterality Date   APPENDECTOMY     broken ankle     COLONOSCOPY WITH PROPOFOL N/A 08/27/2020   Procedure: COLONOSCOPY WITH PROPOFOL;  Surgeon: Pasty Spillers, MD;  Location: [facility] ENDOSCOPY;  Service: Endoscopy;  Laterality: N/A;   COLONOSCOPY WITH PROPOFOL N/A 08/30/2022   Procedure: COLONOSCOPY WITH PROPOFOL;  Surgeon: Toney Reil, MD;  Location: [facility] ENDOSCOPY;  Service: Gastroenterology;  Laterality: N/A;   FRACTURE SURGERY      Family History  Problem Relation Age of Onset   Cancer Mother        leukemia   Leukemia Mother    Heart disease Father    Heart disease Sister        pacemaker   Diabetes Brother    Stroke Brother    Diabetes Brother    Learning disabilities Maternal Uncle    Cancer Maternal Grandmother    Breast cancer Maternal Grandmother    Emphysema Maternal Grandfather    COPD Neg Hx    Hypertension Neg Hx     No Known Allergies     Latest Ref Rng & Units 07/21/2023    9:41 AM 03/25/2023   11:37 AM 01/19/2023    9:29 AM  CBC  WBC 3.8 - 10.8 Thousand/uL 8.1  10.5  10.1   Hemoglobin 13.2 - 17.1 g/dL 72.5  36.6  44.0   Hematocrit 38.5 - 50.0 % 50.4  46.7  57.0   Platelets 140 - 400 Thousand/uL 191  158  243       CMP      Component Value Date/Time   NA 139 07/21/2023  0941   NA 138 09/05/2015 0845   K 3.5 07/21/2023 0941   CL 98 07/21/2023 0941   CO2 29 07/21/2023 0941   GLUCOSE 134 (H) 07/21/2023 0941   BUN 9 07/21/2023 0941   BUN 11 09/05/2015 0845   CREATININE 0.72 07/21/2023 0941   CALCIUM 8.4 (L) 07/21/2023 0941   PROT 6.6 07/21/2023 0941   PROT 6.9 09/05/2015 0845   ALBUMIN 3.5 10/17/2017 1109   ALBUMIN 4.4 09/05/2015 0845   AST 52 (H) 07/21/2023 0941   ALT 27 07/21/2023 0941   ALKPHOS 103 10/17/2017 1109   BILITOT 1.4 (H) 07/21/2023 0941   BILITOT 1.1 09/05/2015 0845   EGFR 95 07/21/2023 0941   GFRNONAA 87 08/06/2020 1025     No results found.     Assessment & Plan:   1. Thoracic aortic aneurysm without rupture, unspecified part (HCC) (Primary) The patient's last evaluation of the thoracic abdominal aneurysm was done in 2019.  In reviewing his records I do not see any additional imaging that would show current sizing.  Will order a CT scan for the patient to evaluate.  Will plan on having him follow-up after it is done to review results.  In addition based upon the patient's pain and discomfort I suspect that his issues are more sciatic related versus true claudication based on his symptoms.  He is undergoing evaluation and just recently had an MRI done.  He is advised that if his workup is nonrevealing then we can possibly order additional imaging, but based on his most recent studies the seems to have adequate perfusion. - CT CHEST WO CONTRAST; Future  2. Essential hypertension Continue antihypertensive medications as already ordered, these medications have been reviewed and there are no changes at this time.   Current Outpatient Medications on File Prior to Visit  Medication Sig Dispense Refill   atorvastatin (LIPITOR) 10 MG tablet TAKE 1 TABLET BY MOUTH EVERY OTHER DAY IN THE EVENING 45 tablet 3   benazepril (LOTENSIN) 40 MG tablet Take 1 tablet (40 mg total) by mouth daily. 90  tablet 1   colchicine 0.6 MG tablet Take two pills at the onset of a gout attack; take one more pill one hour later 3 tablet 5   folic acid (FOLVITE) 1 MG tablet Take 1 tablet (1 mg total) by mouth daily. 90 tablet 0   meloxicam (MOBIC) 15 MG tablet Take 1 tablet (15 mg total) by mouth daily. 30 tablet 1   pregabalin (LYRICA) 25 MG capsule Take 1-2 capsules (25-50 mg total) by mouth 2 (two) times daily as needed (msk pain). 180 capsule 0   sildenafil (VIAGRA) 25 MG tablet Take 1-2 tablets by mouth daily as needed for erectile dysfunction. 10 tablet 2   thiamine (VITAMIN B1) 100 MG tablet Take 1 tablet (100 mg total) by mouth daily. 90 tablet 0   No current facility-administered medications on file prior to visit.    There are no Patient Instructions on file for this visit. No follow-ups on file.   Georgiana Spinner, NP

## 2023-09-28 ENCOUNTER — Encounter (INDEPENDENT_AMBULATORY_CARE_PROVIDER_SITE_OTHER): Payer: Self-pay | Admitting: Nurse Practitioner

## 2023-10-05 DIAGNOSIS — M170 Bilateral primary osteoarthritis of knee: Secondary | ICD-10-CM | POA: Diagnosis not present

## 2023-10-05 DIAGNOSIS — M160 Bilateral primary osteoarthritis of hip: Secondary | ICD-10-CM | POA: Diagnosis not present

## 2023-10-06 ENCOUNTER — Other Ambulatory Visit: Payer: Self-pay | Admitting: Orthopedic Surgery

## 2023-10-06 DIAGNOSIS — M160 Bilateral primary osteoarthritis of hip: Secondary | ICD-10-CM

## 2023-10-07 ENCOUNTER — Ambulatory Visit
Admission: RE | Admit: 2023-10-07 | Discharge: 2023-10-07 | Disposition: A | Discharge status: Home / Self Care | Source: Ambulatory Visit | Attending: Nurse Practitioner | Admitting: Nurse Practitioner

## 2023-10-07 DIAGNOSIS — I700 Atherosclerosis of aorta: Secondary | ICD-10-CM | POA: Diagnosis not present

## 2023-10-07 DIAGNOSIS — I712 Thoracic aortic aneurysm, without rupture, unspecified: Secondary | ICD-10-CM | POA: Diagnosis not present

## 2023-10-14 ENCOUNTER — Ambulatory Visit
Admission: RE | Admit: 2023-10-14 | Discharge: 2023-10-14 | Disposition: A | Discharge status: Home / Self Care | Source: Ambulatory Visit | Attending: Orthopedic Surgery | Admitting: Orthopedic Surgery

## 2023-10-14 DIAGNOSIS — M160 Bilateral primary osteoarthritis of hip: Secondary | ICD-10-CM | POA: Insufficient documentation

## 2023-10-14 DIAGNOSIS — M1611 Unilateral primary osteoarthritis, right hip: Secondary | ICD-10-CM | POA: Diagnosis not present

## 2023-10-14 DIAGNOSIS — M25451 Effusion, right hip: Secondary | ICD-10-CM | POA: Diagnosis not present

## 2023-10-14 DIAGNOSIS — M25551 Pain in right hip: Secondary | ICD-10-CM | POA: Diagnosis not present

## 2023-10-14 DIAGNOSIS — R609 Edema, unspecified: Secondary | ICD-10-CM | POA: Diagnosis not present

## 2023-11-08 ENCOUNTER — Encounter (INDEPENDENT_AMBULATORY_CARE_PROVIDER_SITE_OTHER): Payer: Self-pay

## 2023-11-10 ENCOUNTER — Other Ambulatory Visit: Payer: Self-pay | Admitting: Family Medicine

## 2023-11-10 DIAGNOSIS — D7589 Other specified diseases of blood and blood-forming organs: Secondary | ICD-10-CM

## 2023-11-10 DIAGNOSIS — E538 Deficiency of other specified B group vitamins: Secondary | ICD-10-CM

## 2023-11-11 NOTE — Telephone Encounter (Signed)
 Requested Prescriptions  Pending Prescriptions Disp Refills   folic acid  (FOLVITE ) 1 MG tablet [Pharmacy Med Name: folic acid  1 mg tablet] 90 tablet 0    Sig: TAKE 1 TABLET BY MOUTH DAILY     Endocrinology:  Vitamins Failed - 11/11/2023 12:18 PM      Failed - Valid encounter within last 12 months    Recent Outpatient Visits           2 months ago Osteoarthritis of both knees, unspecified osteoarthritis type   [facility] Adeline Hone, PA-C       Future Appointments             In 2 months Adeline Hone, PA-C [facility], [facility]

## 2023-11-29 DIAGNOSIS — M25551 Pain in right hip: Secondary | ICD-10-CM | POA: Diagnosis not present

## 2023-11-29 DIAGNOSIS — M1611 Unilateral primary osteoarthritis, right hip: Secondary | ICD-10-CM | POA: Diagnosis not present

## 2023-12-01 ENCOUNTER — Ambulatory Visit (INDEPENDENT_AMBULATORY_CARE_PROVIDER_SITE_OTHER): Payer: Medicare Other

## 2023-12-01 DIAGNOSIS — Z0000 Encounter for general adult medical examination without abnormal findings: Secondary | ICD-10-CM | POA: Diagnosis not present

## 2023-12-01 DIAGNOSIS — Z8739 Personal history of other diseases of the musculoskeletal system and connective tissue: Secondary | ICD-10-CM

## 2023-12-01 MED ORDER — COLCHICINE 0.6 MG PO TABS: ORAL_TABLET | ORAL | 5 refills | Status: AC

## 2023-12-01 NOTE — Progress Notes (Signed)
 Subjective:   James Medina is a 78 y.o. who presents for a Medicare Wellness preventive visit.  As a reminder, Annual Wellness Visits don't include a physical exam, and some assessments may be limited, especially if this visit is performed virtually. We may recommend an in-person follow-up visit with your provider if needed.  Visit Complete: Virtual I connected with  James Medina on 12/01/23 by a audio enabled telemedicine application and verified that I am speaking with the correct person using two identifiers.  Patient Location: Home  Provider Location: Office/Clinic  I discussed the limitations of evaluation and management by telemedicine. The patient expressed understanding and agreed to proceed.  Vital Signs: Because this visit was a virtual/telehealth visit, some criteria may be missing or patient reported. Any vitals not documented were not able to be obtained and vitals that have been documented are patient reported.  VideoDeclined- This patient declined Librarian, academic. Therefore the visit was completed with audio only.  Persons Participating in Visit: Patient.  AWV Questionnaire: No: Patient Medicare AWV questionnaire was not completed prior to this visit.  Cardiac Risk Factors include: advanced age (>59men, >69 women);dyslipidemia;male gender;hypertension;sedentary lifestyle;obesity (BMI >30kg/m2)     Objective:    Today's Vitals   12/01/23 1247  PainSc: 4    There is no height or weight on file to calculate BMI.     12/01/2023   12:59 PM 03/25/2023   11:11 AM 11/25/2022    1:43 PM 08/30/2022    8:37 AM 11/03/2021   11:20 AM 10/23/2020   10:21 AM 08/27/2020    9:14 AM  Advanced Directives  Does Patient Have a Medical Advance Directive? No No No No Yes Yes Yes  Type of Agricultural consultant;Living will Healthcare Power of [place];Living will Living will;Healthcare Power of Attorney  Copy of Healthcare  Power of Attorney in Chart?     No - copy requested No - copy requested No - copy requested  Would patient like information on creating a medical advance directive? No - Patient declined No - Patient declined         Current Medications (verified) Outpatient Encounter Medications as of 12/01/2023  Medication Sig   atorvastatin  (LIPITOR) 10 MG tablet TAKE 1 TABLET BY MOUTH EVERY OTHER DAY IN THE EVENING   benazepril  (LOTENSIN ) 40 MG tablet Take 1 tablet (40 mg total) by mouth daily.   folic acid  (FOLVITE ) 1 MG tablet TAKE 1 TABLET BY MOUTH DAILY   thiamine  (VITAMIN B1) 100 MG tablet Take 1 tablet (100 mg total) by mouth daily.   [DISCONTINUED] colchicine  0.6 MG tablet Take two pills at the onset of a gout attack; take one more pill one hour later   colchicine  0.6 MG tablet Take two pills at the onset of a gout attack; take one more pill one hour later   meloxicam  (MOBIC ) 15 MG tablet Take 1 tablet (15 mg total) by mouth daily. (Patient not taking: Reported on 12/01/2023)   pregabalin  (LYRICA ) 25 MG capsule Take 1-2 capsules (25-50 mg total) by mouth 2 (two) times daily as needed (msk pain).   sildenafil  (VIAGRA ) 25 MG tablet Take 1-2 tablets by mouth daily as needed for erectile dysfunction. (Patient not taking: Reported on 12/01/2023)   No facility-administered encounter medications on file as of 12/01/2023.    Allergies (verified) Patient has no known allergies.   History: Past Medical History:  Diagnosis Date   Alcoholism (HCC)  10/18/2017   ED (erectile dysfunction)    Hyperlipidemia    Hypertension    Impotence    Marijuana use 01/25/2018   OA (osteoarthritis)    hand   Obstructive sleep apnea 07/14/2018   Managed by pulmonologist   Sleep apnea    Past Surgical History:  Procedure Laterality Date   APPENDECTOMY     broken ankle     COLONOSCOPY WITH PROPOFOL  N/A 08/27/2020   Procedure: COLONOSCOPY WITH PROPOFOL ;  Surgeon: Irby Mannan, MD;  Location: [facility] ENDOSCOPY;   Service: Endoscopy;  Laterality: N/A;   COLONOSCOPY WITH PROPOFOL  N/A 08/30/2022   Procedure: COLONOSCOPY WITH PROPOFOL ;  Surgeon: Selena Daily, MD;  [facility] ENDOSCOPY;  Service: Gastroenterology;  Laterality: N/A;   FRACTURE SURGERY     Family History  Problem Relation Age of Onset   Cancer Mother        leukemia   Leukemia Mother    Heart disease Father    Heart disease Sister        pacemaker   Diabetes Brother    Stroke Brother    Diabetes Brother    Learning disabilities Maternal Uncle    Cancer Maternal Grandmother    Breast cancer Maternal Grandmother    Emphysema Maternal Grandfather    COPD Neg Hx    Hypertension Neg Hx    Social History   Socioeconomic History   Marital status: Widowed    Spouse name: Not on file   Number of children: 2   Years of education: Not on file   Highest education level: Some college, no degree  Occupational History   Not on file  Tobacco Use   Smoking status: Former    Current packs/day: 0.00    Average packs/day: 1 pack/day for 15.0 years (15.0 ttl pk-yrs)    Types: Cigars, Cigarettes    Start date: 04/06/1997    Quit date: 04/06/2012    Years since quitting: 11.6   Smokeless tobacco: Current    Types: Chew   Tobacco comments:    One can a day   Vaping Use   Vaping status: Never Used  Substance and Sexual Activity   Alcohol  use: Not Currently    Comment: 2-3 drinks a night (vodka and sprite)   Drug use: Not Currently   Sexual activity: Yes  Other Topics Concern   Not on file  Social History Narrative   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    Social Drivers of Corporate investment banker Strain: Low Risk  (12/01/2023)   Overall Financial Resource Strain (CARDIA)    Difficulty of Paying Living Expenses: Not hard at all  Food Insecurity: No Food Insecurity (12/01/2023)   Hunger Vital Sign    Worried About Running Out of Food in the Last Year: Never true    Ran Out of Food in the  Last Year: Never true  Transportation Needs: No Transportation Needs (12/01/2023)   PRAPARE - Administrator, Civil Service (Medical): No    Lack of Transportation (Non-Medical): No  Physical Activity: Insufficiently Active (12/01/2023)   Exercise Vital Sign    Days of Exercise per Week: 2 days    Minutes of Exercise per Session: 20 min  Stress: No Stress Concern Present (12/01/2023)   [employer] of Occupational Health - Occupational Stress Questionnaire    Feeling of Stress: Not at all  Social Connections: Socially Isolated (12/01/2023)   Social Connection and Isolation  Panel    Frequency of Communication with Friends and Family: More than three times a week    Frequency of Social Gatherings with Friends and Family: Never    Attends Religious Services: Never    Database administrator or Organizations: No    Attends Banker Meetings: Never    Marital Status: Widowed    Tobacco Counseling Ready to quit: Not Answered Counseling given: Not Answered Tobacco comments: One can a day     Clinical Intake:  Pre-visit preparation completed: Yes  Pain : 0-10 Pain Score: 4  Pain Type: Chronic pain Pain Location: Hip Pain Orientation: Right Pain Descriptors / Indicators: Aching, Discomfort, Constant Pain Onset: More than a month ago Pain Frequency: Constant Pain Relieving Factors: HAD SHOT IN IT YESTERDAY  Pain Relieving Factors: HAD SHOT IN IT YESTERDAY  BMI - recorded: 30.2 Nutritional Status: BMI > 30  Obese Nutritional Risks: None Diabetes: No  Lab Results  Component Value Date   HGBA1C 6.5 (H) 07/21/2023   HGBA1C 5.9 (H) 01/19/2023   HGBA1C 5.7 (H) 07/09/2022     How often do you need to have someone help you when you read instructions, pamphlets, or other written materials from your doctor or pharmacy?: 1 - Never  Interpreter Needed?: No  Information entered by :: Dellie Fergusson, LPN   Activities of Daily Living     12/01/2023     1:00 PM 01/19/2023    8:56 AM  In your present state of health, do you have any difficulty performing the following activities:  Hearing? 0 0  Vision? 0 1  Difficulty concentrating or making decisions? 0 0  Walking or climbing stairs? 1 1  Comment HIP, KNEE PAIN   Dressing or bathing? 0 0  Doing errands, shopping? 0 0  Preparing Food and eating ? N   Using the Toilet? N   In the past six months, have you accidently leaked urine? N   Do you have problems with loss of bowel control? N   Managing your Medications? N   Managing your Finances? N   Housekeeping or managing your Housekeeping? N     Patient Care Team: Tapia, Leisa, PA-C as PCP - General (Family Medicine) Timmy Forbes, MD as Consulting Physician (Oncology)  I have updated your Care Teams any recent Medical Services you may have received from other providers in the past year.     Assessment:   This is a routine wellness examination for James Medina.  Hearing/Vision screen Hearing Screening - Comments:: NO AIDS Vision Screening - Comments:: WEARS GLASSES ALL DAY- MD IN [facility]   Goals Addressed             This Visit's Progress    DIET - EAT MORE FRUITS AND VEGETABLES         Depression Screen     12/01/2023   12:56 PM 01/19/2023    8:56 AM 11/25/2022    1:40 PM 07/09/2022    1:24 PM 04/30/2022    9:21 AM 04/02/2022   10:11 AM 12/31/2021    8:14 AM  PHQ 2/9 Scores  PHQ - 2 Score 0 0 0 0 0 0 0  PHQ- 9 Score 0 0  0 0 0 0    Fall Risk     12/01/2023    1:00 PM 01/19/2023    8:56 AM 11/25/2022    1:36 PM 07/09/2022    1:24 PM 04/30/2022    9:21 AM  Fall Risk  Falls in the past year? 0 0 0 0 0  Number falls in past yr: 0  0 0 0  Injury with Fall? 0  0 0 0  Risk for fall due to : No Fall Risks No Fall Risks No Fall Risks No Fall Risks No Fall Risks  Follow up Falls evaluation completed Falls prevention discussed Education provided;Falls prevention discussed Falls prevention discussed;Education provided;Falls  evaluation completed  Falls prevention discussed;Education provided;Falls evaluation completed      Data saved with a previous flowsheet row definition    MEDICARE RISK AT HOME:  Medicare Risk at Home Any stairs in or around the home?: Yes If so, are there any without handrails?: Yes Home free of loose throw rugs in walkways, pet beds, electrical cords, etc?: Yes Adequate lighting in your home to reduce risk of falls?: Yes Life alert?: No Use of a cane, walker or w/c?: No Grab bars in the bathroom?: Yes Shower chair or bench in shower?: No Elevated toilet seat or a handicapped toilet?: Yes  TIMED UP AND GO:  Was the test performed?  No  Cognitive Function: 6CIT completed        12/01/2023    1:02 PM 11/25/2022    1:47 PM 09/20/2019    8:37 AM 09/14/2018    2:13 PM  6CIT Screen  What Year? 0 points 0 points 0 points 0 points  What month? 0 points 0 points 0 points 0 points  What time? 0 points 0 points 0 points 0 points  Count back from 20 2 points 0 points 0 points 0 points  Months in reverse 0 points 0 points 0 points 0 points  Repeat phrase 2 points 0 points 2 points 0 points  Total Score 4 points 0 points 2 points 0 points    Immunizations Immunization History  Administered Date(s) Administered   Fluad Quad(high Dose 65+) 03/22/2019, 03/13/2020, 03/18/2022   Influenza, High Dose Seasonal PF 03/25/2017   Influenza-Unspecified 04/21/2021   Moderna Sars-Covid-2 Vaccination 10/26/2019, 11/23/2019, 05/27/2020   PNEUMOCOCCAL CONJUGATE-20 06/30/2021   Tdap 06/21/2009, 10/17/2017   Zoster Recombinant(Shingrix) 05/02/2019, 07/21/2019    Screening Tests Health Maintenance  Topic Date Due   COVID-19 Vaccine (4 - 2024-25 season) 02/20/2023   INFLUENZA VACCINE  01/20/2024   Colonoscopy  08/29/2024   Medicare Annual Wellness (AWV)  11/30/2024   DTaP/Tdap/Td (3 - Td or Tdap) 10/18/2027   Pneumococcal Vaccine: 50+ Years  Completed   Hepatitis C Screening  Completed   Zoster  Vaccines- Shingrix  Completed   HPV VACCINES  Aged Out   Meningococcal B Vaccine  Aged Out    Health Maintenance  Health Maintenance Due  Topic Date Due   COVID-19 Vaccine (4 - 2024-25 season) 02/20/2023   Health Maintenance Items Addressed: UP TO DATE ON COLONOSCOPY; UP TO DATE ON SHOTS EXCEPT COVID  Additional Screening:  Vision Screening: Recommended annual ophthalmology exams for early detection of glaucoma and other disorders of the eye. Would you like a referral to an eye doctor? No    Dental Screening: Recommended annual dental exams for proper oral hygiene  Community Resource Referral / Chronic Care Management: CRR required this visit?  No   CCM required this visit?  No   Plan:    I have personally reviewed and noted the following in the patient's chart:   Medical and social history Use of alcohol , tobacco or illicit drugs  Current medications and supplements including opioid prescriptions. Patient is not currently taking opioid  prescriptions. Functional ability and status Nutritional status Physical activity Advanced directives List of other physicians Hospitalizations, surgeries, and ER visits in previous 12 months Vitals Screenings to include cognitive, depression, and falls Referrals and appointments  In addition, I have reviewed and discussed with patient certain preventive protocols, quality metrics, and best practice recommendations. A written personalized care plan for preventive services as well as general preventive health recommendations were provided to patient.   Pinky Bright, LPN   [id number]   After Visit Summary: (MyChart) Due to this being a telephonic visit, the after visit summary with patients personalized plan was offered to patient via MyChart   Notes: Nothing significant to report at this time.

## 2023-12-01 NOTE — Patient Instructions (Addendum)
 James Medina , Thank you for taking time out of your busy schedule to complete your Annual Wellness Visit with me. I enjoyed our conversation and look forward to speaking with you again next year. I, as well as your care team,  appreciate your ongoing commitment to your health goals. Please review the following plan we discussed and let me know if I can assist you in the future.   Follow up Visits: Next Medicare AWV with our clinical staff:   12/13/24 @ 10:10 AM BY PHONE Have you seen your provider in the last 6 months (3 months if uncontrolled diabetes)? Yes  Clinician Recommendations:  Aim for 30 minutes of exercise or brisk walking, 6-8 glasses of water, and 5 servings of fruits and vegetables each day. TAKE CARE!      This is a list of the screening recommended for you and due dates:  Health Maintenance  Topic Date Due   COVID-19 Vaccine (4 - 2024-25 season) 02/20/2023   Flu Shot  01/20/2024   Colon Cancer Screening  08/29/2024   Medicare Annual Wellness Visit  11/30/2024   DTaP/Tdap/Td vaccine (3 - Td or Tdap) 10/18/2027   Pneumococcal Vaccine for age over 70  Completed   Hepatitis C Screening  Completed   Zoster (Shingles) Vaccine  Completed   HPV Vaccine  Aged Out   Meningitis B Vaccine  Aged Out    Advanced directives: (ACP Link)Information on Advanced Care Planning can be found at [place]  Best boy Advance Health Care Directives Advance Health Care Directives. [email or web address]  Advance Care Planning is important because it:  [x]  Makes sure you receive the medical care that is consistent with your values, goals, and preferences  [x]  It provides guidance to your family and loved ones and reduces their decisional burden about whether or not they are making the right decisions based on your wishes.  Follow the link provided in your after visit summary or read over the paperwork we have mailed to you to help you started getting your Advance Directives in place. If you need  assistance in completing these, please reach out to us  so that we can help you!

## 2024-01-09 ENCOUNTER — Other Ambulatory Visit: Payer: Self-pay | Admitting: Family Medicine

## 2024-01-09 DIAGNOSIS — M25551 Pain in right hip: Secondary | ICD-10-CM

## 2024-01-09 DIAGNOSIS — M170 Bilateral primary osteoarthritis of knee: Secondary | ICD-10-CM

## 2024-01-11 NOTE — Telephone Encounter (Signed)
 Requested medication (s) are due for refill today: yes  Requested medication (s) are on the active medication list: yes  Last refill:  08/22/23 #30 1 RF  Future visit scheduled: yes  Notes to clinic:  abnormal lab work    Requested Prescriptions  Pending Prescriptions Disp Refills   meloxicam  (MOBIC ) 15 MG tablet [Pharmacy Med Name: meloxicam  15 mg tablet] 30 tablet 1    Sig: TAKE 1 TABLET BY MOUTH DAILY     Analgesics:  COX2 Inhibitors Failed - 01/11/2024 11:42 AM      Failed - Manual Review: Labs are only required if the patient has taken medication for more than 8 weeks.      Failed - HGB in normal range and within 360 days    Hemoglobin  Date Value Ref Range Status  07/21/2023 18.2 (H) 13.2 - 17.1 g/dL Final  96/82/7982 84.9 12.6 - 17.7 g/dL Final         Failed - HCT in normal range and within 360 days    HCT  Date Value Ref Range Status  07/21/2023 50.4 (H) 38.5 - 50.0 % Final   Hematocrit  Date Value Ref Range Status  09/05/2015 44.4 37.5 - 51.0 % Final         Failed - AST in normal range and within 360 days    AST  Date Value Ref Range Status  07/21/2023 52 (H) 10 - 35 U/L Final         Passed - Cr in normal range and within 360 days    Creat  Date Value Ref Range Status  07/21/2023 0.72 0.70 - 1.28 mg/dL Final         Passed - ALT in normal range and within 360 days    ALT  Date Value Ref Range Status  07/21/2023 27 9 - 46 U/L Final         Passed - eGFR is 30 or above and within 360 days    GFR, Est African American  Date Value Ref Range Status  08/06/2020 101 > OR = 60 mL/min/1.15m2 Final   GFR, Est Non African American  Date Value Ref Range Status  08/06/2020 87 > OR = 60 mL/min/1.91m2 Final   eGFR  Date Value Ref Range Status  07/21/2023 95 > OR = 60 mL/min/1.73m2 Final         Passed - Patient is not pregnant      Passed - Valid encounter within last 12 months    Recent Outpatient Visits           4 months ago Osteoarthritis of  both knees, unspecified osteoarthritis type   [facility] Leavy Mole, PA-C       Future Appointments             In 2 weeks Leavy Mole, PA-C [facility], [facility]

## 2024-01-18 ENCOUNTER — Ambulatory Visit: Payer: Self-pay | Admitting: Family Medicine

## 2024-01-30 ENCOUNTER — Ambulatory Visit: Admitting: Family Medicine

## 2024-01-30 ENCOUNTER — Encounter: Payer: Self-pay | Admitting: Family Medicine

## 2024-01-30 VITALS — BP 134/72 | HR 68 | Resp 16 | Ht 72.0 in | Wt 223.0 lb

## 2024-01-30 DIAGNOSIS — I712 Thoracic aortic aneurysm, without rupture, unspecified: Secondary | ICD-10-CM

## 2024-01-30 DIAGNOSIS — D582 Other hemoglobinopathies: Secondary | ICD-10-CM

## 2024-01-30 DIAGNOSIS — D7589 Other specified diseases of blood and blood-forming organs: Secondary | ICD-10-CM

## 2024-01-30 DIAGNOSIS — E782 Mixed hyperlipidemia: Secondary | ICD-10-CM | POA: Diagnosis not present

## 2024-01-30 DIAGNOSIS — M79604 Pain in right leg: Secondary | ICD-10-CM

## 2024-01-30 DIAGNOSIS — L989 Disorder of the skin and subcutaneous tissue, unspecified: Secondary | ICD-10-CM

## 2024-01-30 DIAGNOSIS — F1020 Alcohol dependence, uncomplicated: Secondary | ICD-10-CM

## 2024-01-30 DIAGNOSIS — E538 Deficiency of other specified B group vitamins: Secondary | ICD-10-CM | POA: Diagnosis not present

## 2024-01-30 DIAGNOSIS — M170 Bilateral primary osteoarthritis of knee: Secondary | ICD-10-CM

## 2024-01-30 DIAGNOSIS — I10 Essential (primary) hypertension: Secondary | ICD-10-CM | POA: Diagnosis not present

## 2024-01-30 DIAGNOSIS — E119 Type 2 diabetes mellitus without complications: Secondary | ICD-10-CM

## 2024-01-30 DIAGNOSIS — M25551 Pain in right hip: Secondary | ICD-10-CM

## 2024-01-30 DIAGNOSIS — L84 Corns and callosities: Secondary | ICD-10-CM

## 2024-01-30 MED ORDER — PREGABALIN 25 MG PO CAPS: 25.0000 mg | ORAL_CAPSULE | Freq: Two times a day (BID) | ORAL | 1 refills | Status: AC | PRN

## 2024-01-30 MED ORDER — FOLIC ACID 1 MG PO TABS: 1.0000 mg | ORAL_TABLET | Freq: Every day | ORAL | 1 refills | Status: AC

## 2024-01-30 MED ORDER — ATORVASTATIN CALCIUM 10 MG PO TABS: ORAL_TABLET | ORAL | 1 refills | Status: AC

## 2024-01-30 MED ORDER — BENAZEPRIL HCL 40 MG PO TABS
40.0000 mg | ORAL_TABLET | Freq: Every day | ORAL | 1 refills | Status: AC
Start: 2024-01-30 — End: ?

## 2024-01-30 NOTE — Assessment & Plan Note (Signed)
 Not supplementing and no decrease in ETOH use

## 2024-01-30 NOTE — Assessment & Plan Note (Signed)
 New onset DM with last labs, pt is on statin and ACEI DM foot exam done today as well as additional labs and pt referred for DM eye exam Explained goals to keep A1c under 7.0 Currently not on meds Recent pertinent labs: Lab Results  Component Value Date   HGBA1C 6.5 (H) 07/21/2023   HGBA1C 5.9 (H) 01/19/2023   HGBA1C 5.7 (H) 07/09/2022

## 2024-01-30 NOTE — Assessment & Plan Note (Signed)
 Pt continues to drink same amount of alcohol  daily - several shots of vodka Encouraged to reduce use

## 2024-01-30 NOTE — Assessment & Plan Note (Signed)
 OA/chronic hip pain now managed by Dr. Lorelle at [facility]. Reviewed his last note and imaging, injection done June - pt to follow up with Dr. DELENA.  He only got brief pain relief from hip injection and is hesitant about hip replacement

## 2024-01-30 NOTE — Assessment & Plan Note (Signed)
 Limited statin tolerance  On atorvastatin  every other day, labs checked 6 months ago, refills on meds today Continue med, diet/lifestyle efforts

## 2024-01-30 NOTE — Assessment & Plan Note (Signed)
 Pt managed on benazepril  40 mg with BP near goal today, no changes, continue med, diet/lifestyle efforts BP Readings from Last 3 Encounters:  01/30/24 134/72  09/27/23 133/74  08/22/23 126/74

## 2024-01-30 NOTE — Progress Notes (Unsigned)
 Name: James Medina   MRN: [id number]    DOB: [birth year]   Date:01/30/2024       Progress Note  Chief Complaint  Patient presents with   Medical Management of Chronic Issues    6 month follow-up   Hypertension   Hyperlipidemia   Prediabetes    Subjective:   James Medina is a 78 y.o. male, presents to clinic for routine follow up on chronic conditions  New onset T2DM not on meds  He needs cholesterol med refilled, ran out, can only tolerate taking every other day Lab Results  Component Value Date   CHOL 136 07/21/2023   HDL 42 07/21/2023   LDLCALC 77 07/21/2023   TRIG 86 07/21/2023   CHOLHDL 3.2 07/21/2023   Discussed the use of AI scribe software for clinical note transcription with the patient, who gave verbal consent to proceed.  History of Present Illness James Medina is a 78 year old male with hypertension, hyperlipidemia, prediabetes, alcohol  dependence with folate deficiency, and macrocytosis who presents for a six-month routine follow-up.  Arthralgia and joint pain - Ongoing pain in the knees and hips, with greater severity in the left knee - Received an injection in the hip with temporary relief, but pain has recurred - Under the care of an orthopedic specialist  Glycemic control and neuropathy screening - Hemoglobin A1c checked six months ago was in the type 2 diabetes range, suggesting possible new onset diabetes - No numbness, pins and needles, tingling, or burning in the feet [facility] podiatric care for nail maintenance - Presence of a callus on the foot  Alcohol  use and hematologic abnormalities - Continues to consume alcohol  at the same level as previously - Not taking any supplements - Folate deficiency and macrocytosis being monitored; labs to be rechecked today  Pruritic skin lesions - Pruritic lesions on the chest resembling calluses rather than moles - No evaluation by a dermatologist for these lesions     Current Outpatient  Medications:    atorvastatin  (LIPITOR) 10 MG tablet, TAKE 1 TABLET BY MOUTH EVERY OTHER DAY IN THE EVENING, Disp: 45 tablet, Rfl: 3   benazepril  (LOTENSIN ) 40 MG tablet, Take 1 tablet (40 mg total) by mouth daily., Disp: 90 tablet, Rfl: 1   colchicine  0.6 MG tablet, Take two pills at the onset of a gout attack; take one more pill one hour later, Disp: 3 tablet, Rfl: 5   folic acid  (FOLVITE ) 1 MG tablet, TAKE 1 TABLET BY MOUTH DAILY, Disp: 90 tablet, Rfl: 0   meloxicam  (MOBIC ) 15 MG tablet, TAKE 1 TABLET BY MOUTH DAILY, Disp: 30 tablet, Rfl: 1   pregabalin  (LYRICA ) 25 MG capsule, Take 1-2 capsules (25-50 mg total) by mouth 2 (two) times daily as needed (msk pain)., Disp: 180 capsule, Rfl: 0   sildenafil  (VIAGRA ) 25 MG tablet, Take 1-2 tablets by mouth daily as needed for erectile dysfunction., Disp: 10 tablet, Rfl: 2   thiamine  (VITAMIN B1) 100 MG tablet, Take 1 tablet (100 mg total) by mouth daily., Disp: 90 tablet, Rfl: 0  Patient Active Problem List   Diagnosis Date Noted   Folate deficiency 07/22/2023   Thiamine  deficiency 07/22/2023   Secondary erythrocytosis 03/25/2023   History of colonic polyps 08/30/2022   Rectal polyp 08/30/2022   Polyp of descending colon 08/30/2022   History of gout 12/31/2021   Elevated LFTs 10/24/2019   Prediabetes 08/03/2019   Obstructive sleep apnea 07/14/2018   Pulmonary nodule 07/14/2018  Osteoarthritis of knee 04/04/2018   Thoracic aortic aneurysm without rupture (HCC) 01/25/2018   Alcohol  dependence, daily use (HCC) 10/18/2017   Osteoarthritis of both hands 10/18/2017   Gout 03/25/2017   Elevated hemoglobin (HCC) 07/13/2016   Umbilical hernia without obstruction or gangrene 04/26/2016   Impaired fasting glucose 09/16/2015   Macrocytosis without anemia 09/16/2015   Hematospermia 09/05/2015   Abnormal CBC 06/08/2015   Mixed hyperlipidemia    Essential hypertension    ED (erectile dysfunction)    Impotence     Past Surgical History:   Procedure Laterality Date   APPENDECTOMY     broken ankle     COLONOSCOPY WITH PROPOFOL  N/A 08/27/2020   Procedure: COLONOSCOPY WITH PROPOFOL ;  Surgeon: Janalyn Keene NOVAK, MD;  Location: [facility] ENDOSCOPY;  Service: Endoscopy;  Laterality: N/A;   COLONOSCOPY WITH PROPOFOL  N/A 08/30/2022   Procedure: COLONOSCOPY WITH PROPOFOL ;  Surgeon: Unk Corinn Skiff, MD;  Location: [facility] ENDOSCOPY;  Service: Gastroenterology;  Laterality: N/A;   FRACTURE SURGERY      Family History  Problem Relation Age of Onset   Cancer Mother        leukemia   Leukemia Mother    Heart disease Father    Heart disease Sister        pacemaker   Diabetes Brother    Stroke Brother    Diabetes Brother    Learning disabilities Maternal Uncle    Cancer Maternal Grandmother    Breast cancer Maternal Grandmother    Emphysema Maternal Grandfather    COPD Neg Hx    Hypertension Neg Hx     Social History   Tobacco Use   Smoking status: Former    Current packs/day: 0.00    Average packs/day: 1 pack/day for 15.0 years (15.0 ttl pk-yrs)    Types: Cigars, Cigarettes    Start date: 04/06/1997    Quit date: 04/06/2012    Years since quitting: 11.8   Smokeless tobacco: Current    Types: Chew   Tobacco comments:    One can a day   Vaping Use   Vaping status: Never Used  Substance Use Topics   Alcohol  use: Not Currently    Comment: 2-3 drinks a night (vodka and sprite)   Drug use: Not Currently     No Known Allergies  Health Maintenance  Topic Date Due   COVID-19 Vaccine (4 - 2024-25 season) 02/15/2024 (Originally 02/20/2023)   INFLUENZA VACCINE  09/18/2024 (Originally 01/20/2024)   Colonoscopy  08/29/2024   Medicare Annual Wellness (AWV)  11/30/2024   DTaP/Tdap/Td (3 - Td or Tdap) 10/18/2027   Pneumococcal Vaccine: 50+ Years  Completed   Hepatitis C Screening  Completed   Zoster Vaccines- Shingrix  Completed   Hepatitis B Vaccines  Aged Out   HPV VACCINES  Aged Out   Meningococcal B Vaccine  Aged Out     Chart Review Today: I personally reviewed active problem list, medication list, allergies, family history, social history, health maintenance, notes from last encounter, lab results, imaging with the patient/caregiver today.   Review of Systems  Constitutional: Negative.   HENT: Negative.    Eyes: Negative.   Respiratory: Negative.    Cardiovascular: Negative.   Gastrointestinal: Negative.   Endocrine: Negative.   Genitourinary: Negative.   Musculoskeletal: Negative.   Skin: Negative.   Allergic/Immunologic: Negative.   Neurological: Negative.   Hematological: Negative.   Psychiatric/Behavioral: Negative.    All other systems reviewed and are negative.    Objective:  Vitals:   01/30/24 1023  BP: 134/72  Pulse: 68  Resp: 16  SpO2: 94%  Weight: 223 lb (101.2 kg)  Height: 6' (1.829 m)    Body mass index is 30.24 kg/m.  Physical Exam Vitals and nursing note reviewed.  Constitutional:      General: He is not in acute distress.    Appearance: Normal appearance. He is well-developed and overweight. He is not ill-appearing, toxic-appearing or diaphoretic.  HENT:     Head: Normocephalic and atraumatic.     Right Ear: External ear normal.     Left Ear: External ear normal.     Nose: Nose normal.  Eyes:     General: No scleral icterus.       Right eye: No discharge.        Left eye: No discharge.     Conjunctiva/sclera: Conjunctivae normal.  Neck:     Trachea: No tracheal deviation.  Cardiovascular:     Rate and Rhythm: Normal rate.     Pulses: Normal pulses.     Heart sounds: Normal heart sounds.  Pulmonary:     Effort: Pulmonary effort is normal. No respiratory distress.     Breath sounds: No stridor. Rhonchi present.  Musculoskeletal:     Right lower leg: Edema present.     Left lower leg: Edema present.  Skin:    General: Skin is warm and dry.     Findings: No rash.  Neurological:     Mental Status: He is alert.     Motor: No abnormal muscle tone.      Coordination: Coordination normal.  Psychiatric:        Mood and Affect: Mood normal.        Behavior: Behavior normal. Behavior is cooperative.        Results for orders placed or performed in visit on 07/21/23  Lipid Profile   Collection Time: 07/21/23  9:41 AM  Result Value Ref Range   Cholesterol 136 <200 mg/dL   HDL 42 > OR = 40 mg/dL   Triglycerides 86 <849 mg/dL   LDL Cholesterol (Calc) 77 mg/dL (calc)   Total CHOL/HDL Ratio 3.2 <5.0 (calc)   Non-HDL Cholesterol (Calc) 94 <869 mg/dL (calc)  CBC with Differential/Platelet   Collection Time: 07/21/23  9:41 AM  Result Value Ref Range   WBC 8.1 3.8 - 10.8 Thousand/uL   RBC 4.42 4.20 - 5.80 Million/uL   Hemoglobin 18.2 (H) 13.2 - 17.1 g/dL   HCT 49.5 (H) 61.4 - 49.9 %   MCV 114.0 (H) 80.0 - 100.0 fL   MCH 41.2 (H) 27.0 - 33.0 pg   MCHC 36.1 (H) 32.0 - 36.0 g/dL   RDW 86.1 88.9 - 84.9 %   Platelets 191 140 - 400 Thousand/uL   MPV 9.5 7.5 - 12.5 fL   Neutro Abs 5,249 1,500 - 7,800 cells/uL   Absolute Lymphocytes 2,138 850 - 3,900 cells/uL   Absolute Monocytes 437 200 - 950 cells/uL   Eosinophils Absolute 227 15 - 500 cells/uL   Basophils Absolute 49 0 - 200 cells/uL   Neutrophils Relative % 64.8 %   Total Lymphocyte 26.4 %   Monocytes Relative 5.4 %   Eosinophils Relative 2.8 %   Basophils Relative 0.6 %  COMPLETE METABOLIC PANEL WITH GFR   Collection Time: 07/21/23  9:41 AM  Result Value Ref Range   Glucose, Bld 134 (H) 65 - 99 mg/dL   BUN 9 7 - 25 mg/dL  Creat 0.72 0.70 - 1.28 mg/dL   eGFR 95 > OR = 60 fO/fpw/8.26f7   BUN/Creatinine Ratio SEE NOTE: 6 - 22 (calc)   Sodium 139 135 - 146 mmol/L   Potassium 3.5 3.5 - 5.3 mmol/L   Chloride 98 98 - 110 mmol/L   CO2 29 20 - 32 mmol/L   Calcium  8.4 (L) 8.6 - 10.3 mg/dL   Total Protein 6.6 6.1 - 8.1 g/dL   Albumin 3.6 3.6 - 5.1 g/dL   Globulin 3.0 1.9 - 3.7 g/dL (calc)   AG Ratio 1.2 1.0 - 2.5 (calc)   Total Bilirubin 1.4 (H) 0.2 - 1.2 mg/dL   Alkaline  phosphatase (APISO) 148 (H) 35 - 144 U/L   AST 52 (H) 10 - 35 U/L   ALT 27 9 - 46 U/L  B12 and Folate Panel   Collection Time: 07/21/23  9:41 AM  Result Value Ref Range   Vitamin B-12 548 200 - 1,100 pg/mL   Folate 2.6 (L) ng/mL  Vitamin B1   Collection Time: 07/21/23  9:41 AM  Result Value Ref Range   Vitamin B1 (Thiamine ) <6 (L) 8 - 30 nmol/L  Hemoglobin A1c   Collection Time: 07/21/23  9:41 AM  Result Value Ref Range   Hgb A1c MFr Bld 6.5 (H) <5.7 % of total Hgb   Mean Plasma Glucose 140 mg/dL   eAG (mmol/L) 7.7 mmol/L  TEST AUTHORIZATION   Collection Time: 07/21/23  9:41 AM  Result Value Ref Range   TEST NAME: HEMOGLOBIN A1c WITH eAG    TEST CODE: [id number]    CLIENT CONTACT: DR.Teegan Brandis    REPORT ALWAYS MESSAGE SIGNATURE        Assessment & Plan:   Problem List Items Addressed This Visit     Macrocytosis without anemia (Chronic)   Relevant Medications   folic acid  (FOLVITE ) 1 MG tablet   Other Relevant Orders   B12 and Folate Panel   CBC with Differential/Platelet   Vitamin B1   Ambulatory referral to Hematology / Oncology   Mixed hyperlipidemia   Limited statin tolerance  On atorvastatin  every other day, labs checked 6 months ago, refills on meds today Continue med, diet/lifestyle efforts      Relevant Medications   benazepril  (LOTENSIN ) 40 MG tablet   atorvastatin  (LIPITOR) 10 MG tablet   Essential hypertension - Primary   Pt managed on benazepril  40 mg with BP near goal today, no changes, continue med, diet/lifestyle efforts BP Readings from Last 3 Encounters:  01/30/24 134/72  09/27/23 133/74  08/22/23 126/74        Relevant Medications   benazepril  (LOTENSIN ) 40 MG tablet   atorvastatin  (LIPITOR) 10 MG tablet   Elevated hemoglobin (HCC)   With macrocytosis, previously seeing hematology, pt did not do his f/up Recheck labs today - recommend rescheduling appt with hematology Likely multifactorial - OSA w/o CPAP compliance      Relevant Orders    CBC with Differential/Platelet   Ambulatory referral to Hematology / Oncology   Alcohol  dependence, daily use (HCC)   Pt continues to drink same amount of alcohol  daily - several shots of vodka Encouraged to reduce use      Relevant Orders   CBC with Differential/Platelet   Thoracic aortic aneurysm without rupture (HCC)   Reviewed most recent imaging this year per vascular specialists - continue annual imaging/monitoring      Relevant Medications   benazepril  (LOTENSIN ) 40 MG tablet   atorvastatin  (LIPITOR) 10 MG  tablet   Osteoarthritis of knee   Relevant Medications   pregabalin  (LYRICA ) 25 MG capsule   Prediabetes   New onset DM with last labs, pt is on statin and ACEI DM foot exam done today as well as additional labs and pt referred for DM eye exam Explained goals to keep A1c under 7.0 Currently not on meds Recent pertinent labs: Lab Results  Component Value Date   HGBA1C 6.5 (H) 07/21/2023   HGBA1C 5.9 (H) 01/19/2023   HGBA1C 5.7 (H) 07/09/2022         Folate deficiency   Not supplementing and no decrease in ETOH use      Relevant Medications   folic acid  (FOLVITE ) 1 MG tablet   Other Relevant Orders   CBC with Differential/Platelet   Ambulatory referral to Hematology / Oncology   Right hip pain   OA/chronic hip pain now managed by Dr. Lorelle at [facility]. Reviewed his last note and imaging, injection done June - pt to follow up with Dr. DELENA.  He only got brief pain relief from hip injection and is hesitant about hip replacement      Relevant Medications   pregabalin  (LYRICA ) 25 MG capsule   Other Visit Diagnoses       Pain of right lower extremity       previously worked up with labs, ABI, consulted ortho specialists managing now with ortho [facility]   Relevant Medications   pregabalin  (LYRICA ) 25 MG capsule     Callus of toe       Relevant Orders   Ambulatory referral to Podiatry     Skin lesion       several raised flat dry skin lesions, chest,  arms, refer to derm - see photo in chart   Relevant Orders   Ambulatory referral to Dermatology      Needs f/up with hem Referral for derm and podiatry F/up with ortho for continue pain  Assessment and Plan Assessment & Plan Type 2 diabetes mellitus Newly diagnosed based on elevated A1c. Requires management to prevent complications. - Recheck A1c today. - Order urine sample for kidney function assessment. - Perform diabetic foot exam. - Refer to ophthalmologist for diabetic eye exam.  Hypertension Chronic hypertension managed with Benazepril .  Hyperlipidemia Mixed hyperlipidemia managed with Atorvastatin .  Alcohol  dependence with folate deficiency and macrocytosis Chronic alcohol  dependence causing folate deficiency and macrocytosis. - Recheck labs for folate, thiamine , and B12 levels.  Osteoarthritis of both knees Chronic osteoarthritis of both knees.  Right hip pain Chronic pain managed with injection. Hip replacement considered if injections fail. - Consider follow-up with orthopedic specialist if pain persists. - Discuss potential hip replacement surgery if injections are ineffective.  Actinic keratosis (suspected) Suspected actinic keratosis requires dermatological evaluation. - Refer to dermatology for evaluation and possible biopsy or cryotherapy.  Recording duration: 17 minutes    Time based billing, multiple uncontrolled or new chronic conditions (new DM, alcoholism, deficiencies, abnormal labs, acute and chronic pain, PAD, thoracic aneurism, multiple specialists visits reviewed, imaging reviewed, lost to f/up with specialists and today multiple acute issues taking more than 45 min today with pt in exam room.     Return for 4 month routine f/up HTN, HLD, abnormal labs new onset DM.   Michelene Cower, PA-C 01/30/24 10:37 AM

## 2024-01-30 NOTE — Assessment & Plan Note (Signed)
 With macrocytosis, previously seeing hematology, pt did not do his f/up Recheck labs today - recommend rescheduling appt with hematology Likely multifactorial - OSA w/o CPAP compliance

## 2024-01-30 NOTE — Assessment & Plan Note (Signed)
 Reviewed most recent imaging this year per vascular specialists - continue annual imaging/monitoring

## 2024-02-02 ENCOUNTER — Ambulatory Visit: Payer: Self-pay | Admitting: Family Medicine

## 2024-02-02 DIAGNOSIS — R7989 Other specified abnormal findings of blood chemistry: Secondary | ICD-10-CM

## 2024-02-02 DIAGNOSIS — K828 Other specified diseases of gallbladder: Secondary | ICD-10-CM

## 2024-02-02 DIAGNOSIS — E538 Deficiency of other specified B group vitamins: Secondary | ICD-10-CM

## 2024-02-02 DIAGNOSIS — K760 Fatty (change of) liver, not elsewhere classified: Secondary | ICD-10-CM

## 2024-02-02 DIAGNOSIS — D7589 Other specified diseases of blood and blood-forming organs: Secondary | ICD-10-CM

## 2024-02-02 DIAGNOSIS — R17 Unspecified jaundice: Secondary | ICD-10-CM

## 2024-02-03 LAB — CBC WITH DIFFERENTIAL/PLATELET
Absolute Lymphocytes: 2436 {cells}/uL (ref 850–3900)
Absolute Monocytes: 412 {cells}/uL (ref 200–950)
Basophils Absolute: 84 {cells}/uL (ref 0–200)
Basophils Relative: 1 %
Eosinophils Absolute: 151 {cells}/uL (ref 15–500)
Eosinophils Relative: 1.8 %
HCT: 49.8 % (ref 38.5–50.0)
Hemoglobin: 17.3 g/dL — ABNORMAL HIGH (ref 13.2–17.1)
MCH: 40.1 pg — ABNORMAL HIGH (ref 27.0–33.0)
MCHC: 34.7 g/dL (ref 32.0–36.0)
MCV: 115.5 fL — ABNORMAL HIGH (ref 80.0–100.0)
MPV: 9.5 fL (ref 7.5–12.5)
Monocytes Relative: 4.9 %
Neutro Abs: 5317 {cells}/uL (ref 1500–7800)
Neutrophils Relative %: 63.3 %
Platelets: 166 Thousand/uL (ref 140–400)
RBC: 4.31 Million/uL (ref 4.20–5.80)
RDW: 13.7 % (ref 11.0–15.0)
Total Lymphocyte: 29 %
WBC: 8.4 Thousand/uL (ref 3.8–10.8)

## 2024-02-03 LAB — COMPREHENSIVE METABOLIC PANEL WITH GFR
AG Ratio: 1.3 (calc) (ref 1.0–2.5)
ALT: 42 U/L (ref 9–46)
AST: 90 U/L — ABNORMAL HIGH (ref 10–35)
Albumin: 3.5 g/dL — ABNORMAL LOW (ref 3.6–5.1)
Alkaline phosphatase (APISO): 140 U/L (ref 35–144)
BUN/Creatinine Ratio: 17 (calc) (ref 6–22)
BUN: 11 mg/dL (ref 7–25)
CO2: 27 mmol/L (ref 20–32)
Calcium: 8.4 mg/dL — ABNORMAL LOW (ref 8.6–10.3)
Chloride: 94 mmol/L — ABNORMAL LOW (ref 98–110)
Creat: 0.64 mg/dL — ABNORMAL LOW (ref 0.70–1.28)
Globulin: 2.7 g/dL (ref 1.9–3.7)
Glucose, Bld: 114 mg/dL — ABNORMAL HIGH (ref 65–99)
Potassium: 3.7 mmol/L (ref 3.5–5.3)
Sodium: 134 mmol/L — ABNORMAL LOW (ref 135–146)
Total Bilirubin: 1.9 mg/dL — ABNORMAL HIGH (ref 0.2–1.2)
Total Protein: 6.2 g/dL (ref 6.1–8.1)
eGFR: 99 mL/min/1.73m2 (ref >=60)

## 2024-02-03 LAB — VITAMIN B1: Vitamin B1 (Thiamine): <6 nmol/L — ABNORMAL LOW (ref 8–30)

## 2024-02-03 LAB — HEMOGLOBIN A1C
Hgb A1c MFr Bld: 5.7 % — ABNORMAL HIGH (ref <5.7)
Mean Plasma Glucose: 117 mg/dL
eAG (mmol/L): 6.5 mmol/L

## 2024-02-03 LAB — MICROALBUMIN / CREATININE URINE RATIO
Creatinine, Urine: 180 mg/dL (ref 20–320)
Microalb Creat Ratio: 2 mg/g{creat} (ref <30)
Microalb, Ur: 0.4 mg/dL

## 2024-02-03 LAB — B12 AND FOLATE PANEL
Folate: 2.5 ng/mL — ABNORMAL LOW
Vitamin B-12: 469 pg/mL (ref 200–1100)

## 2024-02-09 ENCOUNTER — Encounter: Payer: Self-pay | Admitting: Podiatry

## 2024-02-09 ENCOUNTER — Ambulatory Visit (INDEPENDENT_AMBULATORY_CARE_PROVIDER_SITE_OTHER): Admitting: Podiatry

## 2024-02-09 VITALS — Ht 72.0 in | Wt 223.0 lb

## 2024-02-09 DIAGNOSIS — M79674 Pain in right toe(s): Secondary | ICD-10-CM | POA: Diagnosis not present

## 2024-02-09 DIAGNOSIS — M79675 Pain in left toe(s): Secondary | ICD-10-CM

## 2024-02-09 DIAGNOSIS — B351 Tinea unguium: Secondary | ICD-10-CM | POA: Diagnosis not present

## 2024-02-09 NOTE — Progress Notes (Signed)
  Subjective:  Patient ID: James Medina, male    DOB: [birth year],  MRN: [id number]  Chief Complaint  Patient presents with   Nail Problem    RFC.   78 y.o. male returns for the above complaint.  Patient presents with thickened elongated dystrophic mycotic toenails x 10 mild pain on palpation arch with ambulation worse with pressure show he would like for me debride and is not able to do himself denies any other acute complaints  Objective:  There were no vitals filed for this visit. Podiatric Exam: Vascular: dorsalis pedis and posterior tibial pulses are palpable bilateral. Capillary return is immediate. Temperature gradient is WNL. Skin turgor WNL  Sensorium: Normal Semmes Weinstein monofilament test. Normal tactile sensation bilaterally. Nail Exam: Pt has thick disfigured discolored nails with subungual debris noted bilateral entire nail hallux through fifth toenails.  Pain on palpation to the nails. Ulcer Exam: There is no evidence of ulcer or pre-ulcerative changes or infection. Orthopedic Exam: Muscle tone and strength are WNL. No limitations in general ROM. No crepitus or effusions noted.  Skin: No Porokeratosis. No infection or ulcers    Assessment & Plan:   1. Pain due to onychomycosis of toenails of both feet     Patient was evaluated and treated and all questions answered.  Onychomycosis with pain  -Nails palliatively debrided as below. -Educated on self-care  Procedure: Nail Debridement Rationale: pain  Type of Debridement: manual, sharp debridement. Instrumentation: Nail nipper, rotary burr. Number of Nails: 10  Procedures and Treatment: Consent by patient was obtained for treatment procedures. The patient understood the discussion of treatment and procedures well. All questions were answered thoroughly reviewed. Debridement of mycotic and hypertrophic toenails, 1 through 5 bilateral and clearing of subungual debris. No ulceration, no infection noted.  Return  Visit-Office Procedure: Patient instructed to return to the office for a follow up visit 3 months for continued evaluation and treatment.  Franky Blanch, DPM    No follow-ups on file.

## 2024-02-10 ENCOUNTER — Ambulatory Visit
Admission: RE | Admit: 2024-02-10 | Discharge: 2024-02-10 | Disposition: A | Discharge status: Home / Self Care | Source: Ambulatory Visit | Attending: Family Medicine | Admitting: Family Medicine

## 2024-02-10 DIAGNOSIS — K7689 Other specified diseases of liver: Secondary | ICD-10-CM | POA: Diagnosis not present

## 2024-02-10 DIAGNOSIS — F1010 Alcohol abuse, uncomplicated: Secondary | ICD-10-CM | POA: Diagnosis not present

## 2024-02-10 DIAGNOSIS — R7989 Other specified abnormal findings of blood chemistry: Secondary | ICD-10-CM | POA: Insufficient documentation

## 2024-02-10 DIAGNOSIS — K838 Other specified diseases of biliary tract: Secondary | ICD-10-CM | POA: Diagnosis not present

## 2024-02-10 DIAGNOSIS — R17 Unspecified jaundice: Secondary | ICD-10-CM | POA: Diagnosis not present

## 2024-02-15 DIAGNOSIS — R17 Unspecified jaundice: Secondary | ICD-10-CM | POA: Insufficient documentation

## 2024-02-15 DIAGNOSIS — K828 Other specified diseases of gallbladder: Secondary | ICD-10-CM | POA: Insufficient documentation

## 2024-02-15 DIAGNOSIS — K760 Fatty (change of) liver, not elsewhere classified: Secondary | ICD-10-CM | POA: Insufficient documentation

## 2024-02-15 NOTE — Progress Notes (Signed)
 James Medina  I am writing to let you know that I have reviewed your results:  The liver ultrasound did show signs concerning for development of liver disease which can be alcoholic or steatosis (fatty liver disease).  Additional testing or consult with GI is recommended if you continue to have worsening labs. It also shows some sludge in the gallbladder which can suggest gallbladder dysfunction or biliary dyskinesia - meaning bile salts may not be efficiently ejected out of the gallbladder when needed for digestion.  If you develop any nausea, pain or bowel changes associated with eating or particularly with eating fatty foods, please let me know there  is a different test/evaluation recommended.  Overall same recommendations, please seriously consider decreasing alcohol  and liquor content, amount and frequency to avoid risk of cirrhosis.  Avoid fatty fried foods.  Follow up if you have new pain or GI symptoms particularly associated with eating or right after eating.  And keep your next follow up appointment.    Please send me back a mychart message if you have any questions or concerns.  Michelene Cower, PA-C  Added to problem list elevated bili, hepatic steatosis, gallbladder sludge and pt f/up and recommendations clearly given again esp regarding ETOH The primary encounter diagnosis was Elevated LFTs. Diagnoses of Elevated bilirubin, Folate deficiency, Macrocytosis without anemia, Hepatic steatosis, and Gallbladder sludge were also pertinent to this visit.

## 2024-02-27 ENCOUNTER — Inpatient Hospital Stay: Attending: Oncology | Admitting: Oncology

## 2024-02-27 ENCOUNTER — Inpatient Hospital Stay

## 2024-02-27 ENCOUNTER — Encounter: Payer: Self-pay | Admitting: Oncology

## 2024-02-27 VITALS — BP 144/85 | HR 86 | Temp 97.5°F | Resp 18 | Wt 227.9 lb

## 2024-02-27 DIAGNOSIS — D7589 Other specified diseases of blood and blood-forming organs: Secondary | ICD-10-CM | POA: Diagnosis not present

## 2024-02-27 DIAGNOSIS — D751 Secondary polycythemia: Secondary | ICD-10-CM

## 2024-02-27 DIAGNOSIS — F1090 Alcohol use, unspecified, uncomplicated: Secondary | ICD-10-CM | POA: Insufficient documentation

## 2024-02-27 DIAGNOSIS — E538 Deficiency of other specified B group vitamins: Secondary | ICD-10-CM | POA: Insufficient documentation

## 2024-02-27 DIAGNOSIS — G4730 Sleep apnea, unspecified: Secondary | ICD-10-CM

## 2024-02-27 LAB — CBC WITH DIFFERENTIAL/PLATELET
Abs Immature Granulocytes: 0.03 K/uL (ref 0.00–0.07)
Basophils Absolute: 0.1 K/uL (ref 0.0–0.1)
Basophils Relative: 1 %
Eosinophils Absolute: 0.1 K/uL (ref 0.0–0.5)
Eosinophils Relative: 2 %
HCT: 45 % (ref 39.0–52.0)
Hemoglobin: 15.9 g/dL (ref 13.0–17.0)
Immature Granulocytes: 0 %
Lymphocytes Relative: 24 %
Lymphs Abs: 1.8 K/uL (ref 0.7–4.0)
MCH: 39.8 pg — ABNORMAL HIGH (ref 26.0–34.0)
MCHC: 35.3 g/dL (ref 30.0–36.0)
MCV: 112.8 fL — ABNORMAL HIGH (ref 80.0–100.0)
Monocytes Absolute: 0.5 K/uL (ref 0.1–1.0)
Monocytes Relative: 6 %
Neutro Abs: 5.1 K/uL (ref 1.7–7.7)
Neutrophils Relative %: 67 %
Platelets: 141 K/uL — ABNORMAL LOW (ref 150–400)
RBC: 3.99 MIL/uL — ABNORMAL LOW (ref 4.22–5.81)
RDW: 12.7 % (ref 11.5–15.5)
WBC: 7.5 K/uL (ref 4.0–10.5)
nRBC: 0 % (ref 0.0–0.2)

## 2024-02-27 LAB — TECHNOLOGIST SMEAR REVIEW: Plt Morphology: DECREASED

## 2024-02-27 LAB — FOLATE: Folate: 15.5 ng/mL (ref >5.9)

## 2024-02-27 NOTE — Assessment & Plan Note (Signed)
 Plan patient to decrease alcohol  consumption.

## 2024-02-27 NOTE — Assessment & Plan Note (Signed)
Recommend folic acid supplementation

## 2024-02-27 NOTE — Progress Notes (Signed)
 Hematology/Oncology Consult note [phone] [phone]        REFERRING PROVIDER: Leavy Mole, PA-C   CHIEF COMPLAINTS/REASON FOR VISIT:  Evaluation of erythrocytosis   ASSESSMENT & PLAN:   Secondary erythrocytosis Repeat cbc today showed improved hemoglobin and hct.  No need for phlebotomy.    Macrocytosis without anemia Macrocytosis is likely due to folate deficiency and alcohol  use Cannot rule out underlying bone marrow disorders. Monitor.  Folate deficiency Recommend folic acid  supplementation.  Alcohol  use Plan patient to decrease alcohol  consumption.   Orders Placed This Encounter  Procedures   CBC with Differential/Platelet    Standing Status:   Future    Number of Occurrences:   1    Expected Date:   02/27/2024    Expiration Date:   05/27/2024   Folate    Standing Status:   Future    Number of Occurrences:   1    Expected Date:   02/27/2024    Expiration Date:   05/27/2024   Multiple Myeloma Panel (SPEP&IFE w/QIG)    Standing Status:   Future    Number of Occurrences:   1    Expected Date:   02/27/2024    Expiration Date:   05/27/2024   Kappa/lambda light chains    Standing Status:   Future    Number of Occurrences:   1    Expected Date:   02/27/2024    Expiration Date:   05/27/2024   Methylmalonic acid, serum    Standing Status:   Future    Number of Occurrences:   1    Expected Date:   02/27/2024    Expiration Date:   05/27/2024   Technologist smear review    Standing Status:   Future    Number of Occurrences:   1    Expected Date:   02/27/2024    Expiration Date:   02/26/2025    Clinical information::   macrocytosis   Follow up in 3 months.  All questions were answered. The patient knows to call the clinic with any problems, questions or concerns.  Zelphia Cap, MD, PhD [facility] Hematology Oncology 02/27/2024   HISTORY OF PRESENTING ILLNESS:   James Medina is a  78 y.o.  male with PMH listed below was seen in consultation at  the request of  Leavy Mole, PA-C  for evaluation of erythrocytosis Patient was last seen in 2019, erythrocytosis work up showed JAK2 V617F mutation negative, with reflex to other mutations CALR, MPL, JAK 2 Ex 12-15 mutations negative, negative BCR-ABL1. Erythrocytosis was felt to be due to obstructive OSA. He lost follow up after his appointment on 03/31/2018 .  He was referred back to our clinic to establish care of work up and erythrocytosis.  Patient reports that he has not been using CPAP machine.  01/19/23 cbc showed hemoglobin of 20.7, Hct 57.  He denies dizziness, focal deficit, chest pain, SOB.      MEDICAL HISTORY:  Past Medical History:  Diagnosis Date   Alcoholism (HCC) 10/18/2017   ED (erectile dysfunction)    Hyperlipidemia    Hypertension    Impotence    Marijuana use 01/25/2018   OA (osteoarthritis)    hand   Obstructive sleep apnea 07/14/2018   Managed by pulmonologist   Sleep apnea     SURGICAL HISTORY: Past Surgical History:  Procedure Laterality Date   APPENDECTOMY     broken ankle     COLONOSCOPY WITH PROPOFOL  N/A 08/27/2020  Procedure: COLONOSCOPY WITH PROPOFOL ;  Surgeon: Janalyn Keene NOVAK, MD;  Location: [facility] ENDOSCOPY;  Service: Endoscopy;  Laterality: N/A;   COLONOSCOPY WITH PROPOFOL  N/A 08/30/2022   Procedure: COLONOSCOPY WITH PROPOFOL ;  Surgeon: Unk Corinn Skiff, MD;  Location: [facility] ENDOSCOPY;  Service: Gastroenterology;  Laterality: N/A;   FRACTURE SURGERY      SOCIAL HISTORY: Social History   Socioeconomic History   Marital status: Widowed    Spouse name: Not on file   Number of children: 2   Years of education: Not on file   Highest education level: Some college, no degree  Occupational History   Not on file  Tobacco Use   Smoking status: Former    Current packs/day: 0.00    Average packs/day: 1 pack/day for 15.0 years (15.0 ttl pk-yrs)    Types: Cigars, Cigarettes    Start date: 04/06/1997    Quit date: 04/06/2012    Years since  quitting: 11.9   Smokeless tobacco: Current    Types: Chew   Tobacco comments:    One can a day   Vaping Use   Vaping status: Never Used  Substance and Sexual Activity   Alcohol  use: Not Currently    Comment: 2-3 drinks a night (vodka and sprite)   Drug use: Not Currently   Sexual activity: Yes  Other Topics Concern   Not on file  Social History Narrative   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    Social Drivers of Corporate investment banker Strain: Low Risk  (12/01/2023)   Overall Financial Resource Strain (CARDIA)    Difficulty of Paying Living Expenses: Not hard at all  Food Insecurity: No Food Insecurity (12/01/2023)   Hunger Vital Sign    Worried About Running Out of Food in the Last Year: Never true    Ran Out of Food in the Last Year: Never true  Transportation Needs: No Transportation Needs (12/01/2023)   PRAPARE - Administrator, Civil Service (Medical): No    Lack of Transportation (Non-Medical): No  Physical Activity: Insufficiently Active (12/01/2023)   Exercise Vital Sign    Days of Exercise per Week: 2 days    Minutes of Exercise per Session: 20 min  Stress: No Stress Concern Present (12/01/2023)   [employer] of Occupational Health - Occupational Stress Questionnaire    Feeling of Stress: Not at all  Social Connections: Socially Isolated (12/01/2023)   Social Connection and Isolation Panel    Frequency of Communication with Friends and Family: More than three times a week    Frequency of Social Gatherings with Friends and Family: Never    Attends Religious Services: Never    Database administrator or Organizations: No    Attends Banker Meetings: Never    Marital Status: Widowed  Intimate Partner Violence: Not At Risk (12/01/2023)   Humiliation, Afraid, Rape, and Kick questionnaire    Fear of Current or Ex-Partner: No    Emotionally Abused: No    Physically Abused: No    Sexually Abused: No     FAMILY HISTORY: Family History  Problem Relation Age of Onset   Cancer Mother        leukemia   Leukemia Mother    Heart disease Father    Heart disease Sister        pacemaker   Diabetes Brother    Stroke Brother    Diabetes Brother    Learning disabilities Maternal  Uncle    Cancer Maternal Grandmother    Breast cancer Maternal Grandmother    Emphysema Maternal Grandfather    COPD Neg Hx    Hypertension Neg Hx     ALLERGIES:  has no known allergies.  MEDICATIONS:  Current Outpatient Medications  Medication Sig Dispense Refill   atorvastatin  (LIPITOR) 10 MG tablet TAKE 1 TABLET BY MOUTH EVERY OTHER DAY IN THE EVENING 45 tablet 1   benazepril  (LOTENSIN ) 40 MG tablet Take 1 tablet (40 mg total) by mouth daily. 90 tablet 1   colchicine  0.6 MG tablet Take two pills at the onset of a gout attack; take one more pill one hour later 3 tablet 5   folic acid  (FOLVITE ) 1 MG tablet Take 1 tablet (1 mg total) by mouth daily. 90 tablet 1   meloxicam  (MOBIC ) 15 MG tablet TAKE 1 TABLET BY MOUTH DAILY 30 tablet 1   pregabalin  (LYRICA ) 25 MG capsule Take 1-2 capsules (25-50 mg total) by mouth 2 (two) times daily as needed (msk pain). 180 capsule 1   sildenafil  (VIAGRA ) 25 MG tablet Take 1-2 tablets by mouth daily as needed for erectile dysfunction. 10 tablet 2   thiamine  (VITAMIN B1) 100 MG tablet Take 1 tablet (100 mg total) by mouth daily. 90 tablet 0   No current facility-administered medications for this visit.    Review of Systems  Constitutional:  Positive for fatigue. Negative for appetite change, chills, fever and unexpected weight change.  HENT:   Negative for hearing loss and voice change.   Eyes:  Negative for eye problems and icterus.  Respiratory:  Negative for chest tightness, cough and shortness of breath.   Cardiovascular:  Negative for chest pain and leg swelling.  Gastrointestinal:  Negative for abdominal distention and abdominal pain.  Endocrine: Negative for hot  flashes.  Genitourinary:  Negative for difficulty urinating, dysuria and frequency.   Musculoskeletal:  Negative for arthralgias.  Skin:  Negative for itching and rash.  Neurological:  Negative for light-headedness and numbness.  Hematological:  Negative for adenopathy. Does not bruise/bleed easily.  Psychiatric/Behavioral:  Negative for confusion.    PHYSICAL EXAMINATION: Vitals:   02/27/24 1426 02/27/24 1435  BP: (!) 153/92 (!) 144/85  Pulse: 86   Resp: 18   Temp: (!) 97.5 F (36.4 C)   SpO2: 98%    Filed Weights   02/27/24 1426  Weight: 227 lb 14.4 oz (103.4 kg)    Physical Exam Constitutional:      General: He is not in acute distress. HENT:     Head: Normocephalic and atraumatic.  Eyes:     General: No scleral icterus. Cardiovascular:     Rate and Rhythm: Normal rate and regular rhythm.     Heart sounds: Normal heart sounds.  Pulmonary:     Effort: Pulmonary effort is normal. No respiratory distress.     Breath sounds: No wheezing.  Abdominal:     General: Bowel sounds are normal. There is no distension.     Palpations: Abdomen is soft.  Musculoskeletal:        General: No deformity. Normal range of motion.     Cervical back: Normal range of motion and neck supple.  Lymphadenopathy:     Cervical: No cervical adenopathy.  Skin:    General: Skin is warm and dry.     Findings: No rash.  Neurological:     Mental Status: He is alert and oriented to person, place, and time. Mental status is at  baseline.     Cranial Nerves: No cranial nerve deficit.  Psychiatric:        Mood and Affect: Mood normal.     LABORATORY DATA:  I have reviewed the data as listed    Latest Ref Rng & Units 02/27/2024    3:06 PM 01/30/2024   11:04 AM 07/21/2023    9:41 AM  CBC  WBC 4.0 - 10.5 K/uL 7.5  8.4  8.1   Hemoglobin 13.0 - 17.0 g/dL 84.0  82.6  81.7   Hematocrit 39.0 - 52.0 % 45.0  49.8  50.4   Platelets 150 - 400 K/uL 141  166  191       Latest Ref Rng & Units 01/30/2024    11:04 AM 07/21/2023    9:41 AM 01/19/2023    9:29 AM  CMP  Glucose 65 - 99 mg/dL 885  865  865   BUN 7 - 25 mg/dL 11  9  5    Creatinine 0.70 - 1.28 mg/dL 9.35  9.27  9.34   Sodium 135 - 146 mmol/L 134  139  136   Potassium 3.5 - 5.3 mmol/L 3.7  3.5  3.4   Chloride 98 - 110 mmol/L 94  98  95   CO2 20 - 32 mmol/L 27  29  30    Calcium  8.6 - 10.3 mg/dL 8.4  8.4  8.8   Total Protein 6.1 - 8.1 g/dL 6.2  6.6  6.9   Total Bilirubin 0.2 - 1.2 mg/dL 1.9  1.4  1.1   AST 10 - 35 U/L 90  52  45   ALT 9 - 46 U/L 42  27  23       RADIOGRAPHIC STUDIES: I have personally reviewed the radiological images as listed and agreed with the findings in the report. US  ABDOMEN LIMITED RUQ (LIVER/GB) Result Date: 02/14/2024 CLINICAL DATA:  Worsening liver enzymes. Elevated bilirubin. History of alcohol  abuse. EXAM: ULTRASOUND ABDOMEN LIMITED RIGHT UPPER QUADRANT COMPARISON:  11/12/2019 FINDINGS: Gallbladder: Gallstones: None Sludge: Present Gallbladder Wall: Within normal limits Pericholecystic fluid: None Sonographic Murphy's Sign: Negative per technologist Common bile duct: Diameter: 3 mm Liver: Parenchymal echogenicity: Diffusely echogenic Contours: Normal Lesions: None Portal vein: Patent.  Hepatopetal flow Other: None. IMPRESSION: 1. Diffuse increased echogenicity of the hepatic parenchyma is a nonspecific indicator of hepatocellular dysfunction, most commonly steatosis. 2. Minimal gallbladder sludge. Electronically Signed   By: Aliene Lloyd M.D.   On: 02/14/2024 13:57

## 2024-02-27 NOTE — Assessment & Plan Note (Signed)
 Macrocytosis is likely due to folate deficiency and alcohol  use Cannot rule out underlying bone marrow disorders. Monitor.

## 2024-02-27 NOTE — Assessment & Plan Note (Signed)
Repeat cbc today showed improved hemoglobin and hct.  No need for phlebotomy.

## 2024-02-28 LAB — KAPPA/LAMBDA LIGHT CHAINS
Kappa free light chain: 24.1 mg/L — ABNORMAL HIGH (ref 3.3–19.4)
Kappa, lambda light chain ratio: 0.83 (ref 0.26–1.65)
Lambda free light chains: 28.9 mg/L — ABNORMAL HIGH (ref 5.7–26.3)

## 2024-02-29 LAB — MULTIPLE MYELOMA PANEL, SERUM
Albumin SerPl Elph-Mcnc: 3 g/dL (ref 2.9–4.4)
Albumin/Glob SerPl: 1.1 (ref 0.7–1.7)
Alpha 1: 0.2 g/dL (ref 0.0–0.4)
Alpha2 Glob SerPl Elph-Mcnc: 0.6 g/dL (ref 0.4–1.0)
B-Globulin SerPl Elph-Mcnc: 1.1 g/dL (ref 0.7–1.3)
Gamma Glob SerPl Elph-Mcnc: 1 g/dL (ref 0.4–1.8)
Globulin, Total: 2.9 g/dL (ref 2.2–3.9)
IgA: 460 mg/dL — ABNORMAL HIGH (ref 61–437)
IgG (Immunoglobin G), Serum: 1147 mg/dL (ref 603–1613)
IgM (Immunoglobulin M), Srm: 60 mg/dL (ref 15–143)
Total Protein ELP: 5.9 g/dL — ABNORMAL LOW (ref 6.0–8.5)

## 2024-02-29 LAB — METHYLMALONIC ACID, SERUM: Methylmalonic Acid, Quantitative: 120 nmol/L (ref 0–378)

## 2024-03-06 ENCOUNTER — Ambulatory Visit: Payer: Self-pay | Admitting: Oncology

## 2024-03-06 DIAGNOSIS — D7589 Other specified diseases of blood and blood-forming organs: Secondary | ICD-10-CM

## 2024-03-06 DIAGNOSIS — D751 Secondary polycythemia: Secondary | ICD-10-CM

## 2024-03-07 NOTE — Telephone Encounter (Signed)
-----   Message from Zelphia Cap sent at 03/06/2024 10:50 PM EDT ----- Please let patient know that blood work showed normal folate level, work up results are good. The size of red blood cells is still enlarged, could be due to alcohol  use or underlying bone marrow  disorders. Please advise pt to continue folic acid  supplementation. Follow up in 6 months, lab prior to MD please order cbc b12, folate, tech smear ----- Message ----- From: Rebecka, Lab In [place] Sent: 02/27/2024   3:54 PM EDT To: Zelphia Cap, MD

## 2024-03-07 NOTE — Telephone Encounter (Signed)
 Spoke to James Medina and informed him of MD recommendation. Pt verbalized understanding.   Please schedule and notify pt of appt:   6 months: lab prior to MD (cbc, b12, folate, tech smear)

## 2024-04-09 ENCOUNTER — Other Ambulatory Visit: Payer: Self-pay | Admitting: Family Medicine

## 2024-04-09 DIAGNOSIS — M25551 Pain in right hip: Secondary | ICD-10-CM

## 2024-04-09 DIAGNOSIS — M170 Bilateral primary osteoarthritis of knee: Secondary | ICD-10-CM

## 2024-04-10 NOTE — Telephone Encounter (Signed)
 Requested medication (s) are due for refill today: Yes  Requested medication (s) are on the active medication list: Yes  Last refill:    Future visit scheduled: Yes  Notes to clinic:  Manual review.    Requested Prescriptions  Pending Prescriptions Disp Refills   meloxicam  (MOBIC ) 15 MG tablet [Pharmacy Med Name: meloxicam  15 mg tablet] 30 tablet 1    Sig: TAKE 1 TABLET BY MOUTH DAILY     Analgesics:  COX2 Inhibitors Failed - 04/10/2024  3:48 PM      Failed - Manual Review: Labs are only required if the patient has taken medication for more than 8 weeks.      Failed - Cr in normal range and within 360 days    Creat  Date Value Ref Range Status  01/30/2024 0.64 (L) 0.70 - 1.28 mg/dL Final   Creatinine, Urine  Date Value Ref Range Status  01/30/2024 180 20 - 320 mg/dL Final         Failed - AST in normal range and within 360 days    AST  Date Value Ref Range Status  01/30/2024 90 (H) 10 - 35 U/L Final         Passed - HGB in normal range and within 360 days    Hemoglobin  Date Value Ref Range Status  02/27/2024 15.9 13.0 - 17.0 g/dL Final  96/82/7982 84.9 12.6 - 17.7 g/dL Final         Passed - HCT in normal range and within 360 days    HCT  Date Value Ref Range Status  02/27/2024 45.0 39.0 - 52.0 % Final   Hematocrit  Date Value Ref Range Status  09/05/2015 44.4 37.5 - 51.0 % Final         Passed - ALT in normal range and within 360 days    ALT  Date Value Ref Range Status  01/30/2024 42 9 - 46 U/L Final         Passed - eGFR is 30 or above and within 360 days    GFR, Est African American  Date Value Ref Range Status  08/06/2020 101 > OR = 60 mL/min/1.15m2 Final   GFR, Est Non African American  Date Value Ref Range Status  08/06/2020 87 > OR = 60 mL/min/1.19m2 Final   eGFR  Date Value Ref Range Status  01/30/2024 99 > OR = 60 mL/min/1.85m2 Final         Passed - Patient is not pregnant      Passed - Valid encounter within last 12 months     Recent Outpatient Visits           2 months ago Essential hypertension   [facility] Leavy Mole, PA-C   7 months ago Osteoarthritis of both knees, unspecified osteoarthritis type   [facility] Leavy Mole, PA-C       Future Appointments             In 3 weeks Paci, Rufus HERO, MD [facility]   In 1 month Leavy Mole, PA-C [facility] [place]

## 2024-05-03 ENCOUNTER — Ambulatory Visit: Admitting: Dermatology

## 2024-05-03 ENCOUNTER — Encounter: Payer: Self-pay | Admitting: Dermatology

## 2024-05-03 VITALS — BP 112/63 | HR 94

## 2024-05-03 DIAGNOSIS — L578 Other skin changes due to chronic exposure to nonionizing radiation: Secondary | ICD-10-CM | POA: Diagnosis not present

## 2024-05-03 DIAGNOSIS — L570 Actinic keratosis: Secondary | ICD-10-CM

## 2024-05-03 DIAGNOSIS — W908XXA Exposure to other nonionizing radiation, initial encounter: Secondary | ICD-10-CM

## 2024-05-03 DIAGNOSIS — L820 Inflamed seborrheic keratosis: Secondary | ICD-10-CM

## 2024-05-03 NOTE — Patient Instructions (Addendum)

## 2024-05-03 NOTE — Progress Notes (Signed)
 New Patient Visit   Subjective  James Medina is a 78 y.o. male who presents for the following: growth  Pt has 2 spots he'd like evaluated. He has no hx of skin cancer. He has a lesion on his chest, present for several months, itchy and bothersome, not previously treated.   He has a thick lesion on his right forearm, present for several months, itchy and bothersome, not previously treated.  The following portions of the chart were reviewed this encounter and updated as appropriate: medications, allergies, medical history  Review of Systems:  No other skin or systemic complaints except as noted in HPI or Assessment and Plan.  Objective  Well appearing patient in no apparent distress; mood and affect are within normal limits.  A focused examination was performed of the following areas: Chest and right arm  Relevant exam findings are noted in the Assessment and Plan.  Chest - Medial [facility]) Erythematous thin papules/macules with gritty scale.  Right Forearm - Anterior Inflamed stuck on plaque  Assessment & Plan   ACTINIC KERATOSIS Exam: Erythematous thin papules/macules with gritty scale  Actinic keratoses are precancerous spots that appear secondary to cumulative UV radiation exposure/sun exposure over time. They are chronic with expected duration over 1 year. A portion of actinic keratoses will progress to squamous cell carcinoma of the skin. It is not possible to reliably predict which spots will progress to skin cancer and so treatment is recommended to prevent development of skin cancer.  Recommend daily broad spectrum sunscreen SPF 30+ to sun-exposed areas, reapply every 2 hours as needed.  Recommend staying in the shade or wearing long sleeves, sun glasses (UVA+UVB protection) and wide brim hats (4-inch brim around the entire circumference of the hat). Call for new or changing lesions.  Treatment Plan:  Prior to procedure, discussed risks of blister formation, small  wound, skin dyspigmentation, or rare scar following cryotherapy. Recommend Vaseline ointment to treated areas while healing.  Destruction Procedure Note Destruction method: cryotherapy   Informed consent: discussed and consent obtained   Lesion destroyed using liquid nitrogen: Yes   Outcome: patient tolerated procedure well with no complications   Post-procedure details: wound care instructions given   Locations: mid chest # of Lesions Treated: 1  ACTINIC DAMAGE - chronic, secondary to cumulative UV radiation exposure/sun exposure over time - diffuse scaly erythematous macules with underlying dyspigmentation - Recommend daily broad spectrum sunscreen SPF 30+ to sun-exposed areas, reapply every 2 hours as needed.  - Recommend staying in the shade or wearing long sleeves, sun glasses (UVA+UVB protection) and wide brim hats (4-inch brim around the entire circumference of the hat). - Call for new or changing lesions.  AK (ACTINIC KERATOSIS) Chest - Medial (Center) Destruction of lesion - Chest - Medial (Center) Complexity: simple   Destruction method: cryotherapy   Informed consent: discussed and consent obtained   Timeout:  patient name, date of birth, surgical site, and procedure verified Outcome: patient tolerated procedure well with no complications   Post-procedure details: wound care instructions given    INFLAMED SEBORRHEIC KERATOSIS Right Forearm - Anterior Destruction of lesion - Right Forearm - Anterior Complexity: simple   Destruction method: cryotherapy   Informed consent: discussed and consent obtained   Timeout:  patient name, date of birth, surgical site, and procedure verified Outcome: patient tolerated procedure well with no complications   Post-procedure details: wound care instructions given     Return for TBSE with brenda.  LILLETTE Darice Smock, CMA, am  acting as scribe for RUFUS CHRISTELLA HOLY, MD.   Documentation: I have reviewed the above documentation for accuracy  and completeness, and I agree with the above.  RUFUS CHRISTELLA HOLY, MD

## 2024-05-10 ENCOUNTER — Ambulatory Visit: Admitting: Podiatry

## 2024-05-10 DIAGNOSIS — M205X1 Other deformities of toe(s) (acquired), right foot: Secondary | ICD-10-CM

## 2024-05-10 NOTE — Progress Notes (Signed)
  Subjective:  Patient ID: James Medina, male    DOB: 12-30-1945,  MRN: [id number]  Chief Complaint  Patient presents with   Nail Problem   78 y.o. male returns today for planned flexor tenotomy of the right third digit digit.  Objective:  There were no vitals filed for this visit.  General AA&O x3. Normal mood and affect.  Vascular Pedal pulses palpable.  Neurologic Epicritic sensation grossly intact.  Dermatologic Pre-ulcerative callus at the tip of the right, 3rd toe  Orthopedic: Semi-reducible hammertoe deformity right, 3rd toe    Assessment & Plan:  Patient was evaluated and treated and all questions answered.  Hammertoe right third digit with pre-ulcerative callus -Flexor tenotomy as below. -Advised to remove the dressing in 24 hours and apply a band-aid and triple abx ointment every day thereafter.  Procedure: Flexor Tenotomy Indication for Procedure: toe with semi-reducible hammertoe with distal tip ulceration. Flexor tenotomy indicated to alleviate contracture, reduce pressure, and enhance healing of the ulceration. Location: right, 3rd toe Anesthesia: Lidocaine  1% plain; 1.5 mL and Marcaine  0.5% plain; 1.5 mL digital block Instrumentation: 18 g needle  Technique: The toe was anesthetized as above and prepped in the usual fashion. The toe was exsanquinated and a tourniquet was secured at the base of the toe. An 18g needle was then used to percutaneously release the flexor tendon at the plantar surface of the toe with noted release of the hammertoe deformity. The incision was then dressed with antibiotic ointment and band-aid. Compression splint dressing applied. Patient tolerated the procedure well. Dressing: Dry, sterile, compression dressing. Disposition: Patient tolerated procedure well. Patient to return in 1 week for follow-up.      No follow-ups on file.

## 2024-05-28 ENCOUNTER — Ambulatory Visit: Admitting: Family Medicine

## 2024-05-31 ENCOUNTER — Ambulatory Visit: Admitting: Family Medicine

## 2024-07-19 ENCOUNTER — Encounter: Admitting: Nurse Practitioner

## 2024-08-09 ENCOUNTER — Ambulatory Visit: Admitting: Podiatry

## 2024-09-04 ENCOUNTER — Other Ambulatory Visit

## 2024-09-05 ENCOUNTER — Encounter: Admitting: Nurse Practitioner

## 2024-09-06 ENCOUNTER — Ambulatory Visit: Admitting: Oncology

## 2024-09-10 ENCOUNTER — Ambulatory Visit: Admitting: Physician Assistant

## 2024-12-13 ENCOUNTER — Ambulatory Visit
